# Patient Record
Sex: Male | Born: 1960
Health system: Southern US, Community
[De-identification: ages and names within clinical notes are randomized; demographics above are authoritative.]

## PROBLEM LIST (undated history)

## (undated) DIAGNOSIS — I2510 Atherosclerotic heart disease of native coronary artery without angina pectoris: Secondary | ICD-10-CM

## (undated) DIAGNOSIS — I5020 Unspecified systolic (congestive) heart failure: Secondary | ICD-10-CM

## (undated) DIAGNOSIS — M519 Unspecified thoracic, thoracolumbar and lumbosacral intervertebral disc disorder: Secondary | ICD-10-CM

## (undated) DIAGNOSIS — IMO0001 Reserved for inherently not codable concepts without codable children: Secondary | ICD-10-CM

## (undated) DIAGNOSIS — I219 Acute myocardial infarction, unspecified: Secondary | ICD-10-CM

## (undated) DIAGNOSIS — E785 Hyperlipidemia, unspecified: Secondary | ICD-10-CM

## (undated) DIAGNOSIS — I10 Essential (primary) hypertension: Secondary | ICD-10-CM

## (undated) DIAGNOSIS — F17200 Nicotine dependence, unspecified, uncomplicated: Secondary | ICD-10-CM

## (undated) HISTORY — PX: CARDIAC CATHETERIZATION: SHX172

## (undated) HISTORY — PX: LUMBAR LAMINECTOMY: SHX95

## (undated) HISTORY — PX: KNEE SURGERY: SHX244

---

## 1999-05-05 ENCOUNTER — Emergency Department (HOSPITAL_COMMUNITY): Admission: EM | Admit: 1999-05-05 | Discharge: 1999-05-05 | Payer: Self-pay | Admitting: Emergency Medicine

## 2001-09-30 ENCOUNTER — Emergency Department (HOSPITAL_COMMUNITY): Admission: EM | Admit: 2001-09-30 | Discharge: 2001-10-01 | Payer: Self-pay | Admitting: Emergency Medicine

## 2003-10-26 ENCOUNTER — Inpatient Hospital Stay (HOSPITAL_COMMUNITY): Admission: RE | Admit: 2003-10-26 | Discharge: 2003-10-28 | Payer: Self-pay | Admitting: Neurosurgery

## 2003-12-25 ENCOUNTER — Encounter: Admission: RE | Admit: 2003-12-25 | Discharge: 2003-12-25 | Payer: Self-pay | Admitting: Neurosurgery

## 2004-02-14 ENCOUNTER — Encounter: Admission: RE | Admit: 2004-02-14 | Discharge: 2004-02-14 | Payer: Self-pay | Admitting: Neurosurgery

## 2005-07-07 IMAGING — CR DG LUMBAR SPINE 2-3V
3 series · 3 of 3 positions shown · non-contrast
Comparison: none

CLINICAL DATA: Low back pain.  History of recent lumbar spine surgery 10/26/03. 
 THREE VIEW LUMBAR SPINE: 
 Comparison is to intraoperative and post operative films from 10/26/02.  There are posterior pedicle screws and rods fusing L4-5.  There is an interbody bone plug which appears partially receded into the L5 vertebral body which is a stable finding.  There is mild anterolisthesis of L4 compared to L3.  L4 and L5 are in good alignment.  There is disc space narrowing at L5-S1 and there are calcifications in the distal aorta noted.

[view not recorded (1 of 3)]
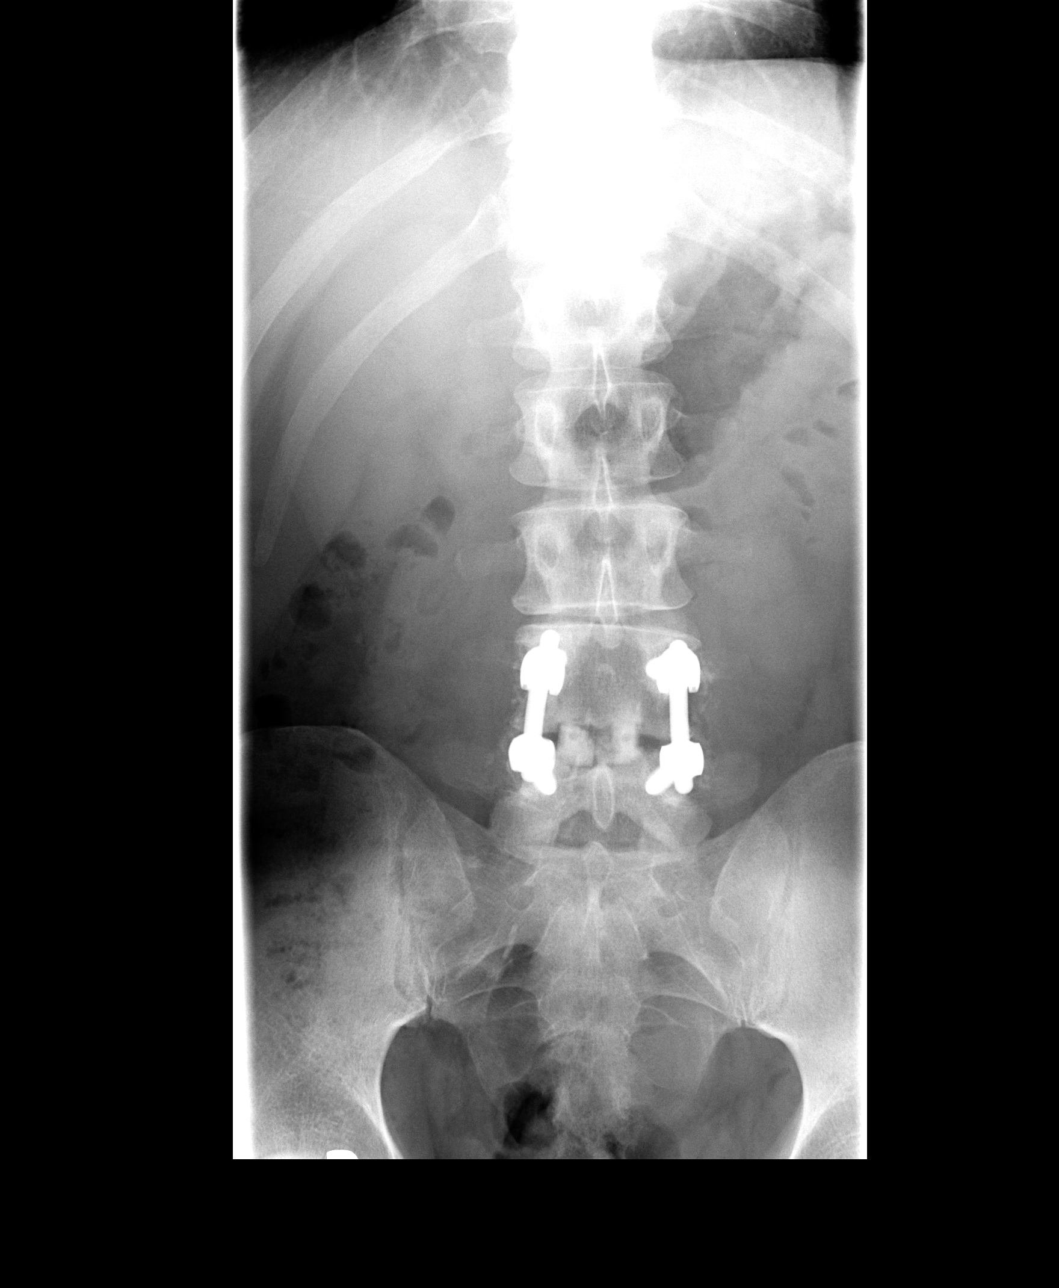

[view not recorded (2 of 3)]
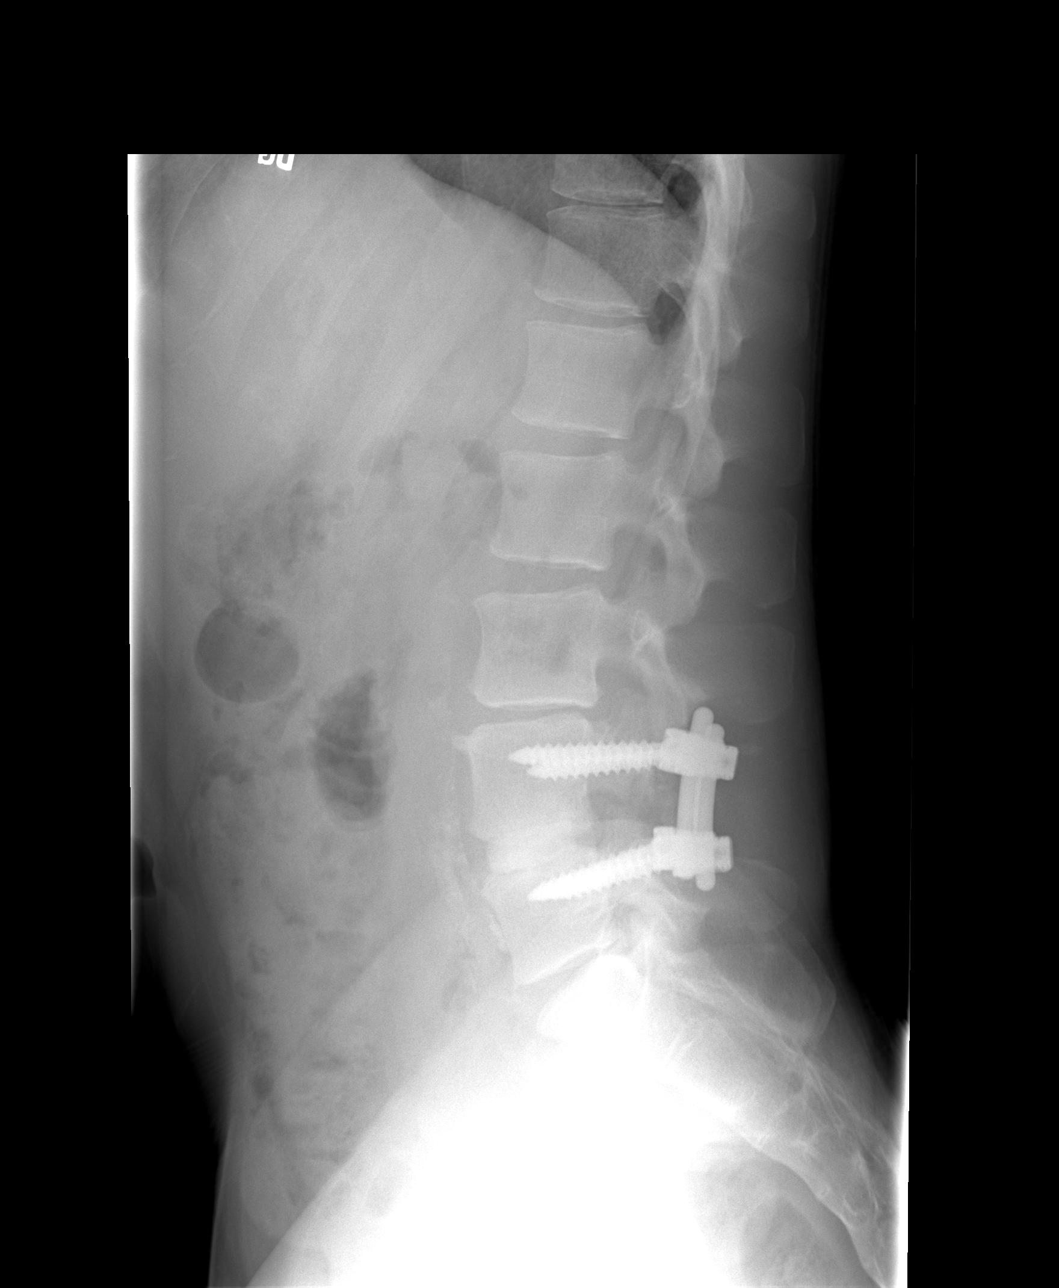

[view not recorded (3 of 3)]
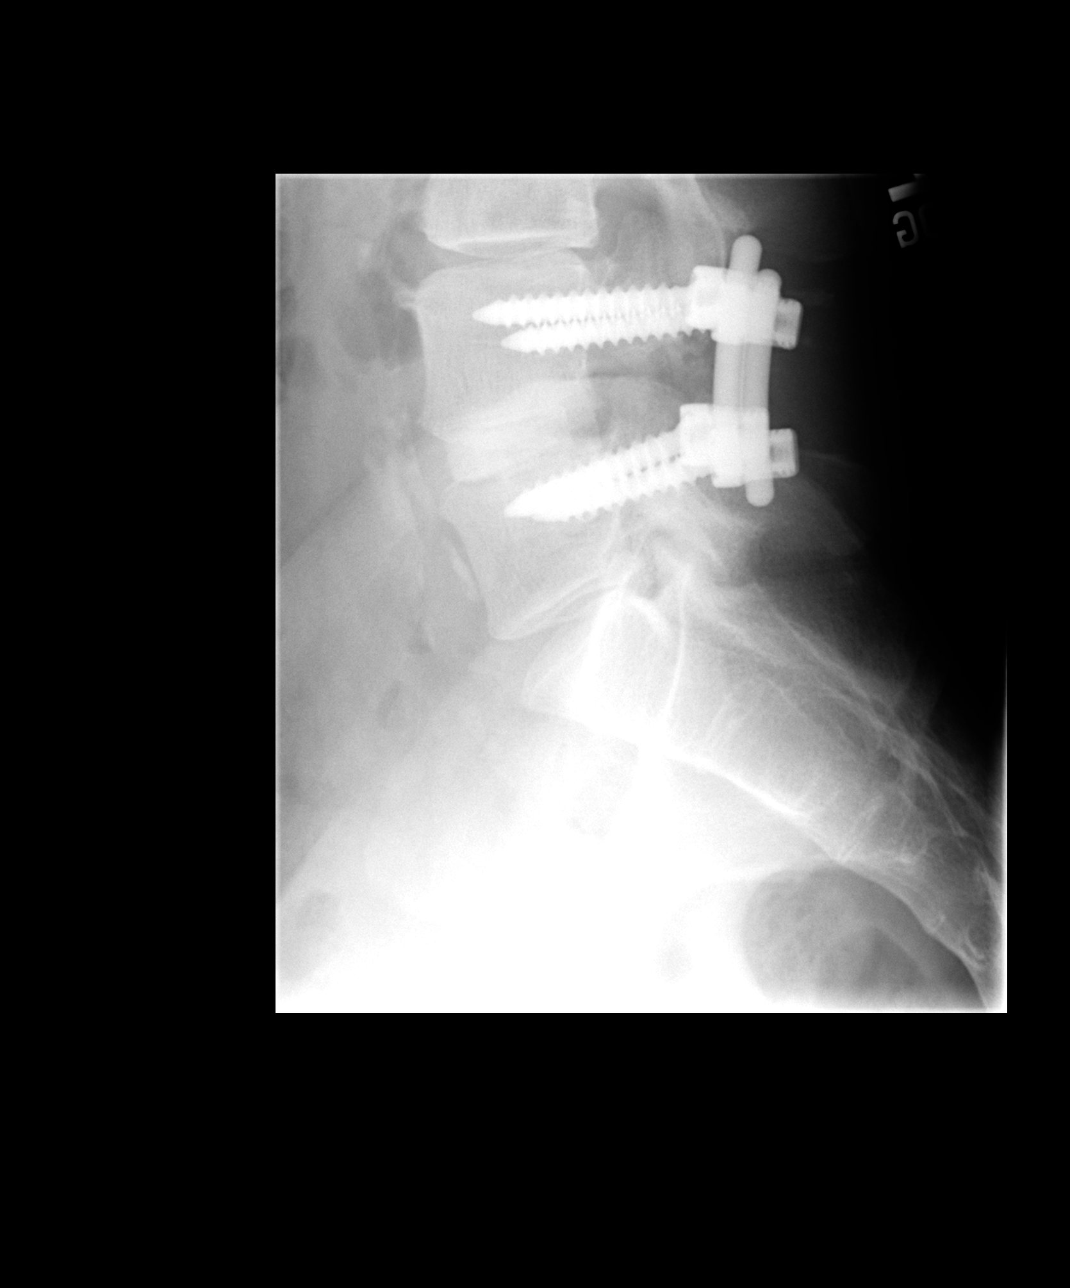

[3 of 3 positions shown; findings below may reference images not displayed]

IMPRESSION: 1.  L4-5 posterior fusion. 
 2.  Mild anterolisthesis of L4 compared to L3. 
 3.  Mild disc space narrowing at L3-4 and L5-S1.

## 2017-04-25 ENCOUNTER — Encounter (HOSPITAL_COMMUNITY): Payer: Self-pay | Admitting: Emergency Medicine

## 2017-04-25 ENCOUNTER — Encounter (HOSPITAL_COMMUNITY)
Admission: EM | Disposition: A | Discharge status: Home / Self Care | Payer: Self-pay | Source: Home / Self Care | Attending: Cardiovascular Disease

## 2017-04-25 ENCOUNTER — Emergency Department (HOSPITAL_COMMUNITY): Payer: Self-pay

## 2017-04-25 ENCOUNTER — Inpatient Hospital Stay (HOSPITAL_COMMUNITY)
Admission: EM | Admit: 2017-04-25 | Discharge: 2017-04-28 | DRG: 246 | Disposition: A | Discharge status: Home / Self Care | Payer: Self-pay | Attending: Cardiovascular Disease | Admitting: Cardiovascular Disease

## 2017-04-25 DIAGNOSIS — I10 Essential (primary) hypertension: Secondary | ICD-10-CM

## 2017-04-25 DIAGNOSIS — Z8249 Family history of ischemic heart disease and other diseases of the circulatory system: Secondary | ICD-10-CM

## 2017-04-25 DIAGNOSIS — I442 Atrioventricular block, complete: Secondary | ICD-10-CM | POA: Diagnosis present

## 2017-04-25 DIAGNOSIS — I2111 ST elevation (STEMI) myocardial infarction involving right coronary artery: Secondary | ICD-10-CM

## 2017-04-25 DIAGNOSIS — I110 Hypertensive heart disease with heart failure: Secondary | ICD-10-CM | POA: Diagnosis present

## 2017-04-25 DIAGNOSIS — E785 Hyperlipidemia, unspecified: Secondary | ICD-10-CM

## 2017-04-25 DIAGNOSIS — I2119 ST elevation (STEMI) myocardial infarction involving other coronary artery of inferior wall: Principal | ICD-10-CM | POA: Diagnosis present

## 2017-04-25 DIAGNOSIS — Z720 Tobacco use: Secondary | ICD-10-CM | POA: Diagnosis present

## 2017-04-25 DIAGNOSIS — Z880 Allergy status to penicillin: Secondary | ICD-10-CM

## 2017-04-25 DIAGNOSIS — R570 Cardiogenic shock: Secondary | ICD-10-CM

## 2017-04-25 DIAGNOSIS — I5021 Acute systolic (congestive) heart failure: Secondary | ICD-10-CM

## 2017-04-25 DIAGNOSIS — I219 Acute myocardial infarction, unspecified: Secondary | ICD-10-CM

## 2017-04-25 DIAGNOSIS — I2510 Atherosclerotic heart disease of native coronary artery without angina pectoris: Secondary | ICD-10-CM

## 2017-04-25 DIAGNOSIS — F17210 Nicotine dependence, cigarettes, uncomplicated: Secondary | ICD-10-CM | POA: Diagnosis present

## 2017-04-25 DIAGNOSIS — Z955 Presence of coronary angioplasty implant and graft: Secondary | ICD-10-CM

## 2017-04-25 HISTORY — PX: CORONARY/GRAFT ACUTE MI REVASCULARIZATION: CATH118305

## 2017-04-25 HISTORY — DX: Essential (primary) hypertension: I10

## 2017-04-25 HISTORY — DX: Acute myocardial infarction, unspecified: I21.9

## 2017-04-25 HISTORY — DX: Unspecified thoracic, thoracolumbar and lumbosacral intervertebral disc disorder: M51.9

## 2017-04-25 HISTORY — DX: Unspecified systolic (congestive) heart failure: I50.20

## 2017-04-25 HISTORY — DX: Reserved for inherently not codable concepts without codable children: IMO0001

## 2017-04-25 HISTORY — DX: Atherosclerotic heart disease of native coronary artery without angina pectoris: I25.10

## 2017-04-25 HISTORY — DX: Hyperlipidemia, unspecified: E78.5

## 2017-04-25 HISTORY — PX: LEFT HEART CATH AND CORONARY ANGIOGRAPHY: CATH118249

## 2017-04-25 HISTORY — DX: Nicotine dependence, unspecified, uncomplicated: F17.200

## 2017-04-25 LAB — I-STAT CHEM 8, ED
BUN: 21 mg/dL — AB (ref 6–20)
CHLORIDE: 104 mmol/L (ref 101–111)
CREATININE: 1.3 mg/dL — AB (ref 0.61–1.24)
Calcium, Ion: 1.06 mmol/L — ABNORMAL LOW (ref 1.15–1.40)
Glucose, Bld: 155 mg/dL — ABNORMAL HIGH (ref 65–99)
HEMATOCRIT: 52 % (ref 39.0–52.0)
Hemoglobin: 17.7 g/dL — ABNORMAL HIGH (ref 13.0–17.0)
POTASSIUM: 3.7 mmol/L (ref 3.5–5.1)
Sodium: 138 mmol/L (ref 135–145)
TCO2: 22 mmol/L (ref 22–32)

## 2017-04-25 LAB — POCT I-STAT TROPONIN I: Troponin i, poc: 0.01 ng/mL (ref 0.00–0.08)

## 2017-04-25 LAB — TROPONIN I: Troponin I: 20.8 ng/mL (ref <0.03)

## 2017-04-25 LAB — POCT ACTIVATED CLOTTING TIME: Activated Clotting Time: 681 seconds

## 2017-04-25 LAB — MRSA PCR SCREENING: MRSA BY PCR: NEGATIVE

## 2017-04-25 SURGERY — LEFT HEART CATH AND CORONARY ANGIOGRAPHY
Anesthesia: LOCAL

## 2017-04-25 MED ORDER — TICAGRELOR 90 MG PO TABS
ORAL_TABLET | ORAL | Status: AC
  Filled 2017-04-25: qty 2

## 2017-04-25 MED ORDER — SODIUM CHLORIDE 0.9 % IV SOLN
INTRAVENOUS | Status: AC | PRN
  Administered 2017-04-25: 1.75 mg/kg/h via INTRAVENOUS

## 2017-04-25 MED ORDER — BIVALIRUDIN TRIFLUOROACETATE 250 MG IV SOLR
INTRAVENOUS | Status: AC
  Filled 2017-04-25: qty 250

## 2017-04-25 MED ORDER — MORPHINE SULFATE (PF) 10 MG/ML IV SOLN: 2.0000 mg | INTRAVENOUS | Status: DC | PRN

## 2017-04-25 MED ORDER — HYDRALAZINE HCL 20 MG/ML IJ SOLN
5.0000 mg | INTRAMUSCULAR | Status: DC | PRN
  Administered 2017-04-25 (×2): 5 mg via INTRAVENOUS
  Filled 2017-04-25 (×2): qty 1

## 2017-04-25 MED ORDER — FENTANYL CITRATE (PF) 100 MCG/2ML IJ SOLN
INTRAMUSCULAR | Status: DC | PRN
  Administered 2017-04-25 (×2): 25 ug via INTRAVENOUS

## 2017-04-25 MED ORDER — SODIUM CHLORIDE 0.9 % IV SOLN
1.7500 mg/kg/h | INTRAVENOUS | Status: AC
  Administered 2017-04-25 (×2): 1.75 mg/kg/h via INTRAVENOUS
  Filled 2017-04-25: qty 250

## 2017-04-25 MED ORDER — IOPAMIDOL (ISOVUE-370) INJECTION 76%
INTRAVENOUS | Status: AC
  Filled 2017-04-25: qty 125

## 2017-04-25 MED ORDER — IOPAMIDOL (ISOVUE-370) INJECTION 76%
INTRAVENOUS | Status: DC | PRN
  Administered 2017-04-25: 90 mL via INTRA_ARTERIAL

## 2017-04-25 MED ORDER — LIDOCAINE HCL (PF) 1 % IJ SOLN
INTRAMUSCULAR | Status: AC
  Filled 2017-04-25: qty 30

## 2017-04-25 MED ORDER — TIROFIBAN HCL IN NACL 5-0.9 MG/100ML-% IV SOLN
INTRAVENOUS | Status: AC
  Filled 2017-04-25: qty 100

## 2017-04-25 MED ORDER — FENTANYL CITRATE (PF) 100 MCG/2ML IJ SOLN
INTRAMUSCULAR | Status: AC
  Filled 2017-04-25: qty 2

## 2017-04-25 MED ORDER — ATROPINE SULFATE 1 MG/ML IJ SOLN
1.0000 mg | Freq: Once | INTRAMUSCULAR | Status: AC
  Administered 2017-04-25: 1 mg via INTRAVENOUS

## 2017-04-25 MED ORDER — ACETAMINOPHEN 325 MG PO TABS: 650.0000 mg | ORAL_TABLET | ORAL | Status: DC | PRN

## 2017-04-25 MED ORDER — ONDANSETRON HCL 4 MG/2ML IJ SOLN: 4.0000 mg | Freq: Four times a day (QID) | INTRAMUSCULAR | Status: DC | PRN

## 2017-04-25 MED ORDER — SODIUM CHLORIDE 0.9 % IV SOLN
Freq: Once | INTRAVENOUS | Status: AC
  Administered 2017-04-25 (×2): via INTRAVENOUS

## 2017-04-25 MED ORDER — SODIUM CHLORIDE 0.9 % IV SOLN: Freq: Once | INTRAVENOUS | Status: DC

## 2017-04-25 MED ORDER — SODIUM CHLORIDE 0.9% FLUSH
3.0000 mL | Freq: Two times a day (BID) | INTRAVENOUS | Status: DC
  Administered 2017-04-25 – 2017-04-27 (×5): 3 mL via INTRAVENOUS

## 2017-04-25 MED ORDER — TICAGRELOR 90 MG PO TABS
90.0000 mg | ORAL_TABLET | Freq: Two times a day (BID) | ORAL | Status: DC
  Administered 2017-04-26: 90 mg via ORAL
  Filled 2017-04-25: qty 1

## 2017-04-25 MED ORDER — TIROFIBAN HCL IN NACL 5-0.9 MG/100ML-% IV SOLN
INTRAVENOUS | Status: DC | PRN
  Administered 2017-04-25: 0.075 ug/kg/min via INTRAVENOUS

## 2017-04-25 MED ORDER — HEPARIN (PORCINE) IN NACL 2-0.9 UNIT/ML-% IJ SOLN
INTRAMUSCULAR | Status: AC
  Filled 2017-04-25: qty 1000

## 2017-04-25 MED ORDER — NOREPINEPHRINE BITARTRATE 1 MG/ML IV SOLN
INTRAVENOUS | Status: AC
  Filled 2017-04-25: qty 4

## 2017-04-25 MED ORDER — HEPARIN SODIUM (PORCINE) 5000 UNIT/ML IJ SOLN
60.0000 [IU]/kg | Freq: Once | INTRAMUSCULAR | Status: AC
  Administered 2017-04-25: 4000 [IU] via INTRAVENOUS

## 2017-04-25 MED ORDER — HEPARIN (PORCINE) IN NACL 2-0.9 UNIT/ML-% IJ SOLN
INTRAMUSCULAR | Status: AC | PRN
  Administered 2017-04-25: 1000 mL

## 2017-04-25 MED ORDER — TIROFIBAN (AGGRASTAT) BOLUS VIA INFUSION
INTRAVENOUS | Status: DC | PRN
  Administered 2017-04-25: 1565 ug via INTRAVENOUS

## 2017-04-25 MED ORDER — LIDOCAINE HCL (PF) 1 % IJ SOLN
INTRAMUSCULAR | Status: DC | PRN
  Administered 2017-04-25: 15 mL

## 2017-04-25 MED ORDER — TICAGRELOR 90 MG PO TABS
ORAL_TABLET | ORAL | Status: DC | PRN
  Administered 2017-04-25: 180 mg via ORAL

## 2017-04-25 MED ORDER — NOREPINEPHRINE BITARTRATE 1 MG/ML IV SOLN
INTRAVENOUS | Status: DC | PRN
  Administered 2017-04-25: 5 ug/min via INTRAVENOUS

## 2017-04-25 MED ORDER — SODIUM CHLORIDE 0.9 % IV SOLN: INTRAVENOUS | Status: DC

## 2017-04-25 MED ORDER — SODIUM CHLORIDE 0.9% FLUSH
3.0000 mL | INTRAVENOUS | Status: DC | PRN
  Administered 2017-04-26: 3 mL via INTRAVENOUS
  Filled 2017-04-25: qty 3

## 2017-04-25 MED ORDER — BIVALIRUDIN BOLUS VIA INFUSION - CUPID
INTRAVENOUS | Status: DC | PRN
  Administered 2017-04-25: 46.95 mg via INTRAVENOUS

## 2017-04-25 MED ORDER — SODIUM CHLORIDE 0.9 % IV SOLN
Freq: Once | INTRAVENOUS | Status: AC
  Administered 2017-04-25: 18:00:00 via INTRAVENOUS

## 2017-04-25 MED ORDER — ASPIRIN 81 MG PO CHEW
81.0000 mg | CHEWABLE_TABLET | Freq: Every day | ORAL | Status: DC
  Administered 2017-04-26 – 2017-04-28 (×3): 81 mg via ORAL
  Filled 2017-04-25 (×3): qty 1

## 2017-04-25 MED ORDER — LABETALOL HCL 5 MG/ML IV SOLN
10.0000 mg | INTRAVENOUS | Status: DC | PRN
  Filled 2017-04-25: qty 4

## 2017-04-25 MED ORDER — SODIUM CHLORIDE 0.9 % IV SOLN: 250.0000 mL | INTRAVENOUS | Status: DC | PRN

## 2017-04-25 SURGICAL SUPPLY — 20 items
BALLN SAPPHIRE 2.0X12 (BALLOONS) ×2
BALLN SAPPHIRE ~~LOC~~ 3.25X15 (BALLOONS) ×2 IMPLANT
BALLOON SAPPHIRE 2.0X12 (BALLOONS) ×1 IMPLANT
CABLE ADAPT CONN TEMP 6FT (ADAPTER) ×2 IMPLANT
CATH EXTRAC PRONTO 5.5F 138CM (CATHETERS) ×2 IMPLANT
CATH INFINITI 5FR MULTPACK ANG (CATHETERS) ×2 IMPLANT
CATH S G BIP PACING (SET/KITS/TRAYS/PACK) ×2 IMPLANT
CATH VISTA GUIDE 6FR JR4 (CATHETERS) ×2 IMPLANT
KIT ENCORE 26 ADVANTAGE (KITS) ×2 IMPLANT
KIT HEART LEFT (KITS) ×2 IMPLANT
PACK CARDIAC CATHETERIZATION (CUSTOM PROCEDURE TRAY) ×2 IMPLANT
SHEATH PINNACLE 6F 10CM (SHEATH) ×4 IMPLANT
SLEEVE REPOSITIONING LENGTH 30 (MISCELLANEOUS) ×2 IMPLANT
STENT SYNERGY DES 3X20 (Permanent Stent) ×2 IMPLANT
SYR MEDRAD MARK V 150ML (SYRINGE) ×2 IMPLANT
TRANSDUCER W/STOPCOCK (MISCELLANEOUS) ×2 IMPLANT
TUBING CIL FLEX 10 FLL-RA (TUBING) ×2 IMPLANT
WIRE ASAHI PROWATER 180CM (WIRE) ×2 IMPLANT
WIRE EMERALD 3MM-J .035X150CM (WIRE) ×2 IMPLANT
WIRE INTUITION HYDRO ST. 180CM (WIRE) ×2 IMPLANT

## 2017-04-25 NOTE — ED Notes (Signed)
To CATH LAB now!!

## 2017-04-25 NOTE — ED Provider Notes (Addendum)
[facility]-EMERGENCY DEPT Provider Note   CSN: [id number] Arrival date & time: 04/25/17  1728     History   Chief Complaint No chief complaint on file.   HPI Lance Santiago is a 56 y.o. male. Chief complaint is chest pain, code STEMI.  HPI:  56 year old male. History of tobacco abuse with 40+ pack year smoking history, hypertension, and family history of heart disease with his father having an MI. He had onset of chest pain while at rest at 4:30 today while at home. He called 911. They arrived to find him pale. Living of dizziness. No syncope. EKG faxed to me. Shows obvious marked ST elevations inferiorly and her cervical changes laterally. Anterior changes consistent with posterior wall MI.   Prehospital medical direction: Per medics instructed to give fluid bolus and aspirin. Told to hold nitroglycerin due to the nature of this MI.  Past Medical History:  Diagnosis Date  . Hypertension   . Lumbar disc disease     There are no active problems to display for this patient.   Past Surgical History:  Procedure Laterality Date  . KNEE SURGERY    . LUMBAR LAMINECTOMY         Home Medications    Prior to Admission medications   Not on File    Family History Family History  Problem Relation Age of Onset  . Diabetes Mother   . Coronary artery disease Father     Social History Social History  Substance Use Topics  . Smoking status: Current Every Day Smoker    Packs/day: 1.00    Years: 40.00    Types: Cigarettes  . Smokeless tobacco: Never Used  . Alcohol use Not on file     Allergies   Penicillins   Review of Systems Review of Systems  Constitutional: Negative for appetite change, chills, diaphoresis, fatigue and fever.  HENT: Negative for mouth sores, sore throat and trouble swallowing.   Eyes: Negative for visual disturbance.  Respiratory: Negative for cough, chest tightness, shortness of breath and wheezing.   Cardiovascular: Positive for chest pain.  Negative for palpitations and leg swelling.  Gastrointestinal: Negative for abdominal distention, abdominal pain, diarrhea, nausea and vomiting.  Endocrine: Negative for polydipsia, polyphagia and polyuria.  Genitourinary: Negative for dysuria, frequency and hematuria.  Musculoskeletal: Negative for gait problem.  Skin: Negative for color change, pallor and rash.  Neurological: Positive for dizziness and light-headedness. Negative for syncope and headaches.  Hematological: Does not bruise/bleed easily.  Psychiatric/Behavioral: Negative for behavioral problems and confusion.     Physical Exam Updated Vital Signs BP (!) 84/50   Pulse (!) 44   Temp (!) 95.7 F (35.4 C) (Temporal)   Resp 15   Ht 5\' 7"  (1.702 m)   Wt 62.6 kg (138 lb)   SpO2 91%   BMI 21.61 kg/m   Physical Exam  Constitutional: He is oriented to person, place, and time. No distress.  Thin, pale, diaphoretic.  HENT:  Head: Normocephalic.  Eyes: Pupils are equal, round, and reactive to light. Conjunctivae are normal. No scleral icterus.  Neck: Normal range of motion. Neck supple. No thyromegaly present.  Cardiovascular: Normal rate and regular rhythm.  Exam reveals no gallop and no friction rub.   No murmur heard. No murmur  Pulmonary/Chest: Effort normal and breath sounds normal. No respiratory distress. He has no wheezes. He has no rales.  Clear lungs.  Abdominal: Soft. Bowel sounds are normal. He exhibits no distension. There is no tenderness. There  is no rebound.  Musculoskeletal: Normal range of motion.  Neurological: He is alert and oriented to person, place, and time.  Skin: Skin is warm and dry. No rash noted.  Psychiatric: He has a normal mood and affect. His behavior is normal.     ED Treatments / Results  Labs (all labs ordered are listed, but only abnormal results are displayed) Labs Reviewed  I-STAT CHEM 8, ED - Abnormal; Notable for the following:       Result Value   BUN 21 (*)     Creatinine, Ser 1.30 (*)    Glucose, Bld 155 (*)    Calcium, Ion 1.06 (*)    Hemoglobin 17.7 (*)    All other components within normal limits  CBC WITH DIFFERENTIAL/PLATELET  PROTIME-INR  APTT  COMPREHENSIVE METABOLIC PANEL  TROPONIN I  LIPID PANEL  POCT I-STAT TROPONIN I  I-STAT TROPONIN, ED    EKG  EKG Interpretation None       Radiology No results found.  Procedures Procedures (including critical care time)  Medications Ordered in ED Medications  0.9 %  sodium chloride infusion ( Intravenous Automatically Held 04/25/17 1800)  norepinephrine (LEVOPHED) 4 mg in dextrose 5 % 250 mL (0.016 mg/mL) infusion (5 mcg/min Intravenous New Bag/Given 04/25/17 1801)  lidocaine (PF) (XYLOCAINE) 1 % injection (15 mLs  Given 04/25/17 1804)  heparin injection 3,750 Units (4,000 Units Intravenous Given 04/25/17 1746)  0.9 %  sodium chloride infusion ( Intravenous Stopped 04/25/17 1748)  atropine injection 1 mg (1 mg Intravenous Given 04/25/17 1746)  0.9 %  sodium chloride infusion ( Intravenous New Bag/Given 04/25/17 1748)     Initial Impression / Assessment and Plan / ED Course  I have reviewed the triage vital signs and the nursing notes.  Pertinent labs & imaging results that were available during my care of the patient were reviewed by me and considered in my medical decision making (see chart for details).   EKG shows marked STE in inferior leads, Also STD in Ant and Lateral lead. Third-degree AV block. He may be upon arrival is 6. Patient's at 500 fluid. Given pressure bags an additional 3 L fluid in the ER.  Patient was given aspirin 324 mg prehospital. He had emesis. This was repeated and he has not had additional emesis after Zofran. Given 4 Zofran and morphine 4 mg prior to arrival.  Was given 4000 units IV push heparin here. Given 1 mg atropine IV at the request of Dr. Gery Pray cardiology. No change in heart rate.  Has palpable femoral pulse upon arrival. Maintains consciousness  here is able to communicate. Taken to the Cath Lab by nursing, Dr. Gery Pray, and Dr. Donnie Aho.  CRITICAL CARE Performed by: Rolland Porter JOSEPH   Total critical care time: 20 minutes  Critical care time was exclusive of separately billable procedures and treating other patients.  Critical care was necessary to treat or prevent imminent or life-threatening deterioration.  Critical care was time spent personally by me on the following activities: development of treatment plan with patient and/or surrogate as well as nursing, discussions with consultants, evaluation of patient's response to treatment, examination of patient, obtaining history from patient or surrogate, ordering and performing treatments and interventions, ordering and review of laboratory studies, ordering and review of radiographic studies, pulse oximetry and re-evaluation of patient's condition.   Final Clinical Impressions(s) / ED Diagnoses   Final diagnoses:  STEMI involving right coronary artery (HCC)  Third degree AV block (HCC)  New Prescriptions There are no discharge medications for this patient.    Rolland Porter, MD 04/25/17 Julio Sicks    Rolland Porter, MD 04/25/17 [phone]

## 2017-04-25 NOTE — H&P (Addendum)
History and Physical   Admit date: 04/25/2017 Name:  Lance Santiago Medical record number: [id number] DOB/Age:  1961/04/04  56 y.o. male  Referring Physician:   [name]   Primary Cardiologist:  New to [facility]  Primary Physician:  None currently   Chief complaint/reason for admission: Chest pain   HPI:  This 56 year old male is new to the system and began having substernal chest discomfort approximately an hour ago and was transported to [facility].  An EKG in route shows an acute inferolateral myocardial infarction with ST depression in the high lateral and V2 leads.  He is having ongoing chest pain at the time of admission.  He did not have pain previous to this.  His history is remarkable for family history hypertension as well as smoking.  He was given atropine in the emergency room without effect on his heart rate.   Past Medical History:  Diagnosis Date  . Hypertension   . Lumbar disc disease      Past Surgical History:  Procedure Laterality Date  . KNEE SURGERY    . LUMBAR LAMINECTOMY     Allergies: is allergic to penicillins.   Medications: Prior to Admission medications   He says he is on lisinopril dose unknown  Family History:  family history includes Coronary artery disease in his father; Diabetes in his mother.  Social History:   reports that he has been smoking Cigarettes.  He has a 40.00 pack-year smoking history. He has never used smokeless tobacco.   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  He has adult children and is currently living with a girlfriend.    Review of Systems: Only cursory review of systems able to be done due to the acute nature of the problem.  No history of GI bleeding. Other than as noted above, the remainder of the review of systems is normal  Physical Exam: BP (!) 84/50   Pulse (!) 44   Temp (!) 95.7 F (35.4 C) (Temporal)   Resp 15   Ht 5\' 7"  (1.702 m)   Wt 62.6 kg  (138 lb)   SpO2 91%   BMI 21.61 kg/m    General appearance: He is a dark complected male mildly diaphoretic complaining of chest pain appears ill Head: Normocephalic, without obvious abnormality, atraumatic Eyes: negative  Mouth: Teeth in poor repair Neck: no adenopathy, no carotid bruit, supple, symmetrical, trachea midline and JVD difficult to assess in the supine position Lungs: clear to auscultation bilaterally Heart: Slow regular rhythm, normal S1-S2 no S3 no murmur Abdomen: soft, non-tender; bowel sounds normal; no masses,  no organomegaly Rectal: deferred Extremities: Extremities somewhat cool, no edema, full range of motion Pulses: 1+ femoral and peripheral pulses were palpable Skin: [name] Neurologic: Somewhat dark complexion around the head and neck area  Labs: Pending at the time of admission  EKG: Acute inferior and lateral infarction with significant sinus bradycardia and reciprocal ST depression in the lateral leads Independently reviewed by me  Radiology: Not obtained on admission   IMPRESSIONS: 1.  Acute inferior infarction with hypotension and bradycardia with cardiogenic shock 2.  Hypertension 3.  Tobacco abuse  PLAN: To the cath lab for acute intervention in the setting of an acute infarction.  Further plans by Dr. Allyson Sabal.  Signed: Darden Palmer MD [facility] Cardiology  04/25/2017, 5:58 PM

## 2017-04-25 NOTE — ED Triage Notes (Signed)
Patient arrived by [facility] activated as Code Stemi. Reports chest pain and shortness of breath x 1 hour prior to calling EMS. Patient received 500NS bolus, aspirin 324mg , morphine 4mg  and zofran 4mg  pta. Pain 9/10 on arrival, pale. Alert and oriented. No cardiac hx

## 2017-04-25 NOTE — ED Notes (Signed)
Patient received total of 3500 of NS in ED

## 2017-04-25 NOTE — Progress Notes (Addendum)
CRITICAL VALUE ALERT  Critical Value:  Troponin 20.80  Date & Time Notied:  04/25/17 @ 2059  Provider Notified: Cardiology Donnie Aho) @ 2115  Orders Received/Actions taken: Pt has undergone cath, with current Angiomax gtt.  Continue with current plan of care.Marland KitchenMarland Kitchen

## 2017-04-25 NOTE — Progress Notes (Addendum)
Upon arrival arrival to unit at 1930, pt found to have level "2" hematoma and continuous bleeding at R femoral site.  Manual pressure held for 40 minutes.  Hematoma decreased, but oozing at site continues.  Pt A&Ox4 and  VS stable.  Per MD, continue Anngiomax gtt and pressure dressing applied.  Will continue to monitor pt closely and apply additional pressure as needed.  Frutoso Chase, RN 04/25/17

## 2017-04-26 ENCOUNTER — Encounter (HOSPITAL_COMMUNITY): Payer: Self-pay | Admitting: *Deleted

## 2017-04-26 ENCOUNTER — Inpatient Hospital Stay (HOSPITAL_COMMUNITY): Payer: Self-pay

## 2017-04-26 DIAGNOSIS — R570 Cardiogenic shock: Secondary | ICD-10-CM | POA: Diagnosis present

## 2017-04-26 DIAGNOSIS — Z720 Tobacco use: Secondary | ICD-10-CM

## 2017-04-26 DIAGNOSIS — I219 Acute myocardial infarction, unspecified: Secondary | ICD-10-CM

## 2017-04-26 DIAGNOSIS — I442 Atrioventricular block, complete: Secondary | ICD-10-CM

## 2017-04-26 HISTORY — DX: Tobacco use: Z72.0

## 2017-04-26 LAB — TROPONIN I: Troponin I: >65 ng/mL (ref <0.03)

## 2017-04-26 LAB — LIPID PANEL
Cholesterol: 199 mg/dL (ref 0–200)
HDL: 29 mg/dL — AB (ref >40)
LDL Cholesterol: 120 mg/dL — ABNORMAL HIGH (ref 0–99)
TRIGLYCERIDES: 250 mg/dL — AB (ref <150)
Total CHOL/HDL Ratio: 6.9 RATIO
VLDL: 50 mg/dL — ABNORMAL HIGH (ref 0–40)

## 2017-04-26 LAB — CBC
HEMATOCRIT: 40.7 % (ref 39.0–52.0)
HEMOGLOBIN: 13.8 g/dL (ref 13.0–17.0)
MCH: 31.2 pg (ref 26.0–34.0)
MCHC: 33.9 g/dL (ref 30.0–36.0)
MCV: 92.1 fL (ref 78.0–100.0)
Platelets: 169 10*3/uL (ref 150–400)
RBC: 4.42 MIL/uL (ref 4.22–5.81)
RDW: 14.1 % (ref 11.5–15.5)
WBC: 14.7 10*3/uL — AB (ref 4.0–10.5)

## 2017-04-26 LAB — BASIC METABOLIC PANEL
ANION GAP: 5 (ref 5–15)
BUN: 13 mg/dL (ref 6–20)
CHLORIDE: 108 mmol/L (ref 101–111)
CO2: 23 mmol/L (ref 22–32)
Calcium: 8.4 mg/dL — ABNORMAL LOW (ref 8.9–10.3)
Creatinine, Ser: 1.11 mg/dL (ref 0.61–1.24)
GFR calc Af Amer: >60 mL/min (ref >60)
GLUCOSE: 93 mg/dL (ref 65–99)
POTASSIUM: 3.9 mmol/L (ref 3.5–5.1)
Sodium: 136 mmol/L (ref 135–145)

## 2017-04-26 LAB — ECHOCARDIOGRAM COMPLETE
HEIGHTINCHES: 67 in
Weight: 2156.8 oz

## 2017-04-26 LAB — HEMOGLOBIN A1C
Hgb A1c MFr Bld: 5.7 % — ABNORMAL HIGH (ref 4.8–5.6)
Mean Plasma Glucose: 116.89 mg/dL

## 2017-04-26 MED ORDER — ATROPINE SULFATE 1 MG/10ML IJ SOSY
PREFILLED_SYRINGE | INTRAMUSCULAR | Status: AC
  Filled 2017-04-26: qty 10

## 2017-04-26 MED ORDER — CLOPIDOGREL BISULFATE 75 MG PO TABS
75.0000 mg | ORAL_TABLET | Freq: Every day | ORAL | Status: DC
  Administered 2017-04-27 – 2017-04-28 (×2): 75 mg via ORAL
  Filled 2017-04-26 (×2): qty 1

## 2017-04-26 MED ORDER — ATORVASTATIN CALCIUM 80 MG PO TABS
80.0000 mg | ORAL_TABLET | Freq: Every day | ORAL | Status: DC
  Administered 2017-04-26 – 2017-04-27 (×2): 80 mg via ORAL
  Filled 2017-04-26 (×2): qty 1

## 2017-04-26 MED ORDER — CLOPIDOGREL BISULFATE 300 MG PO TABS
300.0000 mg | ORAL_TABLET | Freq: Once | ORAL | Status: AC
Start: 2017-04-26 — End: 2017-04-26
  Administered 2017-04-26: 300 mg via ORAL
  Filled 2017-04-26: qty 1

## 2017-04-26 MED ORDER — HYDRALAZINE HCL 20 MG/ML IJ SOLN
10.0000 mg | Freq: Once | INTRAMUSCULAR | Status: AC
  Administered 2017-04-26: 10 mg via INTRAVENOUS

## 2017-04-26 MED ORDER — NITROGLYCERIN IN D5W 200-5 MCG/ML-% IV SOLN: 0.0000 ug/min | INTRAVENOUS | Status: DC

## 2017-04-26 NOTE — Progress Notes (Signed)
CARDIAC REHAB PHASE I   PRE:  Rate/Rhythm: 91 SR  BP:  Sitting: 125/80        SaO2: 96 RA  MODE:  Ambulation: 370 ft   POST:  Rate/Rhythm: 95 SR  BP:  Sitting: 133/91         SaO2: 97 RA  Pt ambulated 370 ft on RA, handheld assist, steady gait, tolerated well with no complaints. Began MI/stent education.  Reviewed risk factors, tobacco cessation (declined fake cigarette), MI book, anti-platelet therapy, stent card, activity restrictions, ntg, and phase 2 cardiac rehab. Left heart healthy diet handout for to review. Pt verbalized understanding. Pt agrees to phase 2 cardiac rehab referral, will send to [place] per pt request. Pt states he does not have insurance, states if his medication is not free he will not be able to afford it. Pt also does not have PCP. Pt to see case manager prior to discharge. Pt to recliner after walk, call bell within reach. Will follow.   [phone] Joylene Grapes, RN, BSN 04/26/2017 3:24 PM

## 2017-04-26 NOTE — Progress Notes (Signed)
Progress Note  Patient Name: Lance Santiago Date of Encounter: 04/26/2017  Primary Cardiologist: Quay Burow MD  Subjective   Feels great- denies any chest pain or SOB.   Inpatient Medications    Scheduled Meds: . aspirin  81 mg Oral Daily  . atorvastatin  80 mg Oral q1800  . clopidogrel  300 mg Oral Once  . [START ON 04/27/2017] clopidogrel  75 mg Oral Daily  . sodium chloride flush  3 mL Intravenous Q12H   Continuous Infusions: . sodium chloride     PRN Meds: sodium chloride, acetaminophen, morphine injection, ondansetron (ZOFRAN) IV, sodium chloride flush   Vital Signs    Vitals:   04/26/17 0744 04/26/17 0800 04/26/17 0830 04/26/17 0900  BP:  119/73 115/79 116/82  Pulse:  78 69 77  Resp:  _0 Temp: 98.7 F (37.1 C)     TempSrc: Oral     SpO2:  96% 96% 97%  Weight:      Height:        Intake/Output Summary (Last 24 hours) at 04/26/17 0947 Last data filed at 04/26/17 0730  Gross per 24 hour  Intake          1072.11 ml  Output              850 ml  Net           222.11 ml   Filed Weights   04/25/17 1739 04/26/17 0500  Weight: 138 lb (62.6 kg) 134 lb 12.8 oz (61.1 kg)    Telemetry    NSR. No pacing. No further heart block - Personally Reviewed  ECG    NSR with inferior infarct. ST elevation resolving. - Personally Reviewed  Physical Exam   GEN: No acute distress.   Neck: No JVD or bruits Cardiac: RRR, no murmurs, rubs, or gallops.  Respiratory: Clear to auscultation bilaterally. GI: Soft, nontender, non-distended  MS: No edema; No deformity. Sheath with temp pacer in right groin. No hematoma. Neuro:  Nonfocal  Psych: Normal affect   Labs    Chemistry Recent Labs Lab 04/25/17 1752 04/26/17 0656  NA 138 136  K 3.7 3.9  CL 104 108  CO2  --  23  GLUCOSE 155* 93  BUN 21* 13  CREATININE 1.30* 1.11  CALCIUM  --  8.4*  GFRNONAA  --  >60  GFRAA  --  >60  ANIONGAP  --  5     Hematology Recent Labs Lab 04/25/17 1752  04/26/17 0656  WBC  --  14.7*  RBC  --  4.42  HGB 17.7* 13.8  HCT 52.0 40.7  MCV  --  92.1  MCH  --  31.2  MCHC  --  33.9  RDW  --  14.1  PLT  --  169    Cardiac Enzymes Recent Labs Lab 04/25/17 1950 04/26/17 0050 04/26/17 0656  TROPONINI 20.80* >65.00* >65.00*    Recent Labs Lab 04/25/17 1750  TROPIPOC 0.01     BNPNo results for input(s): BNP, PROBNP in the last 168 hours.   DDimer No results for input(s): DDIMER in the last 168 hours.   Radiology    No results found.  Cardiac Studies   Conclusion     Prox RCA to Mid RCA lesion, 100 %stenosed.  There is mild left ventricular systolic dysfunction.  The left ventricular ejection fraction is 45-50% by visual estimate.  LV end diastolic pressure is moderately elevated.   Lance Santiago  is a 56 y.o. male    [id number] LOCATION:  FACILITY: [facility]  PHYSICIAN: Quay Burow, M.D. 27-Nov-1960   DATE OF PROCEDURE:  04/25/2017  DATE OF DISCHARGE:     CARDIAC CATHETERIZATION / RCA-DES/ TTVPM     History obtained from chart review. Lance Santiago is a 56 year old thin appearing single Caucasian male without prior cardiac history. He does have a history of hypertension and tobacco abuse. He developed sudden onset of chest pain approximately one hour prior to presentation. EMS was called. His EKG showed inferior ST segment elevation with extension to lateral leads and severe bradycardia/heart block. Upon presentation his heart rate was in the mid 30s and his blood pressure was 70. He was brought emergently to the Cath Lab for stabilization, angiography and intervention.   PROCEDURE DESCRIPTION:   The patient was brought to the second floor [place] [facility]iac cath lab in the postabsorptive state. He was not premedicated . His right groin was prepped and shaved in usual sterile fashion. Xylocaine 1% was used for local anesthesia. A 6 French sheath was inserted into the right common femoral  arteryAnd a 6 French sheath was inserted into the right common femoral vein using standard Seldinger technique. A balloon tipped temporary transverse pacemaker was placed in the RV apex with thresholds measured down to less than 5 mA. This was placed at 60 bpm. He was placed on Levothroid for hypotension. Once his heart rate improved to 60's blood pressure did as well. 5 French left Judkins diagnostic catheters along with a 5 French pigtail catheter were used for selective coronary angiography and left ventriculography respectively. Isovue dye was used for the entirety of the case. Retrograde aortic, left ventricular and pullback pressures were recorded.  The patient received Angiomax bolus followed by infusion with a therapeutic ACT demonstrated. He received Proventil 180 mg by mouth load. Isovue was used for the entirety of the intervention. Retrograde pressure was monitored in the case. I fairly easily crossed the lesion with an 014 guidewire and predilated with a 2 mm x 12 mm balloon restoring antegrade flow with a door to balloon time of 20 minutes. There was a large thrombus burden and therefore used a Pronto aspiration thrombectomy catheter removed a copious amount of white thrombus. This markedly improved antegrade flow. In addition, I gave a bolus of Aggrastat. I then stented the lesion with a 3 mm x 20 mm long synergy drug-eluting stent delivered at 18 atm and postdilated with 3.5 x 15 mm long noncompliant balloon at 18 atm (3.4 mm) resulting in reduction of the total occlusion to 0% residual with TIMI-3 flow. There was no visible thrombus. The patient tolerated the procedure well. He was somewhat tachycardic during this time and hypertensive and therefore his Levophed was discontinued. The guidewire and catheter were removed. The sheaths including the temporary transvenous pacemaker was secured in place.   IMPRESSION: Successful RCA PCI and drug-eluting stenting in the setting of a large inferior  wall myocardial infarction. By complete heart block, bradycardia and cardiogenic shock. He was treated with aspiration thrombectomy followed by drug eluding stenting using a synergy millimeter by 20 mm long drug-eluting stent postdilated to 3.4 mm. The door to  to balloon time was 20 minutes. Once antegrade flow was restored blood pressure improved as did his heart rate. Aggrastat was discontinued. Angiomax will continue for 4 hours at full dose. The femoral arterial sheath will be removed. I will maintain the femoral venous sheath and pacemaker until the morning in case  he becomes bradycardic overnight. His EF was in the 45-50% range. He can be fast tracked the most likely discharged on Tuesday or Wednesday. He will be treated with appropriate/usual post-MI pharmacology including low-dose beta blocker, antiplatelet therapy which he will continue for a minimum of 12 months and high-dose statin therapy. He left the lab in stable condition.  Quay Burow. MD, [facility] 04/25/2017 6:53 PM      Indications   Acute ST elevation myocardial infarction (STEMI) involving other coronary artery of inferior wall (HCC) [I21.19 (ICD-10-CM)]  Third degree heart block ([place]) [I44.2 (ICD-10-CM)]  Cardiogenic shock ([place]) [R57.0 (ICD-10-CM)]  Procedural Details/Technique   Technical Details Estimated blood loss <50 mL.  During this procedure no sedation was administered.    Coronary Findings   Dominance: Right  Right Coronary Artery  Prox RCA to Mid RCA lesion, 100% stenosed.  Right Posterior Descending Artery  RPDA filled by collaterals from Mid LAD.  Wall Motion              Left Heart   Left Ventricle The left ventricular size is normal. There is mild left ventricular systolic dysfunction. LV end diastolic pressure is moderately elevated. The left ventricular ejection fraction is 45-50% by visual estimate. There are LV function abnormalities. Mild inferior hypokinesia    Coronary Diagrams    Diagnostic Diagram       Implan    Patient Profile     56 y.o. male with history of tobacco abuse and family history of CAD presents with acute inferior STEMI complicated by cardiogenic shock and CHB.  Assessment & Plan    1. Acute inferior STEMI complicated by cardiogenic shock and CHB. S/p emergent stenting of the RCA with good result. No significant disease in LCA. Troponin peak >65. Ecg improved. Asymptomatic. CHB resolved. BP normal. Will check EF by Echo. Remove temp pacer today. Ambulate. Will switch Brilinta to Plavix since patient has no insurance and is unemployed. On ASA and statin. Hold beta blocker due to CHB. Consider ACEi depending on results of Echo. Case management to assist with medications.  2. Tobacco abuse - patient plans to quit.  3. Family history of CAD 4. Lipid status unknown. Check lipid panel and A1c.  For questions or updates, please contact [facility] [place] Please consult [email or web address] for contact info under Cardiology/STEMI.      Signed, Glennis Montenegro [place], MD  04/26/2017, 9:47 AM

## 2017-04-26 NOTE — Progress Notes (Signed)
  Echocardiogram 2D Echocardiogram has been performed.  Celene Skeen 04/26/2017, 11:42 AM

## 2017-04-26 NOTE — Progress Notes (Signed)
Venous sheath removed.  Manual pressure held for 15 minutes.  Site unremarkable, level 0.  Tegaderm dressing applied to site.  Pt educated on restrictions and bedrest.  Will continue to monitor pt closely.

## 2017-04-26 NOTE — Progress Notes (Signed)
Right femoral sheath pulled at 0205. Manual pressure applied for 25 minutes. Two RNs and atropine at bedside. VS stable throughout. Site level "1." ?   Pt educated on mobility restrictions and potential complications, including when to contact RN.   Pt tolerated procedure well. Will continue to monitor pt and site closely. ?  ----   Site area: Right Femoral Artery   Site Prior to Removal: Level 2 - oozing at insertion site   Pressure Applied For: 25 Mins   Manual: Yes  Patient Status During Pull: Stable   Post Pull Site: Level 1 - mnimal bruising    Post Pull Instructions Given: Yes   Post Pull Pulses Present: DP +1   Dressing Applied: Gauze and pressure tape  Bedrest begins @ 0230

## 2017-04-26 NOTE — Progress Notes (Signed)
CRITICAL VALUE ALERT  Critical Value: Troponin I >65  Date & Time Notied: 04/26/2017  1229  Provider Notified: not notified, expected value  Orders Received/Actions taken: no orders received, expected value post STEMI

## 2017-04-27 DIAGNOSIS — R570 Cardiogenic shock: Secondary | ICD-10-CM

## 2017-04-27 DIAGNOSIS — I2119 ST elevation (STEMI) myocardial infarction involving other coronary artery of inferior wall: Principal | ICD-10-CM

## 2017-04-27 LAB — BASIC METABOLIC PANEL
Anion gap: 5 (ref 5–15)
BUN: 15 mg/dL (ref 6–20)
CHLORIDE: 108 mmol/L (ref 101–111)
CO2: 23 mmol/L (ref 22–32)
Calcium: 8.8 mg/dL — ABNORMAL LOW (ref 8.9–10.3)
Creatinine, Ser: 1.02 mg/dL (ref 0.61–1.24)
GFR calc non Af Amer: >60 mL/min (ref >60)
Glucose, Bld: 92 mg/dL (ref 65–99)
POTASSIUM: 4 mmol/L (ref 3.5–5.1)
SODIUM: 136 mmol/L (ref 135–145)

## 2017-04-27 MED ORDER — LISINOPRIL 10 MG PO TABS
10.0000 mg | ORAL_TABLET | Freq: Every day | ORAL | Status: DC
  Administered 2017-04-27 – 2017-04-28 (×2): 10 mg via ORAL
  Filled 2017-04-27 (×2): qty 1

## 2017-04-27 MED ORDER — NICOTINE 21 MG/24HR TD PT24
21.0000 mg | MEDICATED_PATCH | Freq: Every day | TRANSDERMAL | Status: DC
  Administered 2017-04-27 – 2017-04-28 (×2): 21 mg via TRANSDERMAL
  Filled 2017-04-27 (×2): qty 1

## 2017-04-27 MED FILL — Medication: Qty: 1 | Status: AC

## 2017-04-27 NOTE — Progress Notes (Signed)
Progress Note  Patient Name: Lance Santiago Date of Encounter: 04/27/2017  Primary Cardiologist: Quay Burow MD  Subjective   Feels great- denies any chest pain or SOB. Ambulating in halls.  Inpatient Medications    Scheduled Meds: . aspirin  81 mg Oral Daily  . atorvastatin  80 mg Oral q1800  . clopidogrel  75 mg Oral Daily  . nicotine  21 mg Transdermal Daily  . sodium chloride flush  3 mL Intravenous Q12H   Continuous Infusions: . sodium chloride     PRN Meds: sodium chloride, acetaminophen, morphine injection, ondansetron (ZOFRAN) IV, sodium chloride flush   Vital Signs    Vitals:   04/27/17 0700 04/27/17 0910 04/27/17 1100 04/27/17 1152  BP:  (!) 133/92 (!) 128/92   Pulse: 98 98 92   Resp: 19 (!) 25 (!) 21   Temp:  98.1 F (36.7 C)  97.9 F (36.6 C)  TempSrc:  Oral  Oral  SpO2: 96% 91% 97%   Weight:      Height:        Intake/Output Summary (Last 24 hours) at 04/27/17 1351 Last data filed at 04/27/17 1100  Gross per 24 hour  Intake              240 ml  Output             1525 ml  Net            -1285 ml   Filed Weights   04/25/17 1739 04/26/17 0500  Weight: 138 lb (62.6 kg) 134 lb 12.8 oz (61.1 kg)    Telemetry    NSR. No pacing. No further heart block - Personally Reviewed  ECG    None today. - Personally Reviewed  Physical Exam   GEN: No acute distress.   Neck: No JVD or bruits Cardiac: RRR, no murmurs, rubs, or gallops.  Respiratory: Clear to auscultation bilaterally. GI: Soft, nontender, non-distended  MS: No edema; No deformity. No groin hematoma. Neuro:  Nonfocal  Psych: Normal affect   Labs    Chemistry  Recent Labs Lab 04/25/17 1752 04/26/17 0656 04/27/17 0243  NA 138 136 136  K 3.7 3.9 4.0  CL 104 108 108  CO2  --  23 23  GLUCOSE 155* 93 92  BUN 21* 13 15  CREATININE 1.30* 1.11 1.02  CALCIUM  --  8.4* 8.8*  GFRNONAA  --  >60 >60  GFRAA  --  >60 >60  ANIONGAP  --  5 5     Hematology  Recent  Labs Lab 04/25/17 1752 04/26/17 0656  WBC  --  14.7*  RBC  --  4.42  HGB 17.7* 13.8  HCT 52.0 40.7  MCV  --  92.1  MCH  --  31.2  MCHC  --  33.9  RDW  --  14.1  PLT  --  169    Cardiac Enzymes  Recent Labs Lab 04/25/17 1950 04/26/17 0050 04/26/17 0656  TROPONINI 20.80* >65.00* >65.00*     Recent Labs Lab 04/25/17 1750  TROPIPOC 0.01     BNPNo results for input(s): BNP, PROBNP in the last 168 hours.   DDimer No results for input(s): DDIMER in the last 168 hours.   Radiology    No results found.  Cardiac Studies   Conclusion     Prox RCA to Mid RCA lesion, 100 %stenosed.  There is mild left ventricular systolic dysfunction.  The left ventricular ejection fraction is 45-50%  by visual estimate.  LV end diastolic pressure is moderately elevated.   Lance Santiago is a 56 y.o. male    [id number] LOCATION:  FACILITY: [facility]  PHYSICIAN: Quay Burow, M.D. [birth year]   DATE OF PROCEDURE:  04/25/2017  DATE OF DISCHARGE:     CARDIAC CATHETERIZATION / RCA-DES/ TTVPM     History obtained from chart review. Lance Santiago is a 56 year old thin appearing single Caucasian male without prior cardiac history. He does have a history of hypertension and tobacco abuse. He developed sudden onset of chest pain approximately one hour prior to presentation. EMS was called. His EKG showed inferior ST segment elevation with extension to lateral leads and severe bradycardia/heart block. Upon presentation his heart rate was in the mid 30s and his blood pressure was 70. He was brought emergently to the Cath Lab for stabilization, angiography and intervention.   PROCEDURE DESCRIPTION:   The patient was brought to the second floor [facility] cath lab in the postabsorptive state. He was not premedicated . His right groin was prepped and shaved in usual sterile fashion. Xylocaine 1% was used for local anesthesia. A 6 French sheath was inserted into  the right common femoral arteryAnd a 6 French sheath was inserted into the right common femoral vein using standard Seldinger technique. A balloon tipped temporary transverse pacemaker was placed in the RV apex with thresholds measured down to less than 5 mA. This was placed at 60 bpm. He was placed on Levothroid for hypotension. Once his heart rate improved to 60's blood pressure did as well. 5 French left Judkins diagnostic catheters along with a 5 French pigtail catheter were used for selective coronary angiography and left ventriculography respectively. Isovue dye was used for the entirety of the case. Retrograde aortic, left ventricular and pullback pressures were recorded.  The patient received Angiomax bolus followed by infusion with a therapeutic ACT demonstrated. He received Proventil 180 mg by mouth load. Isovue was used for the entirety of the intervention. Retrograde pressure was monitored in the case. I fairly easily crossed the lesion with an 014 guidewire and predilated with a 2 mm x 12 mm balloon restoring antegrade flow with a door to balloon time of 20 minutes. There was a large thrombus burden and therefore used a Pronto aspiration thrombectomy catheter removed a copious amount of white thrombus. This markedly improved antegrade flow. In addition, I gave a bolus of Aggrastat. I then stented the lesion with a 3 mm x 20 mm long synergy drug-eluting stent delivered at 18 atm and postdilated with 3.5 x 15 mm long noncompliant balloon at 18 atm (3.4 mm) resulting in reduction of the total occlusion to 0% residual with TIMI-3 flow. There was no visible thrombus. The patient tolerated the procedure well. He was somewhat tachycardic during this time and hypertensive and therefore his Levophed was discontinued. The guidewire and catheter were removed. The sheaths including the temporary transvenous pacemaker was secured in place.   IMPRESSION: Successful RCA PCI and drug-eluting stenting in the  setting of a large inferior wall myocardial infarction. By complete heart block, bradycardia and cardiogenic shock. He was treated with aspiration thrombectomy followed by drug eluding stenting using a synergy millimeter by 20 mm long drug-eluting stent postdilated to 3.4 mm. The door to  to balloon time was 20 minutes. Once antegrade flow was restored blood pressure improved as did his heart rate. Aggrastat was discontinued. Angiomax will continue for 4 hours at full dose. The femoral arterial sheath will  be removed. I will maintain the femoral venous sheath and pacemaker until the morning in case he becomes bradycardic overnight. His EF was in the 45-50% range. He can be fast tracked the most likely discharged on Tuesday or Wednesday. He will be treated with appropriate/usual post-MI pharmacology including low-dose beta blocker, antiplatelet therapy which he will continue for a minimum of 12 months and high-dose statin therapy. He left the lab in stable condition.  Quay Burow. MD, [facility] 04/25/2017 6:53 PM      Indications   Acute ST elevation myocardial infarction (STEMI) involving other coronary artery of inferior wall (HCC) [I21.19 (ICD-10-CM)]  Third degree heart block ([place]) [I44.2 (ICD-10-CM)]  Cardiogenic shock ([place]) [R57.0 (ICD-10-CM)]  Procedural Details/Technique   Technical Details Estimated blood loss <50 mL.  During this procedure no sedation was administered.    Coronary Findings   Dominance: Right  Right Coronary Artery  Prox RCA to Mid RCA lesion, 100% stenosed.  Right Posterior Descending Artery  RPDA filled by collaterals from Mid LAD.  Wall Motion              Left Heart   Left Ventricle The left ventricular size is normal. There is mild left ventricular systolic dysfunction. LV end diastolic pressure is moderately elevated. The left ventricular ejection fraction is 45-50% by visual estimate. There are LV function abnormalities. Mild inferior hypokinesia     Coronary Diagrams   Diagnostic Diagram         Echo: Study Conclusions  - Left ventricle: The cavity size was normal. Systolic function was   mildly reduced. The estimated ejection fraction was in the range   of 45% to 50%. Mild hypokinesis of the inferior and inferoseptal   myocardium. Doppler parameters are consistent with abnormal left   ventricular relaxation (grade 1 diastolic dysfunction). - Aortic valve: Transvalvular velocity was within the normal range.   There was no stenosis. There was no regurgitation. - Mitral valve: Transvalvular velocity was within the normal range.   There was no evidence for stenosis. There was trivial   regurgitation. - Right ventricle: The cavity size was normal. Wall thickness was   normal. - Atrial septum: No defect or patent foramen ovale was identified. - Tricuspid valve: There was trivial regurgitation.  Patient Profile     56 y.o. male with history of tobacco abuse and family history of CAD presents with acute inferior STEMI complicated by cardiogenic shock and CHB.  Assessment & Plan    1. Acute inferior STEMI complicated by cardiogenic shock and CHB. S/p emergent stenting of the RCA with good result. No significant disease in LCA. Troponin peak >65. Ecg improved. Asymptomatic. CHB resolved. BP now elevated.  EF 45-50%.  On ASA, plavix, and statin. Hold beta blocker due to CHB. Will add  ACEi for BP and LV dysfunction. Case management to assist with medications.  2. Tobacco abuse - patient plans to quit.  3. Family history of CAD 4. Hyperlipidemia. On statin  Will transfer to telemetry today. Plan DC in am.  For questions or updates, please contact [place] Please consult [email or web address] for contact info under Cardiology/STEMI.      Signed, Viva Gallaher Martin[place], MD  04/27/2017, 1:51 PM

## 2017-04-27 NOTE — Progress Notes (Signed)
CARDIAC REHAB PHASE I   PRE:  Rate/Rhythm: 106 ST  BP:  Sitting: 142/95        SaO2: 98 RA  MODE:  Ambulation: 740 ft   POST:  Rate/Rhythm: 108 ST  BP:  Sitting: 146/111, 137/97 recheck         SaO2: 98 RA  Pt ambulated 740 ft on RA, independent, steady gait, tolerated well with no complaints. Completed MI/stent education. Reviewed yesterday's education, reviewed exercise guidelines, heart healthy diet. Pt verbalized understanding. Phase 2 referral sent to [facility]. Case manager following regarding medication needs. Pt to recliner after walk, call bell within reach.  [phone] Joylene Grapes, RN, BSN 04/27/2017 2:28 PM

## 2017-04-27 NOTE — Care Management Note (Signed)
Case Management Note Donn Pierini RN, BSN Unit 4E-Case Manager-- 2H coverage [phone]  Patient Details  Name: Lance Santiago MRN: [id number] Date of Birth: 1961-04-26  Subjective/Objective:    Pt admitted with STEMI s/p DES- started on Plavix/asa                Action/Plan: PTA pt lived at home with girlfriend- independent- anticipate return home- referral for medication needs- spoke with pt at bedside- per pt he is unemployed and in process of applying for disability- does not have any income- states he can not afford his medications- does not have PCP- has tried to go to [facility] in past but they wanted $80 to be seen and he could not pay this- agreeable to f/u with [facility] and to apply for their sliding scale program vs [facility] HD. Will provide pt with info on each. Call made to [facility] clinic- spoke with Judeth Cornfield- who informed this CM that pt would first need to come in and apply for their Star program before they could make a f/u appointment - (pt was in their system from 3 yrs ago)- application for star program provided to pt. - also discussed pt's medications- looks like he will have 3-4 scripts- ($20-25 which pt reports he does not have money for) is eligible for MATCH- which would be $3 copay per script $12 total for 4- pt states he believes he can borrow that for d/c meds- understands it is a one time use for discharge. MATCH letter provided to pt along with list of pharmacies to use.   Expected Discharge Date:      04/28/17            Expected Discharge Plan:  Home/Self Care  In-House Referral:     Discharge planning Services  CM Consult, Medication Assistance, MATCH Program, Indigent Health Clinic  Post Acute Care Choice:  NA Choice offered to:  NA  DME Arranged:    DME Agency:     HH Arranged:    HH Agency:     Status of Service:  Completed, signed off  If discussed at [employer] of Stay Meetings, dates discussed:    Discharge Disposition: home/self  care   Additional Comments:  Darrold Span, RN 04/27/2017, 4:10 PM

## 2017-04-28 ENCOUNTER — Encounter (HOSPITAL_COMMUNITY): Payer: Self-pay | Admitting: Cardiology

## 2017-04-28 DIAGNOSIS — E7800 Pure hypercholesterolemia, unspecified: Secondary | ICD-10-CM

## 2017-04-28 DIAGNOSIS — E785 Hyperlipidemia, unspecified: Secondary | ICD-10-CM

## 2017-04-28 DIAGNOSIS — I10 Essential (primary) hypertension: Secondary | ICD-10-CM

## 2017-04-28 DIAGNOSIS — I5021 Acute systolic (congestive) heart failure: Secondary | ICD-10-CM

## 2017-04-28 HISTORY — DX: Hyperlipidemia, unspecified: E78.5

## 2017-04-28 MED ORDER — LISINOPRIL 10 MG PO TABS: 10.0000 mg | ORAL_TABLET | Freq: Every day | ORAL | 0 refills | Status: DC

## 2017-04-28 MED ORDER — CLOPIDOGREL BISULFATE 75 MG PO TABS: 75.0000 mg | ORAL_TABLET | Freq: Every day | ORAL | 0 refills | Status: DC

## 2017-04-28 MED ORDER — ATORVASTATIN CALCIUM 80 MG PO TABS: 80.0000 mg | ORAL_TABLET | Freq: Every day | ORAL | 0 refills | Status: DC

## 2017-04-28 MED ORDER — ASPIRIN 81 MG PO CHEW: 81.0000 mg | CHEWABLE_TABLET | Freq: Every day | ORAL | Status: DC

## 2017-04-28 NOTE — Progress Notes (Signed)
Pt discharging to home with "girlfriend" discharge instructions given and verbalizes understanding of information from CM regarding obtaining medications

## 2017-04-28 NOTE — Discharge Summary (Signed)
Discharge Summary    Patient ID: TRAIVON MORRICAL,  MRN: [id number], DOB/AGE: 12-Oct-1960 56 y.o.  Admit date: 04/25/2017 Discharge date: 04/28/2017  Primary Care Provider: No primary care provider on file. Primary Cardiologist: Allyson Sabal   Discharge Diagnoses    Principal Problem:   Acute ST elevation myocardial infarction (STEMI) of inferior wall (HCC) Active Problems:   Acute ST elevation myocardial infarction (STEMI) involving right coronary artery (HCC)   Third degree AV block (HCC)   Tobacco abuse   Cardiogenic shock (HCC)   Hypertension   Hyperlipidemia   Acute systolic heart failure (HCC)   Allergies Allergies  Allergen Reactions  . Penicillins Other (See Comments)    Causes sore throat Has patient had a PCN reaction causing immediate rash, facial/tongue/throat swelling, SOB or lightheadedness with hypotension: No Has patient had a PCN reaction causing severe rash involving mucus membranes or skin necrosis: No Has patient had a PCN reaction that required hospitalization: No Has patient had a PCN reaction occurring within the last 10 years: No If all of the above answers are "NO", then may proceed with Cephalosporin use.    Diagnostic Studies/Procedures    Cath: 04/25/17  Conclusion     Prox RCA to Mid RCA lesion, 100 %stenosed.  There is mild left ventricular systolic dysfunction.  The left ventricular ejection fraction is 45-50% by visual estimate.  LV end diastolic pressure is moderately elevated.   TTE: 04/26/17  Study Conclusions  - Left ventricle: The cavity size was normal. Systolic function was   mildly reduced. The estimated ejection fraction was in the range   of 45% to 50%. Mild hypokinesis of the inferior and inferoseptal   myocardium. Doppler parameters are consistent with abnormal left   ventricular relaxation (grade 1 diastolic dysfunction). - Aortic valve: Transvalvular velocity was within the normal range.   There was no stenosis.  There was no regurgitation. - Mitral valve: Transvalvular velocity was within the normal range.   There was no evidence for stenosis. There was trivial   regurgitation. - Right ventricle: The cavity size was normal. Wall thickness was   normal. - Atrial septum: No defect or patent foramen ovale was identified. - Tricuspid valve: There was trivial regurgitation. _____________   History of Present Illness     56 year old male with hx of HTN who presented to the ED with substernal chest discomfort approximately an hour prior to admission and was transported to [facility].  An EKG in route showed an acute inferolateral myocardial infarction with ST depression in the high lateral and V2 leads.  He was having ongoing chest pain at the time of admission.  He did not have pain previous to this. His history was remarkable for family history hypertension as well as smoking. Noted to have a low HR in the ED and was given atropine in the emergency room without effect on his heart rate. Code STEMI was activated and he was transported to the cath lab for emergent cardiac cath.   Hospital Course     Underwent cardiac cath with Dr. Allyson Sabal noted above with successful PCI/DES to RCA with temp wire placed. Troponin peaked at >65. CHB resolved post cath, and temp wire was removed. He was placed on DAPT with ASA/Brilinta post cath but expressed concern regarding cost and was switched to plavix. Unable to add BB this admission given concerns with CHB. Restarted on home dose of lisinopril at  daily with blood pressures tolerating. Follow up echo showed  EF of 45-50% with mild hypokinesis of the inferior and inferoseptal myocardium. He worked well with cardiac rehab without any further chest pain. Smoking cessation advised this admission, and he plans to quit. LDL 120, started on high dose statin, Hgb A1c 5.7. Case management assisted with medications, plan to use MATCH program. Consider adding BB at follow up  appt.   General: Well developed, well nourished, male appearing in no acute distress. Head: Normocephalic, atraumatic.  Neck: Supple without bruits, JVD. Lungs:  Resp regular and unlabored, CTA. Heart: RRR, S1, S2, no S3, S4, or murmur; no rub. Abdomen: Soft, non-tender, non-distended with normoactive bowel sounds. No hepatomegaly. No rebound/guarding. No obvious abdominal masses. Extremities: No clubbing, cyanosis, edema. Distal pedal pulses are 2+ bilaterally.  Neuro: Alert and oriented X 3. Moves all extremities spontaneously. Psych: Normal affect.  [name] was seen by Dr. [place] and determined stable for discharge home. Follow up in the office has been arranged. Medications are listed below.   _____________  Discharge Vitals Blood pressure 121/80, pulse 83, temperature 97.9 F (36.6 C), temperature source Oral, resp. rate 16, height  (1.702 m), weight 138 lb 3.2 oz (62.7 kg), SpO2 99 %.  Filed Weights   04/25/17 1739 04/26/17 0500 04/27/17 1551  Weight: 138 lb (62.6 kg) 134 lb 12.8 oz (61.1 kg) 138 lb 3.2 oz (62.7 kg)    Labs & Radiologic Studies    CBC  Recent Labs  04/25/17 1752 04/26/17 0656  WBC  --  14.7*  HGB 17.7* 13.8  HCT 52.0 40.7  MCV  --  92.1  PLT  --  169   Basic Metabolic Panel  Recent Labs  04/26/17 0656 04/27/17 0243  NA 136 136  K 3.9 4.0  CL 108 108  CO2 23 23  GLUCOSE 93 92  BUN 13 15  CREATININE 1.11 1.02  CALCIUM 8.4* 8.8*   Liver Function Tests No results for input(s): AST, ALT, ALKPHOS, BILITOT, PROT, ALBUMIN in the last 72 hours. No results for input(s): LIPASE, AMYLASE in the last 72 hours. Cardiac Enzymes  Recent Labs  04/25/17 1950 04/26/17 0050 04/26/17 0656  TROPONINI 20.80* >65.00* >65.00*   BNP Invalid input(s): POCBNP D-Dimer No results for input(s): DDIMER in the last 72 hours. Hemoglobin A1C  Recent Labs  04/26/17 1049  HGBA1C 5.7*   Fasting Lipid Panel  Recent Labs  04/26/17 1019    CHOL 199  HDL 29*  LDLCALC 120*  TRIG 250*  CHOLHDL 6.9   Thyroid Function Tests No results for input(s): TSH, T4TOTAL, T3FREE, THYROIDAB in the last 72 hours.  Invalid input(s): FREET3 _____________  No results found. Disposition   Pt is being discharged home today in good condition.  Follow-up Plans & Appointments    Follow-up Information    [facility]. Go to.   Why:  will need to go to clinic to apply for Star sliding fee discount program before they will make apointment- can walk in m-thur anytime before 4:00 and on Fridays before 1100- have provided application for program-  Contact information: [address] [place], [place] phone: [phone]       [facility] Department Follow up.   Why:  if unable to get appointment with [facility]- call here for appointment and to see about medication assistance programs- (MAP) Contact information: [address], [place] [address]  Phone: [phone]       Abelino Derrick, PA-C Follow up on 05/10/2017.  Specialties:  Cardiology, Radiology Why:  at 8:30am for your follow up appt.  Contact information: [address] [place] [address] [phone]          Discharge Instructions    AMB Referral to Cardiac Rehabilitation - Phase II    Complete by:  As directed    Diagnosis:  STEMI   Amb Referral to Cardiac Rehabilitation    Complete by:  As directed    Diagnosis:   Coronary Stents Comment - to [place] STEMI     Call MD for:  redness, tenderness, or signs of infection (pain, swelling, redness, odor or green/yellow discharge around incision site)    Complete by:  As directed    Diet - low sodium heart healthy    Complete by:  As directed    Discharge instructions    Complete by:  As directed    Groin Site Care Refer to this sheet in the next few weeks. These instructions provide you with information on caring for yourself after your procedure. Your caregiver may  also give you more specific instructions. Your treatment has been planned according to current medical practices, but problems sometimes occur. Call your caregiver if you have any problems or questions after your procedure. HOME CARE INSTRUCTIONS You may shower 24 hours after the procedure. Remove the bandage (dressing) and gently wash the site with plain soap and water. Gently pat the site dry.  Do not apply powder or lotion to the site.  Do not sit in a bathtub, swimming pool, or whirlpool for 5 to 7 days.  No bending, squatting, or lifting anything over 10 pounds (4.5 kg) as directed by your caregiver.  Inspect the site at least twice daily.  Do not drive home if you are discharged the same day of the procedure. Have someone else drive you.  You may drive 24 hours after the procedure unless otherwise instructed by your caregiver.  What to expect: Any bruising will usually fade within 1 to 2 weeks.  Blood that collects in the tissue (hematoma) may be painful to the touch. It should usually decrease in size and tenderness within 1 to 2 weeks.  SEEK IMMEDIATE MEDICAL CARE IF: You have unusual pain at the groin site or down the affected leg.  You have redness, warmth, swelling, or pain at the groin site.  You have drainage (other than a small amount of blood on the dressing).  You have chills.  You have a fever or persistent symptoms for more than 72 hours.  You have a fever and your symptoms suddenly get worse.  Your leg becomes pale, cool, tingly, or numb.  You have heavy bleeding from the site. Hold pressure on the site. Marland Kitchen  PLEASE DO NOT MISS ANY DOSES OF YOUR PLAVIX!!!!! Also keep a log of you blood pressures and bring back to your follow up appt. Please call the office with any questions.   Patients taking blood thinners should generally stay away from medicines like ibuprofen, Advil, Motrin, naproxen, and Aleve due to risk of stomach bleeding. You may take Tylenol as directed or talk to  your primary doctor about alternatives.   Increase activity slowly    Complete by:  As directed       Discharge Medications     Medication List    TAKE these medications   aspirin 81 MG chewable tablet Chew 1 tablet (81 mg total) by mouth daily.   atorvastatin 80 MG tablet Commonly known as:  LIPITOR Take 1 tablet (80 mg total) by mouth daily at 6 PM.   clopidogrel 75 MG tablet Commonly known as:  PLAVIX Take 1 tablet (75 mg total) by mouth daily.   lisinopril 10 MG tablet Commonly known as:  PRINIVIL,ZESTRIL Take 1 tablet (10 mg total) by mouth daily.        Aspirin prescribed at discharge?  Yes High Intensity Statin Prescribed? (Lipitor 40-80mg  or Crestor 20-40mg ): Yes Beta Blocker Prescribed? No: Soft BP, and episode of CHB during admission (consider adding at follow up appt)  For EF <40%, was ACEI/ARB Prescribed? Yes ADP Receptor Inhibitor Prescribed? (i.e. Plavix etc.-Includes Medically Managed Patients): Yes For EF <40%, Aldosterone Inhibitor Prescribed? No: EF ok Was EF assessed during THIS hospitalization? Yes Was Cardiac Rehab II ordered? (Included Medically managed Patients): Yes   Outstanding Labs/Studies   FLP/LFTs in 6 weeks.   Duration of Discharge Encounter   Greater than 30 minutes including physician time.  Signed, Laverda Page NP-C 04/28/2017, 11:57 AM\

## 2017-05-10 ENCOUNTER — Encounter: Payer: Self-pay | Admitting: Cardiology

## 2017-05-10 ENCOUNTER — Ambulatory Visit (INDEPENDENT_AMBULATORY_CARE_PROVIDER_SITE_OTHER): Payer: Self-pay | Admitting: Cardiology

## 2017-05-10 VITALS — BP 128/78 | HR 79 | Ht 67.0 in | Wt 134.8 lb

## 2017-05-10 DIAGNOSIS — I442 Atrioventricular block, complete: Secondary | ICD-10-CM

## 2017-05-10 DIAGNOSIS — I739 Peripheral vascular disease, unspecified: Secondary | ICD-10-CM

## 2017-05-10 DIAGNOSIS — I2510 Atherosclerotic heart disease of native coronary artery without angina pectoris: Secondary | ICD-10-CM

## 2017-05-10 DIAGNOSIS — I10 Essential (primary) hypertension: Secondary | ICD-10-CM

## 2017-05-10 DIAGNOSIS — J449 Chronic obstructive pulmonary disease, unspecified: Secondary | ICD-10-CM

## 2017-05-10 DIAGNOSIS — I2111 ST elevation (STEMI) myocardial infarction involving right coronary artery: Secondary | ICD-10-CM

## 2017-05-10 DIAGNOSIS — Z9861 Coronary angioplasty status: Secondary | ICD-10-CM

## 2017-05-10 DIAGNOSIS — Z720 Tobacco use: Secondary | ICD-10-CM

## 2017-05-10 HISTORY — DX: Atherosclerotic heart disease of native coronary artery without angina pectoris: I25.10

## 2017-05-10 HISTORY — DX: Peripheral vascular disease, unspecified: I73.9

## 2017-05-10 MED ORDER — ATORVASTATIN CALCIUM 80 MG PO TABS: 80.0000 mg | ORAL_TABLET | Freq: Every day | ORAL | 3 refills | Status: DC

## 2017-05-10 MED ORDER — LISINOPRIL 10 MG PO TABS: 10.0000 mg | ORAL_TABLET | Freq: Every day | ORAL | 3 refills | Status: DC

## 2017-05-10 MED ORDER — CLOPIDOGREL BISULFATE 75 MG PO TABS: 75.0000 mg | ORAL_TABLET | Freq: Every day | ORAL | 3 refills | Status: DC

## 2017-05-10 NOTE — Assessment & Plan Note (Signed)
S/P inferior MI -RCA PCI with DES 04/25/17

## 2017-05-10 NOTE — Assessment & Plan Note (Signed)
On high dose statin Rx 

## 2017-05-10 NOTE — Assessment & Plan Note (Signed)
Controlled.  

## 2017-05-10 NOTE — Patient Instructions (Signed)
Medication Instructions: Corine Shelter, PA-C, recommends that you continue on your current medications as directed. Please refer to the Current Medication list given to you today.  Labwork: NONE ORDERED  Testing/Procedures: 1. Chest X-Ray - A chest x-ray takes a picture of the organs and structures inside the chest, including the heart, lungs, and blood vessels. This test can show several things, including, whether the heart is enlarges; whether fluid is building up in the lungs; and whether pacemaker / defibrillator leads are still in place.  Follow-up: Franky Macho recommends that you schedule a follow-up appointment in 3 months with Dr Allyson Sabal.  If you need a refill on your cardiac medications before your next appointment, please call your pharmacy.

## 2017-05-10 NOTE — Assessment & Plan Note (Signed)
Pt had CHB and shock with his MI. He gives a history of previous beta blocker intolerance when hospitalized in [place] some years ago. He is currently in NSR, not on beta blocker

## 2017-05-10 NOTE — Assessment & Plan Note (Addendum)
Pt has LLE PVD and claudication. Previous eval in [place] 3 years ago with doppler.  Currently his symptoms are not lifestyle limiting
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
## 2017-05-10 NOTE — Assessment & Plan Note (Signed)
Long time smoker, > 1ppd. Still smoking. COPD on exam

## 2017-05-10 NOTE — Assessment & Plan Note (Signed)
Total pRCA with L-R collaterals, treated with PCI and DES, no other significant CAD

## 2017-08-13 ENCOUNTER — Encounter: Payer: Self-pay | Admitting: Cardiovascular Disease

## 2017-08-13 ENCOUNTER — Ambulatory Visit (INDEPENDENT_AMBULATORY_CARE_PROVIDER_SITE_OTHER): Payer: Self-pay | Admitting: Cardiovascular Disease

## 2017-08-13 DIAGNOSIS — E785 Hyperlipidemia, unspecified: Secondary | ICD-10-CM

## 2017-08-13 DIAGNOSIS — I739 Peripheral vascular disease, unspecified: Secondary | ICD-10-CM

## 2017-08-13 DIAGNOSIS — I2119 ST elevation (STEMI) myocardial infarction involving other coronary artery of inferior wall: Secondary | ICD-10-CM

## 2017-08-13 DIAGNOSIS — Z720 Tobacco use: Secondary | ICD-10-CM

## 2017-08-13 DIAGNOSIS — I10 Essential (primary) hypertension: Secondary | ICD-10-CM

## 2017-08-13 MED ORDER — LISINOPRIL 10 MG PO TABS: 10.0000 mg | ORAL_TABLET | Freq: Every day | ORAL | 3 refills | Status: DC

## 2017-08-13 MED ORDER — CLOPIDOGREL BISULFATE 75 MG PO TABS: 75.0000 mg | ORAL_TABLET | Freq: Every day | ORAL | 3 refills | Status: DC

## 2017-08-13 MED ORDER — ATORVASTATIN CALCIUM 80 MG PO TABS: 80.0000 mg | ORAL_TABLET | Freq: Every day | ORAL | 3 refills | Status: DC

## 2017-08-13 NOTE — Assessment & Plan Note (Signed)
History of dyslipidemia originally placed on statin therapy with cholesterol drawn at the time of his MI 04/26/17 revealed total cholesterol 199, LDL 120 and HDL of 29. I reinforced the importance of medication compliance and dietary modification.

## 2017-08-13 NOTE — Progress Notes (Signed)
08/13/2017 [name]   May 28, 1961  [id number]  Primary Physician Patient, No Pcp Per Primary Cardiologist: Runell Gess MD Milagros Loll, [place], [place]  HPI:  Lance Santiago is a 57 y.o.  thin appearing single Caucasian male father of 2, grandfather and 2 grandchildren this accompanied by his girlfriend today. He had no prior cardiac history. He does have a history of hypertension, hyperlipidemia and continued tobacco abuse recalcitrant risk factor modification.Marland Kitchen He developed sudden onset of chest pain approximately one hour prior to presentation. EMS was called. His EKG showed inferior ST segment elevation with extension to lateral leads and severe bradycardia/heart block. Upon presentation his heart rate was in the mid 30s and his blood pressure was 70. He was brought emergently to the Cath Lab for stabilization, angiography and intervention. At the time of heart cath 04/25/17 he was in cardiogenic shock with complete heart block. I catheterized him generally. I placed a temporary transvenous pacemaker and performed aspiration thrombectomy, PCI and drug-eluting stenting using a 3 mm x 20 mm long synergy drug-eluting stent. His EF was 45-50% with inferior hypokinesia. He has had no subsequent chest pain. He did say that he stopped taking all his medications a month after discharge because of financial constraints although he does continue to smoke.   Current Meds  Medication Sig  . aspirin 81 MG chewable tablet Chew 1 tablet (81 mg total) by mouth daily.  Marland Kitchen atorvastatin (LIPITOR) 80 MG tablet Take 1 tablet (80 mg total) by mouth daily at 6 PM.  . clopidogrel (PLAVIX) 75 MG tablet Take 1 tablet (75 mg total) by mouth daily.  Marland Kitchen lisinopril (PRINIVIL,ZESTRIL) 10 MG tablet Take 1 tablet (10 mg total) by mouth daily.     Allergies  Allergen Reactions  . Penicillins Other (See Comments)    Causes sore throat Has patient had a PCN reaction causing immediate rash, facial/tongue/throat  swelling, SOB or lightheadedness with hypotension: No Has patient had a PCN reaction causing severe rash involving mucus membranes or skin necrosis: No Has patient had a PCN reaction that required hospitalization: No Has patient had a PCN reaction occurring within the last 10 years: No If all of the above answers are "NO", then may proceed with Cephalosporin use.    Social History   Socioeconomic History  . Marital status: Divorced    Spouse name: Not on file  . Number of children: Not on file  . Years of education: Not on file  . Highest education level: Not on file  Social Needs  . Financial resource strain: Not on file  . Food insecurity - worry: Not on file  . Food insecurity - inability: Not on file  . Transportation needs - medical: Not on file  . Transportation needs - non-medical: Not on file  Occupational History  . Not on file  Tobacco Use  . Smoking status: Current Every Day Smoker    Packs/day: 1.00    Years: 40.00    Pack years: 40.00    Types: Cigarettes  . Smokeless tobacco: Never Used  Substance and Sexual Activity  . Alcohol use: Not on file  . Drug use: Not on file  . Sexual activity: Not on file  Other Topics Concern  . Not on file  Social History Narrative  . Not on file     Review of Systems: General: negative for chills, fever, night sweats or weight changes.  Cardiovascular: negative for chest pain, dyspnea on exertion, edema, orthopnea,  palpitations, paroxysmal nocturnal dyspnea or shortness of breath Dermatological: negative for rash Respiratory: negative for cough or wheezing Urologic: negative for hematuria Abdominal: negative for nausea, vomiting, diarrhea, bright red blood per rectum, melena, or hematemesis Neurologic: negative for visual changes, syncope, or dizziness All other systems reviewed and are otherwise negative except as noted above.    Blood pressure 140/87, pulse 80, height 5\' 7"  (1.702 m), weight 134 lb (60.8 kg), SpO2 94  %.  General appearance: alert and no distress Neck: no adenopathy, no carotid bruit, no JVD, supple, symmetrical, trachea midline and thyroid not enlarged, symmetric, no tenderness/mass/nodules Lungs: clear to auscultation bilaterally Heart: regular rate and rhythm, S1, S2 normal, no murmur, click, rub or gallop Extremities: extremities normal, atraumatic, no cyanosis or edema Pulses: 2+ and symmetric Skin: Skin color, texture, turgor normal. No rashes or lesions Neurologic: Alert and oriented X 3, normal strength and tone. Normal symmetric reflexes. Normal coordination and gait  EKG not performed today  ASSESSMENT AND PLAN:   Acute ST elevation myocardial infarction (STEMI) of inferior wall (HCC) History of CAD status post large inferior STEMI 04/25/17 with aspiration thrombectomy, PCI and drug-eluting stenting of a dominant RCA. He also had complete heart block. He was discharged home on aspirin, clopidogrel, statin therapy and beta blocker. He took his meds for possibly one month and has stopped all his medications because of financial constraints. He does continue to smoke one pack per day. He denies chest pain or shortness of breath.  Tobacco abuse Continued tobacco abuse recalcitrant risk factor modification (one pack per day).  Hypertension History of essential hypertension blood pressure measured 140/87. He is currently on no medications although he was prescribed lisinopril  Dyslipidemia History of dyslipidemia originally placed on statin therapy with cholesterol drawn at the time of his MI 04/26/17 revealed total cholesterol 199, LDL 120 and HDL of 29. I reinforced the importance of medication compliance and dietary modification.  PVD (peripheral vascular disease) with claudication (HCC) History of peripheral arterial disease evaluated at [facility] 3 years ago. He does complain of lifestyle limiting claudication at this time      Runell Gess MD  [facility], [facility] 08/13/2017 8:42 AM

## 2017-08-13 NOTE — Assessment & Plan Note (Signed)
History of essential hypertension blood pressure measured 140/87. He is currently on no medications although he was prescribed lisinopril

## 2017-08-13 NOTE — Assessment & Plan Note (Signed)
History of CAD status post large inferior STEMI 04/25/17 with aspiration thrombectomy, PCI and drug-eluting stenting of a dominant RCA. He also had complete heart block. He was discharged home on aspirin, clopidogrel, statin therapy and beta blocker. He took his meds for possibly one month and has stopped all his medications because of financial constraints. He does continue to smoke one pack per day. He denies chest pain or shortness of breath.

## 2017-08-13 NOTE — Assessment & Plan Note (Signed)
Continued tobacco abuse recalcitrant risk factor modification (one pack per day).

## 2017-08-13 NOTE — Assessment & Plan Note (Signed)
History of peripheral arterial disease evaluated at [facility] 3 years ago. He does complain of lifestyle limiting claudication at this time

## 2017-08-13 NOTE — Patient Instructions (Addendum)
Medication Instructions: Your physician recommends that you continue on your current medications as directed. Please refer to the Current Medication list given to you today.  Labwork: Your physician recommends that you return for a FASTING lipid profile and hepatic function panel in 2 months if taking Atorvastatin.   Follow-Up: Your physician wants you to follow-up in: 1 year with Dr. Allyson Sabal. You will receive a reminder letter in the mail two months in advance. If you don't receive a letter, please call our office to schedule the follow-up appointment.  If you need a refill on your cardiac medications before your next appointment, please call your pharmacy.

## 2017-08-16 MED FILL — LISINOPRIL 10 MG TABS: 10 | 90 days supply | Qty: 90 | Fill #0

## 2017-08-16 MED FILL — CLOPIDOGREL 75 MG TABLET: 75 | 90 days supply | Qty: 90 | Fill #0

## 2017-08-16 MED FILL — ATORVASTATIN 80 MG TABLET: 80 | 90 days supply | Qty: 90 | Fill #0

## 2018-04-03 ENCOUNTER — Emergency Department (HOSPITAL_COMMUNITY)
Admission: EM | Admit: 2018-04-03 | Discharge: 2018-04-03 | Disposition: A | Discharge status: Home / Self Care | Payer: Self-pay | Attending: Emergency Medicine | Admitting: Emergency Medicine

## 2018-04-03 ENCOUNTER — Ambulatory Visit (HOSPITAL_COMMUNITY): Admission: RE | Admit: 2018-04-03 | Payer: Self-pay | Source: Ambulatory Visit

## 2018-04-03 ENCOUNTER — Other Ambulatory Visit: Payer: Self-pay

## 2018-04-03 DIAGNOSIS — I5020 Unspecified systolic (congestive) heart failure: Secondary | ICD-10-CM | POA: Insufficient documentation

## 2018-04-03 DIAGNOSIS — F17210 Nicotine dependence, cigarettes, uncomplicated: Secondary | ICD-10-CM | POA: Insufficient documentation

## 2018-04-03 DIAGNOSIS — I110 Hypertensive heart disease with heart failure: Secondary | ICD-10-CM | POA: Insufficient documentation

## 2018-04-03 DIAGNOSIS — M79604 Pain in right leg: Secondary | ICD-10-CM

## 2018-04-03 DIAGNOSIS — I252 Old myocardial infarction: Secondary | ICD-10-CM | POA: Insufficient documentation

## 2018-04-03 DIAGNOSIS — I2510 Atherosclerotic heart disease of native coronary artery without angina pectoris: Secondary | ICD-10-CM | POA: Insufficient documentation

## 2018-04-03 DIAGNOSIS — M79651 Pain in right thigh: Secondary | ICD-10-CM | POA: Insufficient documentation

## 2018-04-03 LAB — I-STAT CHEM 8, ED
BUN: 23 mg/dL — AB (ref 6–20)
CALCIUM ION: 1.22 mmol/L (ref 1.15–1.40)
CHLORIDE: 104 mmol/L (ref 98–111)
Creatinine, Ser: 0.9 mg/dL (ref 0.61–1.24)
GLUCOSE: 104 mg/dL — AB (ref 70–99)
HCT: 47 % (ref 39.0–52.0)
Hemoglobin: 16 g/dL (ref 13.0–17.0)
Potassium: 4.5 mmol/L (ref 3.5–5.1)
SODIUM: 136 mmol/L (ref 135–145)
TCO2: 25 mmol/L (ref 22–32)

## 2018-04-03 MED ORDER — CEPHALEXIN 500 MG PO CAPS: 500.0000 mg | ORAL_CAPSULE | Freq: Four times a day (QID) | ORAL | 0 refills | Status: DC

## 2018-04-03 MED ORDER — SULFAMETHOXAZOLE-TRIMETHOPRIM 800-160 MG PO TABS: 1.0000 | ORAL_TABLET | Freq: Two times a day (BID) | ORAL | 0 refills | Status: AC

## 2018-04-03 NOTE — ED Triage Notes (Signed)
Patient c/o right thigh pain, states "I think I have a blockage, I have had this before". Patient is suppose to be taking Plavix but has been out for over a month.

## 2018-04-03 NOTE — ED Provider Notes (Signed)
[facility] EMERGENCY DEPARTMENT Provider Note   CSN: [id number] Arrival date & time: 04/03/18  0127     History   Chief Complaint Chief Complaint  Patient presents with  . Leg Pain    HPI Lance Santiago is a 57 y.o. male.  HPI 57 year old male with history of CAD, hypertension and hyperlipidemia comes in with chief complaint of leg pain. Patient reports that he started noticing some pain 2 days ago in his right lower extremity.  Yesterday in the evening he started noticing some redness which has since expanded.  Patient denies any associated nausea, vomiting, fevers, chills.  He also denies any new numbness, tingling or weakness.  Patient has history of CAD and smokes 1 pack a day. He denies any history of DVT.  Past Medical History:  Diagnosis Date  . Coronary artery disease   . Hyperlipidemia   . Hypertension   . Lumbar disc disease   . Myocardial infarction (HCC)    9/18 PCI/DESx1 to RCA, EF 45%  . Smoking   . Systolic heart failure [facility])     Patient Active Problem List   Diagnosis Date Noted  . CAD S/P percutaneous coronary angioplasty 05/10/2017  . PVD (peripheral vascular disease) with claudication (HCC) 05/10/2017  . Hypertension 04/28/2017  . Dyslipidemia 04/28/2017  . Acute systolic heart failure (HCC) 04/28/2017  . Tobacco abuse 04/26/2017  . Cardiogenic shock (HCC) 04/26/2017  . Acute ST elevation myocardial infarction (STEMI) of inferior wall (HCC) 04/25/2017  . Acute ST elevation myocardial infarction (STEMI) involving right coronary artery (HCC) 04/25/2017  . Complete heart block [facility])     Past Surgical History:  Procedure Laterality Date  . CARDIAC CATHETERIZATION    . CORONARY/GRAFT ACUTE MI REVASCULARIZATION N/A 04/25/2017   Procedure: Coronary/Graft Acute MI Revascularization;  Surgeon: Runell Gess, MD;  Location: [facility] INVASIVE CV LAB;  Service: Cardiovascular;  Laterality: N/A;  . KNEE SURGERY    . LEFT HEART CATH  AND CORONARY ANGIOGRAPHY N/A 04/25/2017   Procedure: LEFT HEART CATH AND CORONARY ANGIOGRAPHY;  Surgeon: Runell Gess, MD;  Location: [facility] INVASIVE CV LAB;  Service: Cardiovascular;  Laterality: N/A;  . LUMBAR LAMINECTOMY          Home Medications    Prior to Admission medications   Medication Sig Start Date End Date Taking? Authorizing Provider  aspirin 81 MG chewable tablet Chew 1 tablet (81 mg total) by mouth daily. Patient not taking: Reported on 04/03/2018 04/29/17   Laverda Page B, NP  atorvastatin (LIPITOR) 80 MG tablet Take 1 tablet (80 mg total) by mouth daily at 6 PM. Patient not taking: Reported on 04/03/2018 08/13/17   Runell Gess, MD  cephALEXin (KEFLEX) 500 MG capsule Take 1 capsule (500 mg total) by mouth 4 (four) times daily. 04/03/18   Derwood Kaplan, MD  clopidogrel (PLAVIX) 75 MG tablet Take 1 tablet (75 mg total) by mouth daily. Patient not taking: Reported on 04/03/2018 08/13/17   Runell Gess, MD  lisinopril (PRINIVIL,ZESTRIL) 10 MG tablet Take 1 tablet (10 mg total) by mouth daily. Patient not taking: Reported on 04/03/2018 08/13/17   Runell Gess, MD  sulfamethoxazole-trimethoprim (BACTRIM DS,SEPTRA DS) 800-160 MG tablet Take 1 tablet by mouth 2 (two) times daily for 7 days. 04/03/18 04/10/18  Derwood Kaplan, MD    Family History Family History  Problem Relation Age of Onset  . Diabetes Mother   . Coronary artery disease Father     Social History  Social History   Tobacco Use  . Smoking status: Current Every Day Smoker    Packs/day: 1.00    Years: 40.00    Pack years: 40.00    Types: Cigarettes  . Smokeless tobacco: Never Used  Substance Use Topics  . Alcohol use: Not on file  . Drug use: Not on file     Allergies   Penicillins   Review of Systems Review of Systems  Constitutional: Positive for activity change. Negative for fever.  Respiratory: Negative for chest tightness and shortness of breath.   Cardiovascular: Negative for  chest pain.  Gastrointestinal: Negative for nausea and vomiting.  Skin: Positive for rash.  Allergic/Immunologic: Negative for immunocompromised state.  Hematological: Does not bruise/bleed easily.     Physical Exam Updated Vital Signs BP (!) 157/117   Pulse 90   Temp 98.7 F (37.1 C) (Oral)   Resp 18   Ht 5\' 7"  (1.702 m)   Wt 59 kg   SpO2 98%   BMI 20.36 kg/m   Physical Exam  Constitutional: He is oriented to person, place, and time. He appears well-developed.  HENT:  Head: Atraumatic.  Neck: Neck supple.  Cardiovascular: Normal rate.  Pulmonary/Chest: Effort normal.  Neurological: He is alert and oriented to person, place, and time.  Skin: Skin is warm. Rash noted.  Patient has erythematous lesion in his right thigh. There is no fluctuance or crepitus.   Nursing note and vitals reviewed.    ED Treatments / Results  Labs (all labs ordered are listed, but only abnormal results are displayed) Labs Reviewed  I-STAT CHEM 8, ED - Abnormal; Notable for the following components:      Result Value   BUN 23 (*)    Glucose, Bld 104 (*)    All other components within normal limits    EKG None  Radiology No results found.  Procedures Procedures (including critical care time)  Medications Ordered in ED Medications - No data to display   Initial Impression / Assessment and Plan / ED Course  I have reviewed the triage vital signs and the nursing notes.  Pertinent labs & imaging results that were available during my care of the patient were reviewed by me and considered in my medical decision making (see chart for details).     57 year old male comes in with chief complaint of right leg pain. Patient is noted to have erythema over his right thigh medially, which is stretching from the knee going proximally towards the inguinal canal.  Differential diagnosis includes thrombophlebitis, DVT, cellulitis, lymphangitis. Patient has 1+ distal pulse in the popliteal  region and dorsalis pedis, therefore we do not think this is arterial insufficiency at this time.  However, given patient's extensive smoking history if he continues to get worse that should be considered in the differential as well.  Plan is to order ultrasound DVT that can be done this morning. Patient was started taking antibiotics if the ultrasound DVT is negative. He will return to the ER if his symptoms are getting worse despite the antibiotics, or if the DVT studies positive for acute DVT.  Patient understands the plan is comfortable with it.  Final Clinical Impressions(s) / ED Diagnoses   Final diagnoses:  Right leg pain    ED Discharge Orders         Ordered    LE VENOUS     04/03/18 0507    sulfamethoxazole-trimethoprim (BACTRIM DS,SEPTRA DS) 800-160 MG tablet  2 times daily  04/03/18 0509    cephALEXin (KEFLEX) 500 MG capsule  4 times daily     04/03/18 0509           Derwood Kaplan, MD 04/03/18 [phone]

## 2018-04-03 NOTE — Discharge Instructions (Addendum)
Take the antibiotics only if you the ultrasound does not show any blood clots. As discussed, call the number listed on your discharge paperwork to schedule an appointment for your ultrasound to rule out a blood clot in the leg.  Return to the ER if you start having worsening in the swelling, redness or pain in your leg.

## 2018-04-03 NOTE — ED Notes (Signed)
Right upper leg noted to be red and swollen.  Thrill felt on palpation.

## 2018-04-04 ENCOUNTER — Ambulatory Visit (HOSPITAL_COMMUNITY)
Admission: RE | Admit: 2018-04-04 | Discharge: 2018-04-04 | Disposition: A | Discharge status: Home / Self Care | Payer: Self-pay | Source: Ambulatory Visit | Attending: Emergency Medicine | Admitting: Emergency Medicine

## 2018-04-04 DIAGNOSIS — R609 Edema, unspecified: Secondary | ICD-10-CM

## 2018-04-04 DIAGNOSIS — R600 Localized edema: Secondary | ICD-10-CM | POA: Insufficient documentation

## 2018-04-04 NOTE — Progress Notes (Signed)
*  Preliminary Results* Right lower extremity venous duplex completed. Right lower extremity is negative for deep vein thrombosis. There is no evidence of right Baker's cyst.  Incidental finding: there are multiple heterogenous areas in bilateral groins, suggestive of prominent inguinal lymph nodes.  04/04/2018 9:02 AM  Gertie Fey, MHA, RVT, RDCS, RDMS

## 2018-04-19 DIAGNOSIS — B49 Unspecified mycosis: Secondary | ICD-10-CM

## 2018-04-19 HISTORY — DX: Unspecified mycosis: B49

## 2018-05-02 DIAGNOSIS — Z5971 Insufficient health insurance coverage: Secondary | ICD-10-CM

## 2018-05-02 HISTORY — DX: Insufficient health insurance coverage: Z59.71

## 2018-11-28 ENCOUNTER — Telehealth: Payer: Self-pay | Admitting: *Deleted

## 2018-12-02 NOTE — Telephone Encounter (Signed)
Left message for patient to cann and schedule virtual/telephone yearly follow up with Dr. Allyson Sabal

## 2018-12-05 NOTE — Telephone Encounter (Signed)
Left message for patient to call and schedule 12 month follow up visit (telehealth) with Dr. Allyson Sabal

## 2019-01-25 ENCOUNTER — Ambulatory Visit: Payer: Self-pay | Admitting: Cardiovascular Disease

## 2020-04-23 ENCOUNTER — Emergency Department (HOSPITAL_BASED_OUTPATIENT_CLINIC_OR_DEPARTMENT_OTHER)
Admission: EM | Admit: 2020-04-23 | Discharge: 2020-04-23 | Disposition: A | Discharge status: Home / Self Care | Payer: Self-pay | Source: Home / Self Care

## 2020-04-23 ENCOUNTER — Other Ambulatory Visit: Payer: Self-pay

## 2020-04-23 ENCOUNTER — Emergency Department (HOSPITAL_COMMUNITY)
Admission: EM | Admit: 2020-04-23 | Discharge: 2020-04-23 | Disposition: A | Discharge status: Home / Self Care | Payer: Self-pay | Attending: Emergency Medicine | Admitting: Emergency Medicine

## 2020-04-23 ENCOUNTER — Encounter (HOSPITAL_COMMUNITY): Payer: Self-pay | Admitting: Emergency Medicine

## 2020-04-23 DIAGNOSIS — M79661 Pain in right lower leg: Secondary | ICD-10-CM | POA: Insufficient documentation

## 2020-04-23 DIAGNOSIS — M79609 Pain in unspecified limb: Secondary | ICD-10-CM

## 2020-04-23 DIAGNOSIS — Z5321 Procedure and treatment not carried out due to patient leaving prior to being seen by health care provider: Secondary | ICD-10-CM | POA: Insufficient documentation

## 2020-04-23 NOTE — Progress Notes (Signed)
Right lower extremity venous duplex has been completed. Preliminary results can be found in CV Proc through chart review.  Results were given to Dr. Judd Lien.  04/23/20 6:45 PM Olen Cordial RVT

## 2020-04-23 NOTE — ED Triage Notes (Signed)
Pt. Stated, I have something going on with my rt. Leg , at the upper calf and up in my thigh. Its either a blood clot or cellulitis, and its not red so I don't think its cellulitis Unable to visualize in lobby.

## 2023-04-25 DIAGNOSIS — Z8249 Family history of ischemic heart disease and other diseases of the circulatory system: Secondary | ICD-10-CM

## 2023-04-25 DIAGNOSIS — G8191 Hemiplegia, unspecified affecting right dominant side: Secondary | ICD-10-CM | POA: Diagnosis present

## 2023-04-25 DIAGNOSIS — I745 Embolism and thrombosis of iliac artery: Secondary | ICD-10-CM | POA: Diagnosis present

## 2023-04-25 DIAGNOSIS — I2510 Atherosclerotic heart disease of native coronary artery without angina pectoris: Secondary | ICD-10-CM | POA: Diagnosis present

## 2023-04-25 DIAGNOSIS — E781 Pure hyperglyceridemia: Secondary | ICD-10-CM | POA: Diagnosis present

## 2023-04-25 DIAGNOSIS — F1210 Cannabis abuse, uncomplicated: Secondary | ICD-10-CM | POA: Diagnosis present

## 2023-04-25 DIAGNOSIS — Z79899 Other long term (current) drug therapy: Secondary | ICD-10-CM

## 2023-04-25 DIAGNOSIS — G8929 Other chronic pain: Secondary | ICD-10-CM | POA: Diagnosis present

## 2023-04-25 DIAGNOSIS — K760 Fatty (change of) liver, not elsewhere classified: Secondary | ICD-10-CM | POA: Diagnosis present

## 2023-04-25 DIAGNOSIS — I7409 Other arterial embolism and thrombosis of abdominal aorta: Secondary | ICD-10-CM | POA: Diagnosis present

## 2023-04-25 DIAGNOSIS — I63522 Cerebral infarction due to unspecified occlusion or stenosis of left anterior cerebral artery: Principal | ICD-10-CM | POA: Diagnosis present

## 2023-04-25 DIAGNOSIS — E875 Hyperkalemia: Secondary | ICD-10-CM | POA: Diagnosis not present

## 2023-04-25 DIAGNOSIS — I161 Hypertensive emergency: Secondary | ICD-10-CM | POA: Diagnosis present

## 2023-04-25 DIAGNOSIS — M4804 Spinal stenosis, thoracic region: Secondary | ICD-10-CM | POA: Diagnosis present

## 2023-04-25 DIAGNOSIS — Z880 Allergy status to penicillin: Secondary | ICD-10-CM

## 2023-04-25 DIAGNOSIS — I252 Old myocardial infarction: Secondary | ICD-10-CM

## 2023-04-25 DIAGNOSIS — I6523 Occlusion and stenosis of bilateral carotid arteries: Secondary | ICD-10-CM | POA: Diagnosis present

## 2023-04-25 DIAGNOSIS — I724 Aneurysm of artery of lower extremity: Secondary | ICD-10-CM | POA: Diagnosis present

## 2023-04-25 DIAGNOSIS — F17210 Nicotine dependence, cigarettes, uncomplicated: Secondary | ICD-10-CM | POA: Diagnosis present

## 2023-04-25 DIAGNOSIS — I110 Hypertensive heart disease with heart failure: Secondary | ICD-10-CM | POA: Diagnosis present

## 2023-04-25 DIAGNOSIS — I5022 Chronic systolic (congestive) heart failure: Secondary | ICD-10-CM | POA: Diagnosis present

## 2023-04-25 DIAGNOSIS — Z91148 Patient's other noncompliance with medication regimen for other reason: Secondary | ICD-10-CM

## 2023-04-25 DIAGNOSIS — Z833 Family history of diabetes mellitus: Secondary | ICD-10-CM

## 2023-04-25 DIAGNOSIS — Z955 Presence of coronary angioplasty implant and graft: Secondary | ICD-10-CM

## 2023-04-25 DIAGNOSIS — R29706 NIHSS score 6: Secondary | ICD-10-CM | POA: Diagnosis present

## 2023-04-26 ENCOUNTER — Emergency Department (HOSPITAL_COMMUNITY): Payer: Medicaid Other

## 2023-04-26 ENCOUNTER — Inpatient Hospital Stay (HOSPITAL_COMMUNITY): Payer: Medicaid Other

## 2023-04-26 ENCOUNTER — Other Ambulatory Visit: Payer: Self-pay

## 2023-04-26 ENCOUNTER — Inpatient Hospital Stay (HOSPITAL_COMMUNITY)
Admission: EM | Admit: 2023-04-26 | Discharge: 2023-04-30 | DRG: 062 | Disposition: A | Discharge status: Rehabilitation Facility | Payer: Medicaid Other | Attending: Neurology | Admitting: Neurology

## 2023-04-26 DIAGNOSIS — I7409 Other arterial embolism and thrombosis of abdominal aorta: Secondary | ICD-10-CM | POA: Diagnosis present

## 2023-04-26 DIAGNOSIS — I739 Peripheral vascular disease, unspecified: Secondary | ICD-10-CM | POA: Diagnosis present

## 2023-04-26 DIAGNOSIS — I639 Cerebral infarction, unspecified: Secondary | ICD-10-CM | POA: Diagnosis not present

## 2023-04-26 DIAGNOSIS — G8191 Hemiplegia, unspecified affecting right dominant side: Secondary | ICD-10-CM | POA: Diagnosis present

## 2023-04-26 DIAGNOSIS — R29706 NIHSS score 6: Secondary | ICD-10-CM | POA: Diagnosis present

## 2023-04-26 DIAGNOSIS — I10 Essential (primary) hypertension: Secondary | ICD-10-CM

## 2023-04-26 DIAGNOSIS — I6523 Occlusion and stenosis of bilateral carotid arteries: Secondary | ICD-10-CM | POA: Diagnosis present

## 2023-04-26 DIAGNOSIS — F17200 Nicotine dependence, unspecified, uncomplicated: Secondary | ICD-10-CM | POA: Diagnosis not present

## 2023-04-26 DIAGNOSIS — R7989 Other specified abnormal findings of blood chemistry: Secondary | ICD-10-CM | POA: Diagnosis not present

## 2023-04-26 DIAGNOSIS — K760 Fatty (change of) liver, not elsewhere classified: Secondary | ICD-10-CM | POA: Diagnosis present

## 2023-04-26 DIAGNOSIS — I63522 Cerebral infarction due to unspecified occlusion or stenosis of left anterior cerebral artery: Secondary | ICD-10-CM | POA: Diagnosis present

## 2023-04-26 DIAGNOSIS — Z79899 Other long term (current) drug therapy: Secondary | ICD-10-CM | POA: Diagnosis not present

## 2023-04-26 DIAGNOSIS — I5022 Chronic systolic (congestive) heart failure: Secondary | ICD-10-CM | POA: Diagnosis present

## 2023-04-26 DIAGNOSIS — Z91148 Patient's other noncompliance with medication regimen for other reason: Secondary | ICD-10-CM | POA: Diagnosis not present

## 2023-04-26 DIAGNOSIS — F1210 Cannabis abuse, uncomplicated: Secondary | ICD-10-CM | POA: Diagnosis present

## 2023-04-26 DIAGNOSIS — E875 Hyperkalemia: Secondary | ICD-10-CM | POA: Diagnosis not present

## 2023-04-26 DIAGNOSIS — I252 Old myocardial infarction: Secondary | ICD-10-CM | POA: Diagnosis not present

## 2023-04-26 DIAGNOSIS — I6389 Other cerebral infarction: Secondary | ICD-10-CM

## 2023-04-26 DIAGNOSIS — F17210 Nicotine dependence, cigarettes, uncomplicated: Secondary | ICD-10-CM | POA: Diagnosis present

## 2023-04-26 DIAGNOSIS — E781 Pure hyperglyceridemia: Secondary | ICD-10-CM | POA: Diagnosis present

## 2023-04-26 DIAGNOSIS — I745 Embolism and thrombosis of iliac artery: Secondary | ICD-10-CM | POA: Diagnosis present

## 2023-04-26 DIAGNOSIS — Z955 Presence of coronary angioplasty implant and graft: Secondary | ICD-10-CM | POA: Diagnosis not present

## 2023-04-26 DIAGNOSIS — M4804 Spinal stenosis, thoracic region: Secondary | ICD-10-CM | POA: Diagnosis present

## 2023-04-26 DIAGNOSIS — Z833 Family history of diabetes mellitus: Secondary | ICD-10-CM | POA: Diagnosis not present

## 2023-04-26 DIAGNOSIS — E785 Hyperlipidemia, unspecified: Secondary | ICD-10-CM | POA: Diagnosis not present

## 2023-04-26 DIAGNOSIS — I25810 Atherosclerosis of coronary artery bypass graft(s) without angina pectoris: Secondary | ICD-10-CM | POA: Diagnosis not present

## 2023-04-26 DIAGNOSIS — Z8249 Family history of ischemic heart disease and other diseases of the circulatory system: Secondary | ICD-10-CM | POA: Diagnosis not present

## 2023-04-26 DIAGNOSIS — I110 Hypertensive heart disease with heart failure: Secondary | ICD-10-CM | POA: Diagnosis present

## 2023-04-26 DIAGNOSIS — I69351 Hemiplegia and hemiparesis following cerebral infarction affecting right dominant side: Secondary | ICD-10-CM | POA: Diagnosis not present

## 2023-04-26 DIAGNOSIS — I724 Aneurysm of artery of lower extremity: Secondary | ICD-10-CM | POA: Diagnosis present

## 2023-04-26 DIAGNOSIS — I161 Hypertensive emergency: Secondary | ICD-10-CM | POA: Diagnosis present

## 2023-04-26 DIAGNOSIS — G8929 Other chronic pain: Secondary | ICD-10-CM | POA: Diagnosis present

## 2023-04-26 DIAGNOSIS — I2510 Atherosclerotic heart disease of native coronary artery without angina pectoris: Secondary | ICD-10-CM | POA: Diagnosis present

## 2023-04-26 HISTORY — DX: Cerebral infarction due to unspecified occlusion or stenosis of left anterior cerebral artery: I63.522

## 2023-04-26 LAB — COMPREHENSIVE METABOLIC PANEL
ALT: 20 U/L (ref 0–44)
AST: 29 U/L (ref 15–41)
Albumin: 3.2 g/dL — ABNORMAL LOW (ref 3.5–5.0)
Alkaline Phosphatase: 59 U/L (ref 38–126)
Anion gap: 9 (ref 5–15)
BUN: 26 mg/dL — ABNORMAL HIGH (ref 6–20)
CO2: 23 mmol/L (ref 22–32)
Calcium: 8 mg/dL — ABNORMAL LOW (ref 8.9–10.3)
Chloride: 106 mmol/L (ref 98–111)
Creatinine, Ser: 1.01 mg/dL (ref 0.61–1.24)
GFR, Estimated: >60 mL/min (ref >60)
Glucose, Bld: 107 mg/dL — ABNORMAL HIGH (ref 70–99)
Potassium: 3.8 mmol/L (ref 3.5–5.1)
Sodium: 138 mmol/L (ref 135–145)
Total Bilirubin: 0.5 mg/dL (ref 0.3–1.2)
Total Protein: 5.8 g/dL — ABNORMAL LOW (ref 6.5–8.1)

## 2023-04-26 LAB — PROTIME-INR
INR: 1 (ref 0.8–1.2)
Prothrombin Time: 13.4 seconds (ref 11.4–15.2)

## 2023-04-26 LAB — RAPID URINE DRUG SCREEN, HOSP PERFORMED
Amphetamines: NOT DETECTED
Barbiturates: NOT DETECTED
Benzodiazepines: NOT DETECTED
Cocaine: NOT DETECTED
Opiates: NOT DETECTED
Tetrahydrocannabinol: POSITIVE — AB

## 2023-04-26 LAB — DIFFERENTIAL
Abs Immature Granulocytes: 0.03 10*3/uL (ref 0.00–0.07)
Basophils Absolute: 0.1 10*3/uL (ref 0.0–0.1)
Basophils Relative: 1 %
Eosinophils Absolute: 0.5 10*3/uL (ref 0.0–0.5)
Eosinophils Relative: 6 %
Immature Granulocytes: 0 %
Lymphocytes Relative: 33 %
Lymphs Abs: 2.7 10*3/uL (ref 0.7–4.0)
Monocytes Absolute: 0.9 10*3/uL (ref 0.1–1.0)
Monocytes Relative: 11 %
Neutro Abs: 3.9 10*3/uL (ref 1.7–7.7)
Neutrophils Relative %: 49 %

## 2023-04-26 LAB — ECHOCARDIOGRAM COMPLETE
Area-P 1/2: 4.17 cm2
Height: 67 in
MV M vel: 1.31 m/s
MV Peak grad: 6.9 mmHg
S' Lateral: 3.1 cm
Weight: 2458.57 [oz_av]

## 2023-04-26 LAB — URINALYSIS, ROUTINE W REFLEX MICROSCOPIC
Bilirubin Urine: NEGATIVE
Glucose, UA: NEGATIVE mg/dL
Hgb urine dipstick: NEGATIVE
Ketones, ur: NEGATIVE mg/dL
Leukocytes,Ua: NEGATIVE
Nitrite: NEGATIVE
Protein, ur: NEGATIVE mg/dL
Specific Gravity, Urine: <1.005 — ABNORMAL LOW (ref 1.005–1.030)
pH: 6 (ref 5.0–8.0)

## 2023-04-26 LAB — GLUCOSE, CAPILLARY
Glucose-Capillary: 112 mg/dL — ABNORMAL HIGH (ref 70–99)
Glucose-Capillary: 112 mg/dL — ABNORMAL HIGH (ref 70–99)
Glucose-Capillary: 113 mg/dL — ABNORMAL HIGH (ref 70–99)
Glucose-Capillary: 114 mg/dL — ABNORMAL HIGH (ref 70–99)
Glucose-Capillary: 117 mg/dL — ABNORMAL HIGH (ref 70–99)
Glucose-Capillary: 119 mg/dL — ABNORMAL HIGH (ref 70–99)

## 2023-04-26 LAB — BASIC METABOLIC PANEL
Anion gap: 10 (ref 5–15)
BUN: 17 mg/dL (ref 6–20)
CO2: 22 mmol/L (ref 22–32)
Calcium: 9 mg/dL (ref 8.9–10.3)
Chloride: 102 mmol/L (ref 98–111)
Creatinine, Ser: 1 mg/dL (ref 0.61–1.24)
GFR, Estimated: >60 mL/min (ref >60)
Glucose, Bld: 105 mg/dL — ABNORMAL HIGH (ref 70–99)
Potassium: 4.1 mmol/L (ref 3.5–5.1)
Sodium: 134 mmol/L — ABNORMAL LOW (ref 135–145)

## 2023-04-26 LAB — LDL CHOLESTEROL, DIRECT: Direct LDL: 166 mg/dL — ABNORMAL HIGH (ref 0–99)

## 2023-04-26 LAB — CBC
HCT: 46.4 % (ref 39.0–52.0)
Hemoglobin: 15 g/dL (ref 13.0–17.0)
MCH: 30.6 pg (ref 26.0–34.0)
MCHC: 32.3 g/dL (ref 30.0–36.0)
MCV: 94.7 fL (ref 80.0–100.0)
Platelets: 186 10*3/uL (ref 150–400)
RBC: 4.9 MIL/uL (ref 4.22–5.81)
RDW: 14.7 % (ref 11.5–15.5)
WBC: 8.1 10*3/uL (ref 4.0–10.5)
nRBC: 0 % (ref 0.0–0.2)

## 2023-04-26 LAB — LIPID PANEL
Cholesterol: 261 mg/dL — ABNORMAL HIGH (ref 0–200)
HDL: 34 mg/dL — ABNORMAL LOW (ref >40)
LDL Cholesterol: UNDETERMINED mg/dL (ref 0–99)
Total CHOL/HDL Ratio: 7.7 ratio
Triglycerides: 573 mg/dL — ABNORMAL HIGH (ref <150)
VLDL: UNDETERMINED mg/dL (ref 0–40)

## 2023-04-26 LAB — MRSA NEXT GEN BY PCR, NASAL: MRSA by PCR Next Gen: NOT DETECTED

## 2023-04-26 LAB — ETHANOL: Alcohol, Ethyl (B): <10 mg/dL (ref <10)

## 2023-04-26 LAB — HIV ANTIBODY (ROUTINE TESTING W REFLEX): HIV Screen 4th Generation wRfx: NONREACTIVE

## 2023-04-26 LAB — POCT I-STAT, CHEM 8
BUN: 36 mg/dL — ABNORMAL HIGH (ref 6–20)
Calcium, Ion: 1.04 mmol/L — ABNORMAL LOW (ref 1.15–1.40)
Chloride: 108 mmol/L (ref 98–111)
Creatinine, Ser: 1.2 mg/dL (ref 0.61–1.24)
Glucose, Bld: 116 mg/dL — ABNORMAL HIGH (ref 70–99)
HCT: 50 % (ref 39.0–52.0)
Hemoglobin: 17 g/dL (ref 13.0–17.0)
Potassium: 5.4 mmol/L — ABNORMAL HIGH (ref 3.5–5.1)
Sodium: 137 mmol/L (ref 135–145)
TCO2: 25 mmol/L (ref 22–32)

## 2023-04-26 LAB — APTT: aPTT: 31 s (ref 24–36)

## 2023-04-26 LAB — TRIGLYCERIDES: Triglycerides: 281 mg/dL — ABNORMAL HIGH (ref <150)

## 2023-04-26 MED ORDER — CLEVIDIPINE BUTYRATE 0.5 MG/ML IV EMUL
0.0000 mg/h | INTRAVENOUS | Status: DC
  Administered 2023-04-26: 12 mg/h via INTRAVENOUS
  Administered 2023-04-26: 2 mg/h via INTRAVENOUS
  Administered 2023-04-26: 11 mg/h via INTRAVENOUS
  Filled 2023-04-26 (×2): qty 100

## 2023-04-26 MED ORDER — STROKE: EARLY STAGES OF RECOVERY BOOK
Freq: Once | Status: AC
  Filled 2023-04-26: qty 1

## 2023-04-26 MED ORDER — LABETALOL HCL 5 MG/ML IV SOLN
20.0000 mg | Freq: Once | INTRAVENOUS | Status: AC
  Administered 2023-04-26: 20 mg via INTRAVENOUS

## 2023-04-26 MED ORDER — SODIUM CHLORIDE 0.9 % IV SOLN: INTRAVENOUS | Status: DC

## 2023-04-26 MED ORDER — ROSUVASTATIN CALCIUM 20 MG PO TABS
40.0000 mg | ORAL_TABLET | Freq: Every day | ORAL | Status: DC
  Administered 2023-04-26 – 2023-04-30 (×5): 40 mg via ORAL
  Filled 2023-04-26 (×5): qty 2

## 2023-04-26 MED ORDER — PANTOPRAZOLE SODIUM 40 MG PO TBEC
40.0000 mg | DELAYED_RELEASE_TABLET | Freq: Every day | ORAL | Status: DC
  Administered 2023-04-26 – 2023-04-29 (×4): 40 mg via ORAL
  Filled 2023-04-26 (×4): qty 1

## 2023-04-26 MED ORDER — TENECTEPLASE FOR STROKE: 0.2500 mg/kg | PACK | Freq: Once | INTRAVENOUS | Status: DC

## 2023-04-26 MED ORDER — CHLORHEXIDINE GLUCONATE CLOTH 2 % EX PADS
6.0000 | MEDICATED_PAD | Freq: Every day | CUTANEOUS | Status: DC
  Administered 2023-04-26 – 2023-04-28 (×4): 6 via TOPICAL

## 2023-04-26 MED ORDER — TRAMADOL HCL 50 MG PO TABS
50.0000 mg | ORAL_TABLET | Freq: Four times a day (QID) | ORAL | Status: DC | PRN
  Administered 2023-04-26 – 2023-04-30 (×6): 50 mg via ORAL
  Filled 2023-04-26 (×6): qty 1

## 2023-04-26 MED ORDER — LIDOCAINE 5 % EX PTCH
1.0000 | MEDICATED_PATCH | Freq: Every day | CUTANEOUS | Status: DC
  Administered 2023-04-26 – 2023-04-30 (×5): 1 via TRANSDERMAL
  Filled 2023-04-26 (×5): qty 1

## 2023-04-26 MED ORDER — IOHEXOL 350 MG/ML SOLN
100.0000 mL | Freq: Once | INTRAVENOUS | Status: AC | PRN
  Administered 2023-04-26: 100 mL via INTRAVENOUS

## 2023-04-26 MED ORDER — NICARDIPINE HCL IN NACL 20-0.86 MG/200ML-% IV SOLN
3.0000 mg/h | INTRAVENOUS | Status: DC
  Administered 2023-04-26: 7.5 mg/h via INTRAVENOUS
  Administered 2023-04-26 (×2): 5 mg/h via INTRAVENOUS
  Administered 2023-04-26: 7.5 mg/h via INTRAVENOUS
  Administered 2023-04-26: 5 mg/h via INTRAVENOUS
  Administered 2023-04-27 (×2): 7.5 mg/h via INTRAVENOUS
  Administered 2023-04-27: 10 mg/h via INTRAVENOUS
  Administered 2023-04-27 (×2): 7.5 mg/h via INTRAVENOUS
  Filled 2023-04-26 (×16): qty 200

## 2023-04-26 MED ORDER — TENECTEPLASE FOR STROKE
0.2500 mg/kg | PACK | Freq: Once | INTRAVENOUS | Status: AC
  Administered 2023-04-26: 18 mg via INTRAVENOUS
  Filled 2023-04-26: qty 10

## 2023-04-26 MED ORDER — ORAL CARE MOUTH RINSE: 15.0000 mL | OROMUCOSAL | Status: DC | PRN

## 2023-04-26 MED ORDER — NICOTINE 21 MG/24HR TD PT24
21.0000 mg | MEDICATED_PATCH | Freq: Every day | TRANSDERMAL | Status: DC | PRN
  Administered 2023-04-26 – 2023-04-29 (×4): 21 mg via TRANSDERMAL
  Filled 2023-04-26 (×4): qty 1

## 2023-04-26 MED ORDER — ACETAMINOPHEN 325 MG PO TABS
650.0000 mg | ORAL_TABLET | ORAL | Status: DC | PRN
  Administered 2023-04-26 – 2023-04-27 (×4): 650 mg via ORAL
  Filled 2023-04-26 (×5): qty 2

## 2023-04-26 MED ORDER — PANTOPRAZOLE SODIUM 40 MG IV SOLR
40.0000 mg | Freq: Every day | INTRAVENOUS | Status: DC
  Administered 2023-04-26: 40 mg via INTRAVENOUS
  Filled 2023-04-26: qty 10

## 2023-04-26 MED ORDER — ACETAMINOPHEN 160 MG/5ML PO SOLN: 650.0000 mg | ORAL | Status: DC | PRN

## 2023-04-26 MED ORDER — IOHEXOL 350 MG/ML SOLN
65.0000 mL | Freq: Once | INTRAVENOUS | Status: AC | PRN
  Administered 2023-04-26: 65 mL via INTRAVENOUS

## 2023-04-26 MED ORDER — ACETAMINOPHEN 650 MG RE SUPP: 650.0000 mg | RECTAL | Status: DC | PRN

## 2023-04-26 MED ORDER — SENNOSIDES-DOCUSATE SODIUM 8.6-50 MG PO TABS: 1.0000 | ORAL_TABLET | Freq: Every evening | ORAL | Status: DC | PRN

## 2023-04-26 NOTE — Progress Notes (Signed)
PHARMACIST CODE STROKE RESPONSE  Notified to mix TNK at 0042 by Dr. Derry Lory TNK preparation completed at 0045  TNK dose = 18 mg IV over 5 seconds  Issues/delays encountered (if applicable):  Hypertensive requiring labetalol and clevidipine  Dr. Derry Lory requested radiology opinion on CT to r/o dissection  Christoper Fabian, PharmD, BCPS Please see amion for complete clinical pharmacist phone list 04/26/23 2:09 AM

## 2023-04-26 NOTE — Progress Notes (Signed)
VASCULAR LAB    Carotid duplex has been performed.  See CV proc for preliminary results.   Abdoul Encinas, RVT 04/26/2023, 4:38 PM

## 2023-04-26 NOTE — ED Notes (Signed)
PT right leg and foot noted to be without pulse from the knee down.

## 2023-04-26 NOTE — Progress Notes (Signed)
Pt gluose on arrival was 112mg /dL

## 2023-04-26 NOTE — Progress Notes (Signed)
PT Cancellation Note  Patient Details Name: BOYDE HOVATER MRN: [id number] DOB: [birth year]   Cancelled Treatment:    Reason Eval/Treat Not Completed: Active bedrest order (s/p TNK)   Leeanna Slaby B Brentley Horrell 04/26/2023, 8:02 AM Merryl Hacker, PT Acute Rehabilitation Services Office: [phone]

## 2023-04-26 NOTE — Progress Notes (Signed)
Patient arrived with the following belongings shoes, clothes, change, eye glasses, and cell phone.

## 2023-04-26 NOTE — ED Provider Notes (Signed)
[place] EMERGENCY DEPARTMENT AT [facility] Provider Note  CSN: [id number] Arrival date & time: 04/25/23 2357  Chief Complaint(s) Code Stroke Pt was sitting in the drive through when he went to put his right foot on the gas pedal and was unable to life his leg.Pt has full sensory but no motor in the right leg from the hip down. Pt last know normal was at 2315. Pt has not saw any medical provider in more than 5 years. Pt does have a stent in his heart but is not on any medications. Pt has history of HTN BP 218/160, 80, 99.   HPI Lance Santiago is a 62 y.o. male with a past medical history listed below who presents to the emergency department as a code stroke for right leg weakness.  Last known normal 2315.  No associated headache, visual changes, other weaknesses or loss of sensation. No chest pain.  The history is provided by the patient and the EMS personnel.    Past Medical History Past Medical History:  Diagnosis Date   Coronary artery disease    Hyperlipidemia    Hypertension    Lumbar disc disease    Myocardial infarction (HCC)    9/18 PCI/DESx1 to RCA, EF 45%   Smoking    Systolic heart failure [facility])    Patient Active Problem List   Diagnosis Date Noted   Arterial ischemic stroke, ACA (anterior cerebral artery), left, acute (HCC) 04/26/2023   CAD S/P percutaneous coronary angioplasty 05/10/2017   PVD (peripheral vascular disease) with claudication (HCC) 05/10/2017   Hypertension 04/28/2017   Dyslipidemia 04/28/2017   Acute systolic heart failure (HCC) 04/28/2017   Tobacco abuse 04/26/2017   Cardiogenic shock (HCC) 04/26/2017   Acute ST elevation myocardial infarction (STEMI) of inferior wall (HCC) 04/25/2017   Acute ST elevation myocardial infarction (STEMI) involving right coronary artery (HCC) 04/25/2017   Complete heart block (HCC)    Home Medication(s) Prior to Admission medications   Medication Sig Start Date End Date Taking? Authorizing Provider   esomeprazole (NEXIUM) 10 MG packet Take 10 mg by mouth daily before breakfast.   Yes [provider]  naproxen sodium (ALEVE) 220 MG tablet Take 220 mg by mouth in the morning and at bedtime.   Yes [provider]                                                                                                                                    Allergies Penicillins  Review of Systems Review of Systems As noted in HPI  Physical Exam Vital Signs  I have reviewed the triage vital signs BP (!) 151/93   Pulse 93   Resp (!) 21   Ht 5\' 7"  (1.702 m)   Wt 70 kg   SpO2 92%   BMI 24.17 kg/m   Physical Exam Vitals reviewed.  Constitutional:      General: He is  not in acute distress.    Appearance: He is well-developed. He is not diaphoretic.  HENT:     Head: Normocephalic and atraumatic.     Nose: Nose normal.  Eyes:     General: No scleral icterus.       Right eye: No discharge.        Left eye: No discharge.     Conjunctiva/sclera: Conjunctivae normal.     Pupils: Pupils are equal, round, and reactive to light.  Cardiovascular:     Rate and Rhythm: Normal rate and regular rhythm.     Pulses:          Femoral pulses are 1+ on the right side.      Dorsalis pedis pulses are 0 on the right side and 1+ on the left side.       Posterior tibial pulses are 0 on the right side and 1+ on the left side.     Heart sounds: No murmur heard.    No friction rub. No gallop.  Pulmonary:     Effort: Pulmonary effort is normal. No respiratory distress.     Breath sounds: Normal breath sounds. No stridor. No rales.  Abdominal:     General: There is no distension.     Palpations: Abdomen is soft.     Tenderness: There is no abdominal tenderness.  Musculoskeletal:        General: No tenderness.     Cervical back: Normal range of motion and neck supple.     Right lower leg: Swelling present. No tenderness.     Right foot: Decreased capillary refill. Abnormal pulse.     Left  foot: Normal capillary refill.  Skin:    General: Skin is warm and dry.     Findings: No erythema or rash.  Neurological:     Mental Status: He is alert and oriented to person, place, and time.     Comments: RLE weakness. Intact sensation. Detailed neuro exam by neurology     ED Results and Treatments Labs (all labs ordered are listed, but only abnormal results are displayed) Labs Reviewed  COMPREHENSIVE METABOLIC PANEL - Abnormal; Notable for the following components:      Result Value   Glucose, Bld 107 (*)    BUN 26 (*)    Calcium 8.0 (*)    Total Protein 5.8 (*)    Albumin 3.2 (*)    All other components within normal limits  RAPID URINE DRUG SCREEN, HOSP PERFORMED - Abnormal; Notable for the following components:   Tetrahydrocannabinol POSITIVE (*)    All other components within normal limits  URINALYSIS, ROUTINE W REFLEX MICROSCOPIC - Abnormal; Notable for the following components:   Specific Gravity, Urine <1.005 (*)    All other components within normal limits  POCT I-STAT, CHEM 8 - Abnormal; Notable for the following components:   Potassium 5.4 (*)    BUN 36 (*)    Glucose, Bld 116 (*)    Calcium, Ion 1.04 (*)    All other components within normal limits  ETHANOL  PROTIME-INR  APTT  CBC  DIFFERENTIAL  HIV ANTIBODY (ROUTINE TESTING W REFLEX)  LIPID PANEL  HEMOGLOBIN A1C  I-STAT CHEM 8, ED  EKG  EKG Interpretation Date/Time: 04/26/2023  01:58:37    Ventricular Rate: 98   PR Interval: 160   QRS Duration: 91   QT Interval:354    QTC Calculation:452   R Axis: 77     Text Interpretation: sinus rhythm. Borderline inferolateral T wave abnormalities.         Radiology MR BRAIN WO CONTRAST  Result Date: 04/26/2023 CLINICAL DATA:  Right lower extremity weakness EXAM: MRI HEAD WITHOUT CONTRAST TECHNIQUE: Multiplanar, multiecho pulse  sequences of the brain and surrounding structures were obtained without intravenous contrast. COMPARISON:  None Available. FINDINGS: Tailored MRI protocol was utilized, consisting of axial and coronal diffusion-weighted imaging and sagittal T1-weighted imaging. There is abnormal diffusion restriction within the left anterior cerebral artery territory. No diffusion restriction associated with the old infarcts of the left parietal lobe, right temporal lobe and right cerebellum. IMPRESSION: Acute left anterior cerebral artery territory infarct. Electronically Signed   By: Deatra Robinson M.D.   On: 04/26/2023 01:05   CT ANGIO AO+BIFEM W & OR WO CONTRAST  Result Date: 04/26/2023 CLINICAL DATA:  Right leg claudication or ischemia EXAM: CT ANGIOGRAPHY OF ABDOMINAL AORTA WITH ILIOFEMORAL RUNOFF TECHNIQUE: Multidetector CT imaging of the abdomen, pelvis and lower extremities was performed using the standard protocol during bolus administration of intravenous contrast. Multiplanar CT image reconstructions and MIPs were obtained to evaluate the vascular anatomy. RADIATION DOSE REDUCTION: This exam was performed according to the departmental dose-optimization program which includes automated exposure control, adjustment of the mA and/or kV according to patient size and/or use of iterative reconstruction technique. CONTRAST:  OMNIPAQUE IOHEXOL 350 MG/ML SOLN COMPARISON:  Report from CT abdomen and pelvis 02/29/2014 FINDINGS: VASCULAR Aorta: Advanced predominantly noncalcified irregular atherosclerotic plaque throughout the abdominal aorta causing moderate narrowing near the aortic bifurcation. Celiac: Patent without aneurysm or dissection. SMA: Patent without aneurysm or dissection. Renals: Pain without aneurysm or dissection. IMA: Occluded at the origin with distal reconstitution. RIGHT Lower Extremity Inflow: Severe narrowing of the right common iliac artery just distal to the origin. Atherosclerotic plaque causes  additional areas of mild narrowing. No aneurysm or dissection. 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 Severe narrowing of the proximal superficial femoral and proximal profunda femoral arteries mild narrowing of the popliteal artery. Just distal to the common femoral artery bifurcation. Runoff: Patent three vessel runoff to the ankle. LEFT Lower Extremity Inflow: Occlusion of the left common iliac artery at the origin. Reconstitution of the internal and external iliac arteries. Outflow: Atherosclerotic plaque causes mild narrowing of the common and superficial femoral arteries as well as the popliteal artery. Patent profunda femoral artery. Runoff: Patent three vessel runoff to the ankle. Veins: No obvious venous abnormality within the limitations of this arterial phase study. Review of the MIP images confirms the above findings. NON-VASCULAR Lower chest: No acute abnormality. Hepatobiliary: Hepatic steatosis. Unremarkable gallbladder and biliary tree. Pancreas: Unremarkable. Spleen: Unremarkable. Adrenals/Urinary Tract: Normal adrenal glands. No urinary calculi or hydronephrosis. Unremarkable bladder. Stomach/Bowel: Bowel wall thickening and trace stranding about the sigmoid colon. Extensive sigmoid diverticula. No abscess or perforation. Normal caliber large and small bowel. Stomach is within normal limits. Lymphatic: No lymphadenopathy. Reproductive: Unremarkable. Other: No free intraperitoneal air. Musculoskeletal: Posterior fusion L4-L5.  No acute fracture. IMPRESSION: 1. 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 2. Severe narrowing of the right common iliac and superficial femoral arteries. 3. Patent three vessel runoff to the ankle  bilaterally. 4. 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 5. Extensive irregular predominantly noncalcified plaque throughout the infrarenal abdominal  aorta causing up to moderate narrowing at the aortic bifurcation. 6. Acute uncomplicated sigmoid diverticulitis. Consider follow-up colonoscopy following resolution of patient's acute symptoms to exclude underlying mass. 7. Hepatic steatosis. Aortic Atherosclerosis (ICD10-I70.0). These results were called by telephone at the time of interpretation on 04/26/2023 at 12:48 am to provider [facility] , who verbally acknowledged these results. Electronically Signed   By: Minerva Fester M.D.   On: 04/26/2023 00:54   CT ANGIO HEAD NECK W WO CM (CODE STROKE)  Result Date: 04/26/2023 CLINICAL DATA:  Right leg paresis EXAM: CT ANGIOGRAPHY HEAD AND NECK WITH AND WITHOUT CONTRAST TECHNIQUE: Multidetector CT imaging of the head and neck was performed using the standard protocol during bolus administration of intravenous contrast. Multiplanar CT image reconstructions and MIPs were obtained to evaluate the vascular anatomy. Carotid stenosis measurements (when applicable) are obtained utilizing NASCET criteria, using the distal internal carotid diameter as the denominator. RADIATION DOSE REDUCTION: This exam was performed according to the departmental dose-optimization program which includes automated exposure control, adjustment of the mA and/or kV according to patient size and/or use of iterative reconstruction technique. CONTRAST:  65mL OMNIPAQUE IOHEXOL 350 MG/ML SOLN COMPARISON:  None Available. FINDINGS: CTA NECK FINDINGS Aortic arch: Normal 3 vessel branching pattern. Minimal atherosclerotic calcification. Right carotid system: Mild plaque at the origin of the common carotid artery. Mixed density atherosclerosis at the carotid bifurcation that extends into the proximal internal carotid artery causes approximately 70% stenosis. The distal ICA is widely patent. Left carotid system: Mixed density atherosclerosis at the carotid bifurcation extending into the proximal ICA causes approximately 50% stenosis. The distal  ICA is normal. Vertebral arteries: Codominant. Mild calcification of the right vertebral artery origin without stenosis. Left vertebral artery origin is normal. The V1-V3 segments are normal. Skeleton: Negative Other neck: Negative. 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 There is bilateral atherosclerotic calcification without flow limiting stenosis. --Anterior cerebral arteries (ACA): Normal. --Middle cerebral arteries (MCA): Normal. Posterior circulation: --Vertebral arteries: Calcification of the proximal right V4 segment approximately 50% narrowing. --Inferior cerebellar arteries: Normal. --Basilar artery: Normal. --Superior cerebellar arteries: Normal. --Posterior cerebral arteries: Severe stenosis of the distal right P3 segment. There is also a severe stenosis of the left P1 segment. The left PCA is partially supplied by the posterior communicating artery. Venous sinuses: As permitted by contrast timing, patent. Anatomic variants: None Review of the MIP images confirms the above findings IMPRESSION: 1. No emergent large vessel occlusion. 2. Severe stenosis of the distal right P3 segment and the left P1 segment. 3. 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 4. Bilateral carotid bifurcation atherosclerosis with approximately 70% stenosis of the proximal right internal carotid artery and 50% stenosis of the proximal left internal carotid artery. Aortic atherosclerosis (ICD10-I70.0). Electronically Signed   By: Deatra Robinson M.D.   On: 04/26/2023 00:29   CT HEAD CODE STROKE WO CONTRAST  Result Date: 04/26/2023 CLINICAL DATA:  Code stroke.  Right leg paralysis EXAM: CT HEAD WITHOUT CONTRAST TECHNIQUE: Contiguous axial images were obtained  from the base of the skull through the vertex without intravenous contrast. RADIATION DOSE REDUCTION: This exam was performed according  to the departmental dose-optimization program which includes automated exposure control, adjustment of the mA and/or kV according to patient size and/or use of iterative reconstruction technique. COMPARISON:  None Available. FINDINGS: Brain: There is a focal area of hypoattenuation at the left frontoparietal junction, affecting the precentral gyrus. There is no associated mass effect on the left lateral ventricle. There are old infarcts in the inferior right temporal lobe and right cerebellum. There is no mass, hemorrhage or extra-axial collection. Vascular: There is atherosclerotic calcification of both internal carotid arteries at the skull base. Skull: Normal. Negative for fracture or focal lesion. Sinuses/Orbits: No acute finding. Other: None. ASPECTS [facility] Stroke Program Early CT Score) - Ganglionic level infarction (caudate, lentiform nuclei, internal capsule, insula, M1-M3 cortex): 7 - Supraganglionic infarction (M4-M6 cortex): 3 Total score (0-10 with 10 being normal): 10 IMPRESSION: 1. Focal area of hypoattenuation at the left frontoparietal junction, affecting the precentral gyrus, but without mass effect and most consistent with chronic infarct. 2. Old infarcts of the right cerebellum and inferior right temporal lobe. 3. ASPECTS is 10. These results were called by telephone at the time of interpretation on 04/26/2023 at 12:17 am to provider [facility] , who verbally acknowledged these results. Electronically Signed   By: Deatra Robinson M.D.   On: 04/26/2023 00:18    Medications Ordered in ED Medications  clevidipine (CLEVIPREX) infusion 0.5 mg/mL (12 mg/hr Intravenous Infusion Verify 04/26/23 0220)   stroke: early stages of recovery book (has no administration in time range)  0.9 %  sodium chloride infusion (has no administration in time range)   acetaminophen (TYLENOL) tablet 650 mg (has no administration in time range)    Or  acetaminophen (TYLENOL) 160 MG/5ML solution 650 mg (has no administration in time range)    Or  acetaminophen (TYLENOL) suppository 650 mg (has no administration in time range)  senna-docusate (Senokot-S) tablet 1 tablet (has no administration in time range)  pantoprazole (PROTONIX) injection 40 mg (has no administration in time range)  iohexol (OMNIPAQUE) 350 MG/ML injection 65 mL (65 mLs Intravenous Contrast Given 04/26/23 0014)  iohexol (OMNIPAQUE) 350 MG/ML injection 100 mL (100 mLs Intravenous Contrast Given 04/26/23 0034)  tenecteplase (TNKASE) injection for Stroke 18 mg (18 mg Intravenous Given 04/26/23 0108)  labetalol (NORMODYNE) injection 20 mg (20 mg Intravenous Given 04/26/23 0044)   Procedures .Critical Care  Performed by: Nira Conn, MD Authorized by: Nira Conn, MD   Critical care provider statement:    Critical care time (minutes):  30   Critical care was necessary to treat or prevent imminent or life-threatening deterioration of the following conditions:  CNS failure or compromise   Critical care was time spent personally by me on the following activities:  Development of treatment plan with patient or surrogate, discussions with consultants, evaluation of patient's response to treatment, examination of patient, ordering and review of laboratory studies, ordering and review of radiographic studies, ordering and performing treatments and interventions, pulse oximetry, re-evaluation of patient's condition and review of old charts   Care discussed with: admitting provider     (including critical care time) Medical Decision Making / ED Course   Medical Decision Making Amount and/or Complexity of Data Reviewed Labs: ordered. Decision-making details documented in ED Course. Radiology: ordered and independent interpretation performed. Decision-making details documented in  ED Course. ECG/medicine tests: ordered and independent interpretation performed. Decision-making details documented in ED Course.  Risk Drug therapy requiring intensive monitoring for toxicity. Decision regarding hospitalization.    Code  stroke with right lower extremity weakness Also suspicious for acute vascular occlusion of the right lower extremity Taken immediately to CT scan  Imaging was supportive of a left ACA stroke matching his right lower extremity weakness.  CTA lower extremity runoff notable for several areas of peripheral artery disease including occlusion of the left common iliac that reconstitutes.  Severe narrowing of the right common iliac as well as a thrombosed right common femoral artery.  There is patent three-vessel runoff distally bilaterally.  Patient has extensive aortic disease.  CBC without leukocytosis or anemia CMP without significant electrolyte derangements or renal insufficiency  Started on Cleviprex and given TNK for acute stroke.  Patient will be admitted to the ICU for close monitoring  Vascular surgery consulted for vascular disease.  No acute intervention needed at this time.  Recommended patient follow-up in clinic.     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  Despite best efforts to proofread,  errors can occur which can change the documentation meaning.    Nira Conn, MD 04/26/23 [phone]

## 2023-04-26 NOTE — Consult Note (Addendum)
Hospital Consult    Reason for Consult:  carotid artery stenosis, PAD Requesting Physician:  [facility] MRN #:  [id number]  History of Present Illness: This is a 62 y.o. male with past medical history significant for CAD with history of coronary stenting, hyperlipidemia, hypertension, tobacco abuse, and systolic heart failure.  He is being seen in consultation for carotid artery stenosis.  Workup included MRI brain which demonstrated acute left ACA territory infarct.  He is unable to lift his right leg off the bed.  He also has right shoulder and upper arm weakness.  CTA neck shows 50% stenosis of the proximal left ICA and 70% stenosis of the proximal right ICA.  He does not take aspirin or Plavix or statin.  He is an everyday smoker.  He was also found to have absent pulses on the left lower extremity.  CTA in the emergency department demonstrated left iliac artery occlusion.  He reports calf claudication after walking 2 blocks.  He denies rest pain or tissue loss.  He denies any right lower extremity symptoms.  Past Medical History:  Diagnosis Date   Coronary artery disease    Hyperlipidemia    Hypertension    Lumbar disc disease    Myocardial infarction (HCC)    9/18 PCI/DESx1 to RCA, EF 45%   Smoking    Systolic heart failure [facility])     Past Surgical History:  Procedure Laterality Date   CARDIAC CATHETERIZATION     CORONARY/GRAFT ACUTE MI REVASCULARIZATION N/A 04/25/2017   Procedure: Coronary/Graft Acute MI Revascularization;  Surgeon: Runell Gess, MD;  Location: [facility] INVASIVE CV LAB;  Service: Cardiovascular;  Laterality: N/A;   KNEE SURGERY     LEFT HEART CATH AND CORONARY ANGIOGRAPHY N/A 04/25/2017   Procedure: LEFT HEART CATH AND CORONARY ANGIOGRAPHY;  Surgeon: Runell Gess, MD;  Location: [facility] INVASIVE CV LAB;  Service: Cardiovascular;  Laterality: N/A;   LUMBAR LAMINECTOMY      Allergies  Allergen Reactions   Penicillins Other (See Comments)    Causes sore throat Has  patient had a PCN reaction causing immediate rash, facial/tongue/throat swelling, SOB or lightheadedness with hypotension: No Has patient had a PCN reaction causing severe rash involving mucus membranes or skin necrosis: No Has patient had a PCN reaction that required hospitalization: No Has patient had a PCN reaction occurring within the last 10 years: No If all of the above answers are "NO", then may proceed with Cephalosporin use.    Prior to Admission medications   Medication Sig Start Date End Date Taking? Authorizing Provider  esomeprazole (NEXIUM) 10 MG packet Take 10 mg by mouth daily before breakfast.   Yes [provider]  naproxen sodium (ALEVE) 220 MG tablet Take 220 mg by mouth in the morning and at bedtime.   Yes [provider]    Social History   Socioeconomic History   Marital status: Divorced    Spouse name: Not on file   Number of children: Not on file   Years of education: Not on file   Highest education level: Not on file  Occupational History   Not on file  Tobacco Use   Smoking status: Every Day    Current packs/day: 1.00    Average packs/day: 1 pack/day for 40.0 years (40.0 ttl pk-yrs)    Types: Cigarettes   Smokeless tobacco: Never  Substance and Sexual Activity   Alcohol use: Not on file   Drug use: Not on file   Sexual activity: Not  on file  Other Topics Concern   Not on file  Social History Narrative   Not on file   Social Determinants of Health   Financial Resource Strain: Not on file  Food Insecurity: Not on file  Transportation Needs: Not on file  Physical Activity: Not on file  Stress: Not on file  Social Connections: Not on file  Intimate Partner Violence: Not on file     Family History  Problem Relation Age of Onset   Diabetes Mother    Coronary artery disease Father     ROS: Otherwise negative unless mentioned in HPI  Physical Examination  Vitals:   04/26/23 1105 04/26/23 1110  BP:    Pulse: 100 100   Resp: (!) 22 19  Temp:    SpO2: 93% 92%   Body mass index is 24.07 kg/m.  General:  WDWN in NAD Gait: Not observed HENT: WNL, normocephalic Pulmonary: normal non-labored breathing, without Rales, rhonchi,  wheezing Cardiac: regular Abdomen:  soft, NT/ND, no masses Skin: without rashes Vascular Exam/Pulses: Palpable right PT pulse; absent left pedal pulses however foot is warm to touch with motor and sensation intact Extremities: without ischemic changes, without Gangrene , without cellulitis; without open wounds;  Musculoskeletal: no muscle wasting or atrophy  Neurologic: A&O X 3; right upper arm weakness; grip strength symmetrical; unable to lift right leg off the bed Psychiatric:  The pt has Normal affect. Lymph:  Unremarkable  CBC    Component Value Date/Time   WBC 8.1 04/25/2023 2358   RBC 4.90 04/25/2023 2358   HGB 17.0 04/26/2023 0003   HCT 50.0 04/26/2023 0003   PLT 186 04/25/2023 2358   MCV 94.7 04/25/2023 2358   MCH 30.6 04/25/2023 2358   MCHC 32.3 04/25/2023 2358   RDW 14.7 04/25/2023 2358   LYMPHSABS 2.7 04/25/2023 2358   MONOABS 0.9 04/25/2023 2358   EOSABS 0.5 04/25/2023 2358   BASOSABS 0.1 04/25/2023 2358    BMET    Component Value Date/Time   NA 137 04/26/2023 0003   K 5.4 (H) 04/26/2023 0003   CL 108 04/26/2023 0003   CO2 23 04/25/2023 2358   GLUCOSE 116 (H) 04/26/2023 0003   BUN 36 (H) 04/26/2023 0003   CREATININE 1.20 04/26/2023 0003   CALCIUM 8.0 (L) 04/25/2023 2358   GFRNONAA >60 04/25/2023 2358   GFRAA >60 04/27/2017 0243    COAGS: Lab Results  Component Value Date   INR 1.0 04/25/2023     Non-Invasive Vascular Imaging:   MR brain demonstrates left ACA territory infarct  CTA neck with 70% stenosis of the right ICA and 50% stenosis of the left ICA  CTA aorta with runoff: Left common iliac artery occlusion; severe narrowing of the right common iliac and SFA    ASSESSMENT/PLAN: This is a 62 y.o. male with left ACA territory  infarct; carotid artery stenosis; PAD  Mr. Lance Santiago is a 62 year old male who came in the emergency department with right arm and leg weakness.  Workup included MRI brain which was positive for left ACA territory infarct.  CTA neck demonstrates 50% stenosis of the left ICA.  He also has 70% stenosis of the right ICA.  We will check a carotid duplex to evaluate velocities of the left ICA stenosis to see if he would be a candidate for revascularization.  We also discussed tobacco cessation.  He will also likely need to be on aspirin and statin.  He will also potentially need to be on Plavix.  On exam he was also noted to have absent pulses in the left lower extremity in the emergency department.  CTA aorta demonstrates an occluded left common iliac artery.  Despite aortoiliac occlusive disease on the right side as well as infrainguinal occlusive disease he does have a palpable right PT pulse.  Despite occluded left common iliac artery he only claudicate after walking 2 blocks.  He is without rest pain or tissue loss.  PAD can be worked up further as an outpatient.  On-call vascular surgeon Dr. Randie Heinz will evaluate the patient later today and provide further treatment plans.   Emilie Rutter PA-C [facility] [phone]  I have independently interviewed and examined patient and agree with PA assessment and plan above.  I have discussed with Dr. Roda Shutters with neurology and left ICA lesion really not terribly stenotic actually appears less so than the right although he does have an intracranial lesion on the left which may have also led to his symptoms.  Will evaluate with duplex and continue medical therapy.  Lafern Brinkley C. Randie Heinz, MD [facility] [place] Office: [phone] Pager: [phone]

## 2023-04-26 NOTE — Code Documentation (Addendum)
Responded to Code Stroke called at 2336 for R leg paralysis, [id number]. Pt arrived at 2357, CBG-116, NIH-4, CT head negative for acute changes, CTA-no LVO. Pt transported to MRI at 0032. MRI-Acute L ACA territory infarct.  Decision made to give TNK at 0042 however BP was not in parameters. 20mg  labetalol given at 0044. At 0045, pt's NIH increased from 4>7 d/t new onset R arm weakness. Pt transported back to ED room where Cleviprex gtt started.. TNK given at 0108 once BP within parameters. Plan: VS/NIH q59m x 2 hours, q61min x 6 hours, then q1h x16 hours, SBP<180/105, and ICU admission.

## 2023-04-26 NOTE — Progress Notes (Addendum)
STROKE TEAM PROGRESS NOTE   BRIEF HPI Mr. Lance Santiago is a 62 y.o. male with history of CAD, HTN, HLD, tobacco use disorder (47 pack-years) who presents with acute RLE weakness. Pulses absent, cold to touch. CT Stroke showed multiple chronic infarcts. Non-compliant with home meds. CT stroke: no LVO. CTA femoral showed significant occlusion of bilateral femoral arteries L>R. MRI showed L ACA infarct. TNK given @0045  9/16.    SIGNIFICANT HOSPITAL EVENTS 9/16: Admitted to Hospital, significant bilateral femoral artery occlusion, MRI showed left ACA infarct, TNK given 12:45 AM  INTERIM HISTORY/SUBJECTIVE  On interview, patient is sitting up in bed, attentive.  He says that he takes no medications at home.  Has previously quit tobacco cold [place], without nicotine replacement therapy 2 years ago.  Feels confident that he can do it again.  Notes slipped disks in his back, would like to sit up on the edge of his bed.  Will give tramadol/lidocaine to ease his pain until better sores can be rescinded.  No further concerns.  OBJECTIVE  CBC    Component Value Date/Time   WBC 8.1 04/25/2023 2358   RBC 4.90 04/25/2023 2358   HGB 17.0 04/26/2023 0003   HCT 50.0 04/26/2023 0003   PLT 186 04/25/2023 2358   MCV 94.7 04/25/2023 2358   MCH 30.6 04/25/2023 2358   MCHC 32.3 04/25/2023 2358   RDW 14.7 04/25/2023 2358   LYMPHSABS 2.7 04/25/2023 2358   MONOABS 0.9 04/25/2023 2358   EOSABS 0.5 04/25/2023 2358   BASOSABS 0.1 04/25/2023 2358    BMET    Component Value Date/Time   NA 137 04/26/2023 0003   K 5.4 (H) 04/26/2023 0003   CL 108 04/26/2023 0003   CO2 23 04/25/2023 2358   GLUCOSE 116 (H) 04/26/2023 0003   BUN 36 (H) 04/26/2023 0003   CREATININE 1.20 04/26/2023 0003   CALCIUM 8.0 (L) 04/25/2023 2358   GFRNONAA >60 04/25/2023 2358    IMAGING past 24 hours CT HEAD WO CONTRAST ( )  Result Date: 04/26/2023 CLINICAL DATA:  Right arm paralysis after T NK. EXAM: CT HEAD WITHOUT  CONTRAST TECHNIQUE: Contiguous axial images were obtained from the base of the skull through the vertex without intravenous contrast. RADIATION DOSE REDUCTION: This exam was performed according to the departmental dose-optimization program which includes automated exposure control, adjustment of the mA and/or kV according to patient size and/or use of iterative reconstruction technique. COMPARISON:  None Available. FINDINGS: Brain: No intracranial hemorrhage. Otherwise unchanged appearance of the brain. Vascular: There is contrast enhancement of the dural venous sinuses secondary to residual intravenous contrast agent from earlier CTA. Skull: Normal Sinuses/Orbits: Clear sinuses and normal orbits Other: None IMPRESSION: 1. No intracranial hemorrhage. Electronically Signed   By: Deatra Robinson M.D.   On: 04/26/2023 02:42   MR BRAIN WO CONTRAST  Result Date: 04/26/2023 CLINICAL DATA:  Right lower extremity weakness EXAM: MRI HEAD WITHOUT CONTRAST TECHNIQUE: Multiplanar, multiecho pulse sequences of the brain and surrounding structures were obtained without intravenous contrast. COMPARISON:  None Available. FINDINGS: Tailored MRI protocol was utilized, consisting of axial and coronal diffusion-weighted imaging and sagittal T1-weighted imaging. There is abnormal diffusion restriction within the left anterior cerebral artery territory. No diffusion restriction associated with the old infarcts of the left parietal lobe, right temporal lobe and right cerebellum. IMPRESSION: Acute left anterior cerebral artery territory infarct. Electronically Signed   By: Deatra Robinson M.D.   On: 04/26/2023 01:05   CT ANGIO AO+BIFEM W & OR  WO CONTRAST  Result Date: 04/26/2023 CLINICAL DATA:  Right leg claudication or ischemia EXAM: CT ANGIOGRAPHY OF ABDOMINAL AORTA WITH ILIOFEMORAL RUNOFF TECHNIQUE: Multidetector CT imaging of the abdomen, pelvis and lower extremities was performed using the standard protocol during bolus  administration of intravenous contrast. Multiplanar CT image reconstructions and MIPs were obtained to evaluate the vascular anatomy. RADIATION DOSE REDUCTION: This exam was performed according to the departmental dose-optimization program which includes automated exposure control, adjustment of the mA and/or kV according to patient size and/or use of iterative reconstruction technique. CONTRAST:  OMNIPAQUE IOHEXOL 350 MG/ML SOLN COMPARISON:  Report from CT abdomen and pelvis 02/29/2014 FINDINGS: VASCULAR Aorta: Advanced predominantly noncalcified irregular atherosclerotic plaque throughout the abdominal aorta causing moderate narrowing near the aortic bifurcation. Celiac: Patent without aneurysm or dissection. SMA: Patent without aneurysm or dissection. Renals: Pain without aneurysm or dissection. IMA: Occluded at the origin with distal reconstitution. RIGHT Lower Extremity Inflow: Severe narrowing of the right common iliac artery just distal to the origin. Atherosclerotic plaque causes additional areas of mild narrowing. No aneurysm or dissection. 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 Severe narrowing of the proximal superficial femoral and proximal profunda femoral arteries mild narrowing of the popliteal artery. Just distal to the common femoral artery bifurcation. Runoff: Patent three vessel runoff to the ankle. LEFT Lower Extremity Inflow: Occlusion of the left common iliac artery at the origin. Reconstitution of the internal and external iliac arteries. Outflow: Atherosclerotic plaque causes mild narrowing of the common and superficial femoral arteries as well as the popliteal artery. Patent profunda femoral artery. Runoff: Patent three vessel runoff to the ankle. Veins: No obvious venous abnormality within the limitations of this arterial phase study. Review of the MIP images confirms the above findings. NON-VASCULAR Lower chest: No acute abnormality.  Hepatobiliary: Hepatic steatosis. Unremarkable gallbladder and biliary tree. Pancreas: Unremarkable. Spleen: Unremarkable. Adrenals/Urinary Tract: Normal adrenal glands. No urinary calculi or hydronephrosis. Unremarkable bladder. Stomach/Bowel: Bowel wall thickening and trace stranding about the sigmoid colon. Extensive sigmoid diverticula. No abscess or perforation. Normal caliber large and small bowel. Stomach is within normal limits. Lymphatic: No lymphadenopathy. Reproductive: Unremarkable. Other: No free intraperitoneal air. Musculoskeletal: Posterior fusion L4-L5.  No acute fracture. IMPRESSION: 1. 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 2. Severe narrowing of the right common iliac and superficial femoral arteries. 3. Patent three vessel runoff to the ankle bilaterally. 4. 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 5. Extensive irregular predominantly noncalcified plaque throughout the infrarenal abdominal aorta causing up to moderate narrowing at the aortic bifurcation. 6. Acute uncomplicated sigmoid diverticulitis. Consider follow-up colonoscopy following resolution of patient's acute symptoms to exclude underlying mass. 7. Hepatic steatosis. Aortic Atherosclerosis (ICD10-I70.0). These results were called by telephone at the time of interpretation on 04/26/2023 at 12:48 am to provider [facility] , who verbally acknowledged these results. Electronically Signed   By: Minerva Fester M.D.   On: 04/26/2023 00:54   CT ANGIO HEAD NECK W WO CM (CODE STROKE)  Result Date: 04/26/2023 CLINICAL DATA:  Right leg paresis EXAM: CT ANGIOGRAPHY HEAD AND NECK WITH AND WITHOUT CONTRAST TECHNIQUE: Multidetector CT imaging of the head and neck was performed using the standard protocol during bolus administration of intravenous contrast. Multiplanar CT image reconstructions and MIPs were obtained to evaluate the vascular  anatomy. Carotid stenosis measurements (when applicable) are obtained utilizing NASCET criteria, using the distal internal carotid diameter as  the denominator. RADIATION DOSE REDUCTION: This exam was performed according to the departmental dose-optimization program which includes automated exposure control, adjustment of the mA and/or kV according to patient size and/or use of iterative reconstruction technique. CONTRAST:  65mL OMNIPAQUE IOHEXOL 350 MG/ML SOLN COMPARISON:  None Available. FINDINGS: CTA NECK FINDINGS Aortic arch: Normal 3 vessel branching pattern. Minimal atherosclerotic calcification. Right carotid system: Mild plaque at the origin of the common carotid artery. Mixed density atherosclerosis at the carotid bifurcation that extends into the proximal internal carotid artery causes approximately 70% stenosis. The distal ICA is widely patent. Left carotid system: Mixed density atherosclerosis at the carotid bifurcation extending into the proximal ICA causes approximately 50% stenosis. The distal ICA is normal. Vertebral arteries: Codominant. Mild calcification of the right vertebral artery origin without stenosis. Left vertebral artery origin is normal. The V1-V3 segments are normal. Skeleton: Negative Other neck: Negative. 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 There is bilateral atherosclerotic calcification without flow limiting stenosis. --Anterior cerebral arteries (ACA): Normal. --Middle cerebral arteries (MCA): Normal. Posterior circulation: --Vertebral arteries: Calcification of the proximal right V4 segment approximately 50% narrowing. --Inferior cerebellar arteries: Normal. --Basilar artery: Normal. --Superior cerebellar arteries: Normal. --Posterior cerebral arteries:  Severe stenosis of the distal right P3 segment. There is also a severe stenosis of the left P1 segment. The left PCA is partially supplied by the posterior communicating artery. Venous sinuses: As permitted by contrast timing, patent. Anatomic variants: None Review of the MIP images confirms the above findings IMPRESSION: 1. No emergent large vessel occlusion. 2. Severe stenosis of the distal right P3 segment and the left P1 segment. 3. 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 4. Bilateral carotid bifurcation atherosclerosis with approximately 70% stenosis of the proximal right internal carotid artery and 50% stenosis of the proximal left internal carotid artery. Aortic atherosclerosis (ICD10-I70.0). Electronically Signed   By: Deatra Robinson M.D.   On: 04/26/2023 00:29   CT HEAD CODE STROKE WO CONTRAST  Result Date: 04/26/2023 CLINICAL DATA:  Code stroke.  Right leg paralysis EXAM: CT HEAD WITHOUT CONTRAST TECHNIQUE: Contiguous axial images were obtained from the base of the skull through the vertex without intravenous contrast. RADIATION DOSE REDUCTION: This exam was performed according to the departmental dose-optimization program which includes automated exposure control, adjustment of the mA and/or kV according to patient size and/or use of iterative reconstruction technique. COMPARISON:  None Available. FINDINGS: Brain: There is a focal area of hypoattenuation at the left frontoparietal junction, affecting the precentral gyrus. There is no associated mass effect on the left lateral ventricle. There are old infarcts in the inferior right temporal lobe and right cerebellum. There is no mass, hemorrhage or extra-axial collection. Vascular: There is atherosclerotic calcification of both internal carotid arteries at the skull base. Skull: Normal. Negative for fracture or focal lesion. Sinuses/Orbits: No acute finding. Other: None. ASPECTS [facility] Stroke Program  Early CT Score) - Ganglionic level infarction (caudate, lentiform nuclei, internal capsule, insula, M1-M3 cortex): 7 - Supraganglionic infarction (M4-M6 cortex): 3 Total score (0-10 with 10 being normal): 10 IMPRESSION: 1. Focal area of hypoattenuation at the left frontoparietal junction, affecting the precentral gyrus, but without mass effect and most consistent with chronic infarct. 2. Old infarcts of the right cerebellum and inferior right temporal  lobe. 3. ASPECTS is 10. These results were called by telephone at the time of interpretation on 04/26/2023 at 12:17 am to provider [facility] , who verbally acknowledged these results. Electronically Signed   By: Deatra Robinson M.D.   On: 04/26/2023 00:18    Vitals:   04/26/23 9528 04/26/23 0700 04/26/23 0708 04/26/23 0753  BP: (!) 157/89 (!) 149/107 (!) 149/107   Pulse:  92    Resp:  14    Temp:    97.7 F (36.5 C)  TempSrc:    Oral  SpO2:  92%    Weight:      Height:         PHYSICAL EXAM General: Alert male sitting up in bed, no acute distress, Psych:  Mood and affect appropriate for situation CV: Regular rate and rhythm on monitor Respiratory:  Regular, unlabored respirations on room air GI: Abdomen soft and nontender   NEURO:  Mental Status: AA&Ox3, patient is able to give clear and coherent history Speech/Language: speech is without dysarthria or aphasia.  Naming, repetition, fluency, and comprehension intact.  Cranial Nerves:  II: PERRL. Visual fields full.  III, IV, VI: EOMI. Eyelids elevate symmetrically.  V: Sensation is intact to light touch and symmetrical to face.  VII: Face is symmetrical resting and smiling VIII: hearing intact to voice. IX, X: Palate elevates symmetrically. Phonation is normal.  UX:[id number] shrug 5/5. XII: tongue is midline without fasciculations. Motor: Right upper extremity 3-/5, right lower extremity 1/5.  Left side normal. Tone: is normal and bulk is normal Sensation: Intact to light touch  bilaterally. Extinction absent to light touch to DSS.   Coordination: FTN intact on left, right side not testable Gait: deferred   ASSESSMENT/PLAN Mr. Lindner appeared at baseline today, with profound right-sided weakness.  Patient has significant stenosis in distal right T3 and left P1 as well as a left ICA aneurysm.  Also has significant carotid disease: 70% right proximal ICA stenosis.  Ordered echo and carotid ultrasound bilaterally, awaiting reads.  Patient was hypertensive on admission, will continue IV hypertension control with Cardene drip in place of Cleviprex.  Have discontinued normal saline to avoid fluid overload.  Somewhat permissive blood pressure goal, as patient has significant stenosis.  Transition to PO blood pressure meds before DC.  We will also begin Crestor 40 empirically, as LDLs cannot be measured in the setting of high triglycerides.    On physical exam, right upper extremity weakness is more proximal: front deltoid 2/5, bicep 4-/5, triceps 3/5.  R finger movement and strength intact.  Minimal movement in right lower extremity. Sensation intact bilaterally.  Patient has severe PAD, bilateral lower extremities are warm, with pulses present.  We will begin aspirin 81 if 24-hour post TNK scan is WNL.  We will DC on this +/- Plavix for 3 months.  Patient will need extensive follow-up with PCP and vascular surgery for his PAD.  Patient has other risk factors: Tobacco use disorder, cannabis use disorder, which need to be treated.  Patient said that he will quit tobacco, does not want NRT.  Would like PT/OT to help with his slipped discs/back pain.  We have added tramadol and lidocaine in the meantime.   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 ASPECTS 10.    CTA head & neck: No LVO. Severe stenosis: distal R P3 and L P1. 70% R proximal ICA stenosis. 50% L proximal ICA stenosis and moderate to severe left ICA siphon stenosis.    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 Occlusion of L common iliac artery, severe narrowing of R common iliac. 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 LDL 166, goal < 70  TG 573->281 Added rosuvastatin 40 Continue statin on discharge  Tobacco Abuse 40+ pack year tobacco use history Nicotine patch PRN tobacco cessation education provided Pt is willing to quit  Other Stroke Risk Factors Coronary artery disease THC abuse, cessation education provided  Other problems Hyperkalemia to 5.4: Resolved to 4.1 today Hx of back pain - tylenol and tramadol PRN  Hospital day # 0  Luiz Iron, MD Psychiatry Resident, PGY-1   ATTENDING NOTE: I reviewed above note and agree with the assessment and plan. Pt was seen and examined.   No family at the bedside. Pt sitting in bed, complaining of back pain and hx of herniated discs. AAO x 3, no aphasia, fluent language, following all simple commands. Able to name and repeat. No gaze palsy, tracking  bilaterally, visual field full. No facial droop. Tongue midline. RUE deltoid 1/5, bicep 2/5, tricep 0/5, but finger grip 5/5. RLE 0/5 proximal and distally. LUE and LLE 5/5. Sensation symmetrical bilaterally, left FTN intact, gait not tested.   Pt has multiple uncontrolled risk factors including HTN, HLD, PVD, heavy smoker, THC use. He does have left ICA bulb and siphon tandem stenosis, which I think likely to be responsible for his current stroke. Discussed with Dr. Randie Heinz VVS, do not need carotid bulb intervention. Will need aggressive risk factor modification and medical management. Pt will be on DAPT for 3 months, and high dose statin. Smoking and THC cessation education provided. PT and OT pending  For detailed assessment and plan, please refer to above/below as I have made changes wherever appropriate.   Marvel Plan, MD PhD Stroke Neurology 04/26/2023 5:24 PM  This patient is critically ill due to left ACA infarct s/p TNK, severe BLE PAD and at significant risk of neurological worsening, death form recurrent stroke, hemorrhagic conversion, bleeding from TNK, leg ischemia. This patient's care requires constant monitoring of vital signs, hemodynamics, respiratory and cardiac monitoring, review of multiple databases, neurological assessment, discussion with family, other specialists and medical decision making of high complexity. I spent 30 minutes of neurocritical care time in the care of this patient.   To contact Stroke Continuity provider, please refer to [email or web address]. After hours, contact General Neurology

## 2023-04-26 NOTE — ED Triage Notes (Signed)
Pt was sitting in the drive through when he went to put his right foot on the gas pedal and was unable to life his leg.Pt has full sensory but no motor in the right leg from the hip down. Pt last know normal was at 2315. Pt has not saw any medical provider in more than 5 years. Pt does have a stent in his heart but is not on any medications. Pt has history of HTN BP 218/160, 80, 99.

## 2023-04-26 NOTE — Progress Notes (Signed)
Pt eating, refused exam until later time.

## 2023-04-26 NOTE — Progress Notes (Signed)
eLink Physician-Brief Progress Note Patient Name: Lance Santiago DOB: July 06, 1961 MRN: [id number]   Date of Service  04/26/2023  HPI/Events of Note  61/M with CAD, HTN, dyslipidemia, active smoker, presenting with acute onset R sided weakness.  MRI revealing L ACA stroke.  Pt given labetalol and cleviprex as pt was hypertensive prior to TNK.   Pt is awake and alert, not in distress.  He has dense hemiparesis R.  BP 151/90, HR 92, RR 21, O2 sats 91%.   eICU Interventions  Allow permissive hypertension.  Continue cleviprex keep SBP <180, DBP <105.  Swallow evaluation.  Check lipid panel.  PT/OT.  Check 2d echo with bubble study.  Neuro checks q1hr.  SCDs for DVT prophylaxis.  Pantoprazole for GI prophylaxis.       Intervention Category Evaluation Type: New Patient Evaluation  Larinda Buttery 04/26/2023, 3:04 AM

## 2023-04-26 NOTE — Progress Notes (Signed)
  Echocardiogram 2D Echocardiogram has been performed.  Lance Santiago 04/26/2023, 1:28 PM

## 2023-04-26 NOTE — Progress Notes (Signed)
OT Cancellation Note  Patient Details Name: Lance Santiago MRN: [id number] DOB: 05/14/1961   Cancelled Treatment:    Reason Eval/Treat Not Completed: Active bedrest order (TNK given at 0042 on 9/16)  Ticara Waner D Causey 04/26/2023, 7:49 AM

## 2023-04-26 NOTE — H&P (Addendum)
NEUROLOGY CONSULTATION NOTE   Date of service: April 26, 2023 Patient Name: Lance Santiago MRN:  [id number] DOB:  Jan 13, 1961 _ _ _   _ __   _ __ _ _  __ __   _ __   __ _  History of Present Illness  Lance Santiago is a 62 y.o. male with PMH significant for CAD, HTN, HLD, smoker 1ppd for about 47 years who presents with acute onset RLE weakness.  Was with his significant other and dropping her off at the hotel where she works. They got to [employer] to grab some food prior to work. In the parking lot, noticed R leg weakness. Tried to put his foot on the gas pedal but could not move his foot.  Called EMS and was brought in as a code stroke. Intially noted that R leg cold to touch, pale and unable to palpate any pulses.  Taken to CT where CT Head demonstrated prior L posterior frontal and R cerebellar and R posterior temporal lobe encephalomalacia consistent with chronic infarcts. There are no other prior head imaging to compare. Patient denies any prior strokes.  CTA with R ICA 70% and L ICA 50% stenosis at the bifurcation along with right distal P3 and left P1 severe stenosis with good flow distally.  CT Angio runoff was obtained for concern for dissection vs severe PAD, specially with the noted poor capillary refill in R toes and no palpable pulses. CT Angio was negative for dissection. He denies any pain in his R leg or claudication.  MRI brain was obtained which demonstrated developing acute L ACA stroke and he was offered and given tnakse.  LKW: 2315 mRS: 0 tNKASE: given. Discussed risks, benefits, mode of administrations with patient. Also discussed that alternatively, we can observe for improvement. Patient understands risk of hemorrhage and opted for tnakse given the disabling nature of his R leg weakness. Thrombectomy: not offered 2/2 no LVO. NIHSS components Score: Comment  1a Level of Conscious 0[x]  1[]  2[]  3[]      1b LOC Questions 0[x]  1[]  2[]       1c LOC Commands  0[x]  1[]  2[]       2 Best Gaze 0[x]  1[]  2[]       3 Visual 0[x]  1[]  2[]  3[]      4 Facial Palsy 0[x]  1[]  2[]  3[]      5a Motor Arm - left 0[x]  1[]  2[]  3[]  4[]  UN[]    5b Motor Arm - Right 0[]  1[]  2[x]  3[]  4[]  UN[]    6a Motor Leg - Left 0[x]  1[]  2[]  3[]  4[]  UN[]    6b Motor Leg - Right 0[]  1[]  2[]  3[]  4[x]  UN[]    7 Limb Ataxia 0[x]  1[]  2[]  3[]  UN[]     8 Sensory 0[x]  1[]  2[]  UN[]      9 Best Language 0[x]  1[]  2[]  3[]      10 Dysarthria 0[x]  1[]  2[]  UN[]      11 Extinct. and Inattention 0[x]  1[]  2[]       TOTAL: 6        ROS   Constitutional Denies weight loss, fever and chills.   HEENT Denies changes in vision and hearing.   Respiratory Denies SOB and cough.   CV Denies palpitations and CP   GI Denies abdominal pain, nausea, vomiting and diarrhea.   GU Denies dysuria and urinary frequency.   MSK Denies myalgia and joint pain.   Skin Denies rash and pruritus.   Neurological Denies headache and syncope.   Psychiatric Denies recent changes in  mood. Denies anxiety and depression.    Past History   Past Medical History:  Diagnosis Date   Coronary artery disease    Hyperlipidemia    Hypertension    Lumbar disc disease    Myocardial infarction (HCC)    9/18 PCI/DESx1 to RCA, EF 45%   Smoking    Systolic heart failure [facility])    Past Surgical History:  Procedure Laterality Date   CARDIAC CATHETERIZATION     CORONARY/GRAFT ACUTE MI REVASCULARIZATION N/A 04/25/2017   Procedure: Coronary/Graft Acute MI Revascularization;  Surgeon: Runell Gess, MD;  Location: [facility] INVASIVE CV LAB;  Service: Cardiovascular;  Laterality: N/A;   KNEE SURGERY     LEFT HEART CATH AND CORONARY ANGIOGRAPHY N/A 04/25/2017   Procedure: LEFT HEART CATH AND CORONARY ANGIOGRAPHY;  Surgeon: Runell Gess, MD;  Location: [facility] INVASIVE CV LAB;  Service: Cardiovascular;  Laterality: N/A;   LUMBAR LAMINECTOMY     Family History  Problem Relation Age of Onset   Diabetes Mother    Coronary artery disease Father     Social History   Socioeconomic History   Marital status: Divorced    Spouse name: Not on file   Number of children: Not on file   Years of education: Not on file   Highest education level: Not on file  Occupational History   Not on file  Tobacco Use   Smoking status: Every Day    Current packs/day: 1.00    Average packs/day: 1 pack/day for 40.0 years (40.0 ttl pk-yrs)    Types: Cigarettes   Smokeless tobacco: Never  Substance and Sexual Activity   Alcohol use: Not on file   Drug use: Not on file   Sexual activity: Not on file  Other Topics Concern   Not on file  Social History Narrative   Not on file   Social Determinants of Health   Financial Resource Strain: Not on file  Food Insecurity: Not on file  Transportation Needs: Not on file  Physical Activity: Not on file  Stress: Not on file  Social Connections: Not on file   Allergies  Allergen Reactions   Penicillins Other (See Comments)    Causes sore throat Has patient had a PCN reaction causing immediate rash, facial/tongue/throat swelling, SOB or lightheadedness with hypotension: No Has patient had a PCN reaction causing severe rash involving mucus membranes or skin necrosis: No Has patient had a PCN reaction that required hospitalization: No Has patient had a PCN reaction occurring within the last 10 years: No If all of the above answers are "NO", then may proceed with Cephalosporin use.    Medications  (Not in a hospital admission)    Vitals   Vitals:   04/26/23 0130 04/26/23 0135 04/26/23 0145 04/26/23 0150  BP: (!) 163/97 (!) 154/102 (!) 141/96 (!) 153/109  Pulse: (!) 101 95 99 97  Resp: 20 (!) 24 (!) 21 (!) 24  SpO2: 94% 93% 92% 97%  Weight:      Height:         Body mass index is 24.17 kg/m.  Physical Exam   General: Laying comfortably in bed; in no acute distress.  HENT: Normal oropharynx and mucosa. Normal external appearance of ears and nose.  Neck: Supple, no pain or tenderness  CV:  No JVD. No peripheral edema.  Pulmonary: Symmetric Chest rise. Normal respiratory effort.  Abdomen: Soft to touch, non-tender.  Ext: No cyanosis, edema, or deformity  Skin: No rash. Normal palpation of  skin.   Musculoskeletal: Normal digits and nails by inspection. No clubbing.   Neurologic Examination  Mental status/Cognition: Alert, oriented to self, place, month and year, good attention.  Speech/language: Fluent, comprehension intact, object naming intact, repetition intact.  Cranial nerves:   CN II Pupils equal and reactive to light, no VF deficits    CN III,IV,VI EOM intact, no gaze preference or deviation, no nystagmus    CN V normal sensation in V1, V2, and V3 segments bilaterally    CN VII no asymmetry, no nasolabial fold flattening    CN VIII normal hearing to speech    CN IX & X normal palatal elevation, no uvular deviation    CN XI 5/5 head turn and 5/5 shoulder shrug bilaterally    CN XII midline tongue protrusion    Motor:  Muscle bulk: normal, tone normal: Mvmt Root Nerve  Muscle Right Left Comments  SA C5/6 Ax Deltoid 3 5   EF C5/6 Mc Biceps  5   EE C6/7/8 Rad Triceps 3 5   WF C6/7 Med FCR     WE C7/8 PIN ECU     F Ab C8/T1 U ADM/FDI 4 5   HF L1/2/3 Fem Illopsoas 0 5   KE L2/3/4 Fem Quad 0 5   DF L4/5 D Peron Tib Ant 0 5   PF S1/2 Tibial Grc/Sol 0 5    Sensation:  Light touch Intact throughout   Pin prick    Temperature    Vibration   Proprioception    Coordination/Complex Motor:  - Finger to Nose intact on the left - Heel to shin unable to do - Rapid alternating movement slowed on the right compared to left. - Gait: deferred for patient safety.  Labs   CBC:  Recent Labs  Lab 04/25/23 2358 04/26/23 0003  WBC 8.1  --   NEUTROABS 3.9  --   HGB 15.0 17.0  HCT 46.4 50.0  MCV 94.7  --   PLT 186  --     Basic Metabolic Panel:  Lab Results  Component Value Date   NA 137 04/26/2023   K 5.4 (H) 04/26/2023   CO2 23 04/25/2023   GLUCOSE 116 (H)  04/26/2023   BUN 36 (H) 04/26/2023   CREATININE 1.20 04/26/2023   CALCIUM 8.0 (L) 04/25/2023   GFRNONAA >60 04/25/2023   GFRAA >60 04/27/2017   Lipid Panel:  Lab Results  Component Value Date   LDLCALC 120 (H) 04/26/2017   HgbA1c:  Lab Results  Component Value Date   HGBA1C 5.7 (H) 04/26/2017   Urine Drug Screen: No results found for: "LABOPIA", "COCAINSCRNUR", "LABBENZ", "AMPHETMU", "THCU", "LABBARB"  Alcohol Level     Component Value Date/Time   ETH <10 04/25/2023 2358    CT Head without contrast(Personally reviewed): prior L posterior frontal and R cerebellar and R posterior temporal lobe encephalomalacia consistent with chronic infarcts.   CT angio Head and Neck with contrast(Personally reviewed): CTA with R ICA 70% and L ICA 50% stenosis at the bifurcation along with right distal P3 and left P1 severe stenosis with good flow distally.   CT angio runoff: 1. 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 2. Severe narrowing of the right common iliac and superficial femoral arteries. 3. Patent three vessel runoff to the ankle bilaterally. 4. 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 5. Extensive irregular predominantly noncalcified plaque throughout the infrarenal abdominal aorta causing  up to moderate narrowing at the aortic bifurcation. 6. Acute uncomplicated sigmoid diverticulitis. Consider follow-up colonoscopy following resolution of patient's acute symptoms to exclude underlying mass. 7. Hepatic steatosis.  MRI Brain(Personally reviewed): Developing L ACA stroke  Impression   Lance Santiago is a 62 y.o. male with PMH significant for CAD, HTN, HLD, smoker 1ppd for about 47 years who presents with acute onset RLE weakness. Found to have developing L ACA stroke. Also notable was no palpable RLE pulses, cold to touch and reduced capillary refill. CTA head and neck with no  LVO, CT runoff with patent 3 vessels runoff to ankles BL along with significant peripheral artery disease. He was offered tnkase after MRI showed L ACA infarct and no noted aortic dissection on CTA runoff. He consented to tnkase and his blood pressure had to be lowered. Shortly after tnkase, he developed some mild RUE weakness. Repeat CT Head with no ICH.  Recommendations  L ACA stroke s/p tnkase: - Frequent NeuroChecks for post tNK care per stroke unit protocol: - Initial CTH demonstrated no acute hemorrhage or mass - MRI Brain - L ACA stroke - CTA - no LVO - TTE - pending - Lipid Panel: LDL - pending  - Statin: if LDL > 70 - HbA1c: pending - Antithrombotic: Start ASA 81 mg daily if 24 h CTH does not show acute hemorrhage - DVT prophylaxis: SCDs. Pharmacologic prophylaxis if 24 h CTH does not demonstrate acute hemorrhage - Smoking cessation: counseled on the importance of quitting smoking to reduce risk of stroke in the future. - Systolic Blood Pressure goal: < 180 mm Hg - Telemetry monitoring for arrhythmia: 72 hours - Swallow screen - ordered - PT/OT/SLP consults  Peripheral arterial disease: - ED team discussed CT runoff with Dr. Chestine Spore with vascular tream who recommended outpatient follow up and did not have any further imaging recommendations.  Smoker: - counseled on the importance of quitting smoking. - nicotine patch PRN.   HTN:  Non compliant with home meds. Will use cleviprex gtt to keep SBP within goal above in the setting of tnakse.  HLD: - Lipid panel pending.  This patient is critically ill and at significant risk of neurological worsening, death and care requires constant monitoring of vital signs, hemodynamics,respiratory and cardiac monitoring, neurological assessment, discussion with family, other specialists and medical decision making of high complexity. I spent 70 minutes of neurocritical care time  in the care of  this patient. This was time spent independent of  any time provided by nurse practitioner or PA.  Erick Blinks [facility] 04/26/2023  3:56 AM  Code status verified and patient is full code, allergies verified and patient reports allergic to penicillin and it gave hime sore throat. Patient would like Ms. Wendy(his girlfriend) to be the Healthcare power of attorney if he is unable to make medical decision by himself.  ______________________________________________________________________   Thank you for the opportunity to take part in the care of this patient. If you have any further questions, please contact the neurology consultation attending.  Signed,  Erick Blinks [facility] _ _ _   _ __   _ __ _ _  __ __   _ __   __ _

## 2023-04-26 NOTE — Plan of Care (Signed)
  Problem: Ischemic Stroke/TIA Tissue Perfusion: Goal: Complications of ischemic stroke/TIA will be minimized Outcome: Progressing   Problem: Health Behavior/Discharge Planning: Goal: Ability to manage health-related needs will improve Outcome: Progressing   Problem: Education: Goal: Knowledge of disease or condition will improve Outcome: Progressing Goal: Knowledge of secondary prevention will improve (MUST DOCUMENT ALL) Outcome: Progressing   Problem: Self-Care: Goal: Ability to participate in self-care as condition permits will improve Outcome: Progressing Goal: Verbalization of feelings and concerns over difficulty with self-care will improve Outcome: Progressing

## 2023-04-27 ENCOUNTER — Inpatient Hospital Stay (HOSPITAL_COMMUNITY): Payer: Medicaid Other

## 2023-04-27 DIAGNOSIS — I63522 Cerebral infarction due to unspecified occlusion or stenosis of left anterior cerebral artery: Secondary | ICD-10-CM | POA: Diagnosis not present

## 2023-04-27 LAB — BASIC METABOLIC PANEL
Anion gap: 11 (ref 5–15)
BUN: 15 mg/dL (ref 6–20)
CO2: 21 mmol/L — ABNORMAL LOW (ref 22–32)
Calcium: 8.8 mg/dL — ABNORMAL LOW (ref 8.9–10.3)
Chloride: 100 mmol/L (ref 98–111)
Creatinine, Ser: 1.08 mg/dL (ref 0.61–1.24)
GFR, Estimated: >60 mL/min (ref >60)
Glucose, Bld: 111 mg/dL — ABNORMAL HIGH (ref 70–99)
Potassium: 3.8 mmol/L (ref 3.5–5.1)
Sodium: 132 mmol/L — ABNORMAL LOW (ref 135–145)

## 2023-04-27 LAB — CBC
HCT: 49 % (ref 39.0–52.0)
Hemoglobin: 16.1 g/dL (ref 13.0–17.0)
MCH: 29.4 pg (ref 26.0–34.0)
MCHC: 32.9 g/dL (ref 30.0–36.0)
MCV: 89.4 fL (ref 80.0–100.0)
Platelets: 196 10*3/uL (ref 150–400)
RBC: 5.48 MIL/uL (ref 4.22–5.81)
RDW: 14.5 % (ref 11.5–15.5)
WBC: 13.9 10*3/uL — ABNORMAL HIGH (ref 4.0–10.5)
nRBC: 0 % (ref 0.0–0.2)

## 2023-04-27 LAB — HEMOGLOBIN A1C
Hgb A1c MFr Bld: 6 % — ABNORMAL HIGH (ref 4.8–5.6)
Mean Plasma Glucose: 126 mg/dL

## 2023-04-27 LAB — GLUCOSE, CAPILLARY
Glucose-Capillary: 114 mg/dL — ABNORMAL HIGH (ref 70–99)
Glucose-Capillary: 123 mg/dL — ABNORMAL HIGH (ref 70–99)

## 2023-04-27 MED ORDER — HYDRALAZINE HCL 25 MG PO TABS
25.0000 mg | ORAL_TABLET | Freq: Three times a day (TID) | ORAL | Status: DC | PRN
  Administered 2023-04-28: 25 mg via ORAL
  Filled 2023-04-27: qty 1

## 2023-04-27 MED ORDER — NAPROXEN 250 MG PO TABS
250.0000 mg | ORAL_TABLET | Freq: Once | ORAL | Status: DC
  Filled 2023-04-27: qty 1

## 2023-04-27 MED ORDER — CLOPIDOGREL BISULFATE 75 MG PO TABS
75.0000 mg | ORAL_TABLET | Freq: Every day | ORAL | Status: DC
  Administered 2023-04-27 – 2023-04-30 (×4): 75 mg via ORAL
  Filled 2023-04-27 (×4): qty 1

## 2023-04-27 MED ORDER — AMLODIPINE BESYLATE 10 MG PO TABS
10.0000 mg | ORAL_TABLET | Freq: Every day | ORAL | Status: DC
  Administered 2023-04-27 – 2023-04-30 (×4): 10 mg via ORAL
  Filled 2023-04-27 (×4): qty 1

## 2023-04-27 MED ORDER — ASPIRIN 81 MG PO TBEC
81.0000 mg | DELAYED_RELEASE_TABLET | Freq: Every day | ORAL | Status: DC
  Administered 2023-04-27 – 2023-04-30 (×4): 81 mg via ORAL
  Filled 2023-04-27 (×4): qty 1

## 2023-04-27 MED ORDER — ENOXAPARIN SODIUM 40 MG/0.4ML IJ SOSY
40.0000 mg | PREFILLED_SYRINGE | INTRAMUSCULAR | Status: DC
  Administered 2023-04-27 – 2023-04-30 (×4): 40 mg via SUBCUTANEOUS
  Filled 2023-04-27 (×4): qty 0.4

## 2023-04-27 MED ORDER — LOSARTAN POTASSIUM 50 MG PO TABS
50.0000 mg | ORAL_TABLET | Freq: Every day | ORAL | Status: DC
  Administered 2023-04-27 – 2023-04-28 (×2): 50 mg via ORAL
  Filled 2023-04-27 (×2): qty 1

## 2023-04-27 NOTE — Plan of Care (Signed)
  Problem: Education: Goal: Knowledge of disease or condition will improve Outcome: Progressing   Problem: Education: Goal: Knowledge of secondary prevention will improve (MUST DOCUMENT ALL) Outcome: Progressing Problem: Nutrition: Goal: Adequate nutrition will be maintained Outcome: Progressing   Problem: Elimination: Goal: Will not experience complications related to bowel motility Outcome: Progressing   Problem: Pain Managment: Goal: General experience of comfort will improve Outcome: Progressing   Problem: Skin Integrity: Goal: Risk for impaired skin integrity will decrease Outcome: Progressing    Problem: Coping: Goal: Will verbalize positive feelings about self Outcome: Progressing   Problem: Self-Care: Goal: Verbalization of feelings and concerns over difficulty with self-care will improve Outcome: Progressing

## 2023-04-27 NOTE — Progress Notes (Addendum)
  Progress Note    04/27/2023 6:48 AM Hospital Day 1  Subjective:  right arm is improved and right leg is not improved.    Afebrile   Vitals:   04/27/23 0530 04/27/23 0600  BP: (!) 150/90 (!) 169/97  Pulse: 95 97  Resp: 17 20  Temp:    SpO2: 92% 92%    Physical Exam: General:  no distress Lungs:  non labored Extremities:  hand grips equal; sensation in tact right leg but unable to wiggle toes or lift right leg off of bed.   CBC    Component Value Date/Time   WBC 8.1 04/25/2023 2358   RBC 4.90 04/25/2023 2358   HGB 17.0 04/26/2023 0003   HCT 50.0 04/26/2023 0003   PLT 186 04/25/2023 2358   MCV 94.7 04/25/2023 2358   MCH 30.6 04/25/2023 2358   MCHC 32.3 04/25/2023 2358   RDW 14.7 04/25/2023 2358   LYMPHSABS 2.7 04/25/2023 2358   MONOABS 0.9 04/25/2023 2358   EOSABS 0.5 04/25/2023 2358   BASOSABS 0.1 04/25/2023 2358    BMET    Component Value Date/Time   NA 134 (L) 04/26/2023 1145   K 4.1 04/26/2023 1145   CL 102 04/26/2023 1145   CO2 22 04/26/2023 1145   GLUCOSE 105 (H) 04/26/2023 1145   BUN 17 04/26/2023 1145   CREATININE 1.00 04/26/2023 1145   CALCIUM 9.0 04/26/2023 1145   GFRNONAA >60 04/26/2023 1145   GFRAA >60 04/27/2017 0243    INR    Component Value Date/Time   INR 1.0 04/25/2023 2358     Intake/Output Summary (Last 24 hours) at 04/27/2023 1324 Last data filed at 04/27/2023 0600 Gross per 24 hour  Intake 1721.78 ml  Output 1350 ml  Net 371.78 ml    Carotid duplex 04/26/2023: Right:  40-59% ICA stenosis Left:  1-39% ICA stenosis   Assessment/Plan:  62 y.o. male  with left ACA territory infarct; carotid artery stenosis; PAD   Hospital Day 1  Carotid stenosis with left ACA territory infarct -continues to have RLE deficit; right arm improved -carotid duplex shows 1-39% left ICA stenosis -Dr. Randie Heinz to review studies and have further discussions and recommendations -continue asa/statin  PAD -pt with RLE claudication after 2 blocks.  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   He does have a palpable right PT pulse.  He denies any rest pain.  This can be worked up as outpatient.  Smoker -discussed importance of smoking cessation with pt.    Doreatha Massed, PA-C [facility] [phone] 04/27/2023 6:48 AM  I have independently interviewed and examined patient and agree with PA assessment and plan above.  Based on duplex and CT findings does not appear that left ICA lesion is the culprit for his underlying stroke.  Recommend medical therapy.  He will follow-up in our office to evaluate claudication with known aortoiliac occlusive disease.  I have reiterated smoking cessation.  Saif Peter C. Randie Heinz, MD [facility] [place] Office: [phone] Pager: [phone]

## 2023-04-27 NOTE — Progress Notes (Addendum)
STROKE TEAM PROGRESS NOTE   BRIEF HPI Mr. Lance Santiago is a 62 y.o. male with history of CAD, HTN, HLD, tobacco use disorder (47 pack-years) who presents with acute RLE weakness. Pulses absent, cold to touch. CT Stroke showed multiple chronic infarcts. Non-compliant with home meds. CT stroke: no LVO. CTA femoral showed significant occlusion of bilateral femoral arteries L>R. MRI showed L ACA infarct. TNK given @0045  9/16.    SIGNIFICANT HOSPITAL EVENTS 9/16: Admitted to Hospital, significant bilateral femoral artery occlusion, MRI showed left ACA infarct, TNK given 12:45 AM 9/17: Significant right upper extremity improvement, PT/OT have seen  INTERIM HISTORY/SUBJECTIVE  Had just seen PT/OT before interview.  Sitting up in chair.  Alert and oriented to self and situation.  Noted back pain was a little better since moving to chair.  Not able to walk by himself due to severe continued right lower extremity weakness, 1/5.  Cannot move digits.  Sensation within normal limits.  Patient noted significant right right upper extremity improvement.  Discussed with patient stopping use of naproxen in the setting of DAPT, we will substitute with Celebrex instead.  OBJECTIVE  CBC    Component Value Date/Time   WBC 13.9 (H) 04/27/2023 0919   RBC 5.48 04/27/2023 0919   HGB 16.1 04/27/2023 0919   HCT 49.0 04/27/2023 0919   PLT 196 04/27/2023 0919   MCV 89.4 04/27/2023 0919   MCH 29.4 04/27/2023 0919   MCHC 32.9 04/27/2023 0919   RDW 14.5 04/27/2023 0919   LYMPHSABS 2.7 04/25/2023 2358   MONOABS 0.9 04/25/2023 2358   EOSABS 0.5 04/25/2023 2358   BASOSABS 0.1 04/25/2023 2358    BMET    Component Value Date/Time   NA 132 (L) 04/27/2023 0919   K 3.8 04/27/2023 0919   CL 100 04/27/2023 0919   CO2 21 (L) 04/27/2023 0919   GLUCOSE 111 (H) 04/27/2023 0919   BUN 15 04/27/2023 0919   CREATININE 1.08 04/27/2023 0919   CALCIUM 8.8 (L) 04/27/2023 0919   GFRNONAA >60 04/27/2023 0919    IMAGING  past 24 hours VAS US CAROTID  Result Date: 04/27/2023 Carotid Arterial Duplex Study Patient Name:  Lance Santiago  Date of Exam:   04/26/2023 Medical Rec #: [id number]           Accession #:    [id number] Date of Birth: 04-18-1961           Patient Gender: M Patient Age:   72 years Exam Location:  [facility] Procedure:      VAS US CAROTID Referring Phys: Emilie Rutter --------------------------------------------------------------------------------  Indications:       CVA (left ACA) and Weakness. Risk Factors:      Hypertension, hyperlipidemia, current smoker, coronary artery                    disease, PAD (claudication) . Other Factors:     CTA with R ICA 70% and L ICA 50% stenosis at the bifurcation.                    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 Dr. Randie Heinz                    consulted by stroke service. Limitations        Today's exam  was limited due to movement and the patient's                    respiratory variation. Comparison Study:  No prior study Performing Technologist: Sherren Kerns RVS  Examination Guidelines: A complete evaluation includes B-mode imaging, spectral Doppler, color Doppler, and power Doppler as needed of all accessible portions of each vessel. Bilateral testing is considered an integral part of a complete examination. Limited examinations for reoccurring indications may be performed as noted.  Right Carotid Findings: +----------+--------+--------+--------+---------------------+------------------+           PSV cm/sEDV cm/sStenosisPlaque Description   Comments           +----------+--------+--------+--------+---------------------+------------------+ CCA Prox  89      19                                   intimal thickening +----------+--------+--------+--------+---------------------+------------------+ CCA Distal71      14                                    intimal thickening +----------+--------+--------+--------+---------------------+------------------+ ICA Prox  130     47      40-59%  irregular and        Shadowing                                            calcific                                +----------+--------+--------+--------+---------------------+------------------+ ICA Mid   148     40              heterogenous         tortuous           +----------+--------+--------+--------+---------------------+------------------+ ICA Distal91      24                                   tortuous           +----------+--------+--------+--------+---------------------+------------------+ ECA       170     26              calcific                                +----------+--------+--------+--------+---------------------+------------------+ +----------+--------+-------+--------+-------------------+           PSV cm/sEDV cmsDescribeArm Pressure (mmHG) +----------+--------+-------+--------+-------------------+ [id number]                                         +----------+--------+-------+--------+-------------------+ +---------+--------+--+--------+--+ VertebralPSV cm/s46EDV cm/s12 +---------+--------+--+--------+--+  Left Carotid Findings: +----------+--------+--------+--------+------------------+------------------+           PSV cm/sEDV cm/sStenosisPlaque DescriptionComments           +----------+--------+--------+--------+------------------+------------------+ CCA Prox  100     22  intimal thickening +----------+--------+--------+--------+------------------+------------------+ CCA Distal86      20                                intimal thickening +----------+--------+--------+--------+------------------+------------------+ ICA Prox  79      30      1-39%   calcific          Shadowing           +----------+--------+--------+--------+------------------+------------------+ ICA Mid   91      28                                                   +----------+--------+--------+--------+------------------+------------------+ ICA Distal54      19                                                   +----------+--------+--------+--------+------------------+------------------+ ECA       122     17              calcific                             +----------+--------+--------+--------+------------------+------------------+ +----------+--------+--------+---------+-------------------+           PSV cm/sEDV cm/sDescribe Arm Pressure (mmHG) +----------+--------+--------+---------+-------------------+ [id number]             Turbulent                    +----------+--------+--------+---------+-------------------+ +---------+--------+--+--------+--+ VertebralPSV cm/s38EDV cm/s10 +---------+--------+--+--------+--+   Summary: Right Carotid: Velocities in the right ICA are consistent with a 40-59%                stenosis, however, higher velocities may be obscured by acoustic                shadowing. Left Carotid: Velocities in the left ICA are consistent with a 1-39% stenosis,               however, higher velocities may be obscured by acoustic sh.  *See table(s) above for measurements and observations.  Electronically signed by Waverly Ferrari MD on 04/27/2023 at 7:37:40 AM.    Final    CT HEAD WO CONTRAST ( )  Result Date: 04/27/2023 CLINICAL DATA:  Stroke, follow-up EXAM: CT HEAD WITHOUT CONTRAST TECHNIQUE: Contiguous axial images were obtained from the base of the skull through the vertex without intravenous contrast. RADIATION DOSE REDUCTION: This exam was performed according to the departmental dose-optimization program which includes automated exposure control, adjustment of the mA and/or kV according to patient size and/or use of iterative reconstruction technique.  COMPARISON:  04/26/2023 FINDINGS: Brain: Hypodensity and loss of gray-white differentiation in the medial left frontal lobe (series 3, image 25 and series 5, image 33), which correlates with the restricted diffusion seen on the 04/26/2023 MRI; this was not apparent on the 04/26/2023 head CT and is consistent with an of all thing acute infarct. Redemonstrated encephalomalacia in left frontoparietal region, right frontal lobe, and right posterior temporal lobe. Redemonstrated remote infarcts in the cerebellum. No evidence of acute hemorrhage, mass, mass effect, or midline shift. No hydrocephalus or extra-axial  collection. Vascular: No hyperdense vessel. Skull: Negative for fracture or focal lesion. Sinuses/Orbits: No acute finding. Other: The mastoids are well aerated. IMPRESSION: Hypodensity and loss of gray-white differentiation in the medial left frontal lobe, consistent with the acute infarct seen on the 04/26/2023 MRI. No acute hemorrhage. Electronically Signed   By: Wiliam Ke M.D.   On: 04/27/2023 02:39    Vitals:   04/27/23 1100 04/27/23 1118 04/27/23 1200 04/27/23 1300  BP: 136/84  (!) 89/49 135/83  Pulse: (!) 102  100 (!) 116  Resp: 16  15 (!) 21  Temp:  98.5 F (36.9 C)    TempSrc:  Oral    SpO2: 96%  94% 99%  Weight:      Height:         PHYSICAL EXAM General: Alert male sitting up in bed, no acute distress, Psych:  Mood and affect appropriate for situation CV: Regular rate and rhythm on monitor Respiratory:  Regular, unlabored respirations on room air GI: Abdomen soft and nontender   NEURO:  Mental Status: AA&Ox3, patient is able to give clear and coherent history Speech/Language: speech is without dysarthria or aphasia.  Naming, repetition, fluency, and comprehension intact.  Cranial Nerves:  II: PERRL. Visual fields full.  III, IV, VI: EOMI. Eyelids elevate symmetrically.  V: Sensation is intact to light touch and symmetrical to face.  VII: Face is symmetrical resting  and smiling VIII: hearing intact to voice. IX, X: Palate elevates symmetrically. Phonation is normal.  UX:[id number] shrug 5/5. XII: tongue is midline without fasciculations. Motor: Right upper extremity 5/5, improved, right lower extremity 1/5.  Left side normal. Tone: is normal and bulk is normal Sensation: Intact to light touch bilaterally. Extinction absent to light touch to DSS.   Coordination: FTN intact on left, right side not testable Gait: deferred   ASSESSMENT/Santiago Mr. Guzman appeared at baseline today, with profound right-sided weakness.  Patient has significant stenosis in distal right T3 and left P1 as well as a left ICA aneurysm.  Also has significant carotid disease: 70% right proximal ICA stenosis.  Will follow with vascular outpatient after discharge.  Patient's right upper extremity is much improved from yesterday.  Will need acute inpatient rehab for right lower limb weakness, which is profound.  Attempt to wean from Cardene drip today -- amlodipine 10 mg daily, losartan 50 mg with PRN hydralazine 25 mg 3 times daily on board.  Once taper completed, patient can be transferred to floor in preparation for discharge to CIR. Santiago is for aspirin/Plavix for 3 months, then aspirin monotherapy indefinitely.  Continue Crestor 40 for hyperlipidemia.  Patient should be discharged on Celebrex for back pain in place of daily naproxen, which presents additional bleeding risk.  A1c of 6.0 concerning for prediabetes.  Follow-up with PCP, neurology, vascular surgery.  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 ASPECTS 10.    CTA head & neck: No LVO. Severe stenosis: distal R P3 and L P1. 70% R  proximal ICA stenosis. 50% L proximal ICA stenosis and moderate to severe left ICA siphon stenosis.    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 Occlusion of L common iliac artery, severe narrowing of R common iliac. 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  Taper off cardene as able. Blood Pressure Goal: BP less than 180/105  Long term BP goal normotensive  Hyperlipidemia Home meds:  none. LDL 166, goal < 70  TG 573->281 Added rosuvastatin 40 Continue statin on discharge  Tobacco Abuse 40+ pack year tobacco use history Nicotine patch PRN tobacco cessation education provided Pt is willing to quit  Other Stroke Risk Factors Coronary artery disease THC abuse, cessation education provided  Other problems Hyperkalemia to 5.4: Resolved to 4.1 today Hx of back pain - tylenol and tramadol PRN - pt takes aleve at home  Hospital day # 1  Lance Iron, MD Psychiatry Resident, PGY-1  ATTENDING NOTE: I reviewed above note and agree with the assessment and Santiago. Pt was seen and examined.   No family at the bedside. Pt sitting in chair, complained of chronic LBP, taking aleve at home, but now on DAPT, would not recommend routine use of aleve at home. He has not PCP PTA, will need PCP setup for continuity of  care. Discussed with VVS Dr Randie Heinz, no left carotid intervention warranted at this time, will continue medical management. Recommend DAPT for 3 months, continue statin. Pt BP still on the high end, will add po BP meds and taper off cardene if able.   For detailed assessment and Santiago, please refer to above/below as I have made changes wherever appropriate.   Lance Plan, MD PhD Stroke Neurology 04/27/2023 4:37 PM  This patient is critically ill due to left ACA infarct s/p TNK, severe BLE PAD, hypertensive emergency and at significant risk of neurological worsening, death form recurrent stroke, hemorrhagic conversion, bleeding from TNK, leg ischemia, hypertensive encephalopathy. This patient's care requires constant monitoring of vital signs, hemodynamics, respiratory and cardiac monitoring, review of multiple databases, neurological assessment, discussion with family, other specialists and medical decision making of high complexity. I spent 35 minutes of neurocritical care time in the care of this patient.   To contact Stroke Continuity provider, please refer to [email or web address]. After hours, contact General Neurology

## 2023-04-27 NOTE — Evaluation (Signed)
Occupational Therapy Evaluation Patient Details Name: Lance Santiago MRN: [id number] DOB: [birth year] Today's Date: 04/27/2023   History of Present Illness 62 y.o. male admitted 9/16 with Rt weakness with MRI showing acute left ACA infarct s/p TNK. Pt also with iiac artery occlusion Lt>Rt. PMhx: CAD s/p stent, HLD, HTN, tobacco abuse, lumbar laminectomy, and systolic heart failure.   Clinical Impression   Lance Santiago was evaluated s/p the above admission list. He is indep, lives with his girlfriend and son at baseline. Upon evaluation the pt was limited by R weakness with LE>UE, limited insight, decreased activity tolerance, impaired coordination, and unsteady balance. Overall he needed min A for bed mobility and minA +2 for short ambulation with RW needing cues and assist to advance his RLE. Due to the deficits listed below the pt also needs up to minA for UB ADLs and max A for LB ADLs. Pt will benefit from continued acute OT services and intensive inpatient follow up therapy, >3 hours/day after discharge.         If plan is discharge home, recommend the following: A lot of help with walking and/or transfers;A lot of help with bathing/dressing/bathroom;Assistance with cooking/housework;Direct supervision/assist for medications management;Direct supervision/assist for financial management;Assist for transportation;Help with stairs or ramp for entrance    Functional Status Assessment  Patient has had a recent decline in their functional status and demonstrates the ability to make significant improvements in function in a reasonable and predictable amount of time.  Equipment Recommendations  BSC/3in1;Other (comment);Tub/shower seat (RW)    Recommendations for Other Services Rehab consult     Precautions / Restrictions Precautions Precautions: Fall;Other (comment) Precaution Comments: BP <180/105 Restrictions Weight Bearing Restrictions: No      Mobility Bed Mobility Overal bed  mobility: Needs Assistance Bed Mobility: Supine to Sit     Supine to sit: Min assist, HOB elevated     General bed mobility comments: HOB 20 degrees, use of rail, increased time momentum, UB use and hooking RLE with LLE to pivot to EOB    Transfers Overall transfer level: Needs assistance Equipment used: Rolling walker (2 wheels) Transfers: Sit to/from Stand Sit to Stand: Min assist           General transfer comment: min assist to rise from bed with cues for hand placement, RLE positioning and control and backing to surface of chair      Balance Overall balance assessment: Needs assistance Sitting-balance support: No upper extremity supported, Feet supported Sitting balance-Leahy Scale: Good     Standing balance support: Bilateral upper extremity supported Standing balance-Leahy Scale: Poor                             ADL either performed or assessed with clinical judgement   ADL Overall ADL's : Needs assistance/impaired Eating/Feeding: Set up;Sitting   Grooming: Minimal assistance;Sitting   Upper Body Bathing: Minimal assistance;Sitting   Lower Body Bathing: Maximal assistance;Sit to/from stand   Upper Body Dressing : Minimal assistance;Sitting   Lower Body Dressing: Maximal assistance;Sit to/from stand   Toilet Transfer: Minimal assistance;+2 for physical assistance;+2 for safety/equipment;Cueing for safety;Cueing for sequencing;Ambulation;BSC/3in1;Rolling walker (2 wheels)   Toileting- Clothing Manipulation and Hygiene: Minimal assistance;Sitting/lateral lean       Functional mobility during ADLs: Minimal assistance;+2 for physical assistance;+2 for safety/equipment;Rolling walker (2 wheels) General ADL Comments: limited by R weakness     Vision Baseline Vision/History: 1 Wears glasses Vision Assessment?: No apparent visual deficits Additional  Comments: quick screen, WFL     Perception Perception: Within Functional Limits       Praxis  Praxis: WFL       Pertinent Vitals/Pain Pain Assessment Pain Assessment: No/denies pain     Extremity/Trunk Assessment Upper Extremity Assessment Upper Extremity Assessment: Right hand dominant;RUE deficits/detail RUE Deficits / Details: ROM with WFL with the exception of thumb to pinky opposition. Sensation is WFL, proprioception is impaired. Slowed and deliberate coordination. Strength is grossly 4/5 RUE Sensation: decreased proprioception RUE Coordination: decreased fine motor;decreased gross motor   Lower Extremity Assessment Lower Extremity Assessment: Defer to PT evaluation RLE Deficits / Details: pt able to perform hip flexion to lift leg in supine and limited ability to lift leg in standing, no AROM in sitting for hip flexion/knee flexion/ knee extension, pt reports normal sensation   Cervical / Trunk Assessment Cervical / Trunk Assessment: Normal   Communication Communication Communication: No apparent difficulties Cueing Techniques: Verbal cues   Cognition Arousal: Alert Behavior During Therapy: WFL for tasks assessed/performed Overall Cognitive Status: Impaired/Different from baseline Area of Impairment: Safety/judgement                         Safety/Judgement: Decreased awareness of deficits, Decreased awareness of safety     General Comments: WFL for basic orientation, benefitted from cues for problem solving and sequencing     General Comments  VSS on RA, large bruise noted on R arm            Home Living Family/patient expects to be discharged to:: Private residence Living Arrangements: Spouse/significant other;Children Available Help at Discharge: Family;Available PRN/intermittently Type of Home: House Home Access: Stairs to enter [employer] of Steps: 2   Home Layout: One level     Bathroom Shower/Tub: Chief Strategy Officer: Standard     Home Equipment: None   Additional Comments: girlfriend works 3rd  shift, son works 1st shift and 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      Prior Functioning/Environment Prior Level of Function : Independent/Modified Independent;Driving             Mobility Comments: indep ADLs Comments: indep, doesnt work        OT Problem List: Decreased strength;Decreased range of motion;Impaired balance (sitting and/or standing);Decreased activity tolerance;Decreased safety awareness;Decreased knowledge of use of DME or AE;Decreased knowledge of precautions;Impaired UE functional use      OT Treatment/Interventions: Self-care/ADL training;DME and/or AE instruction;Therapeutic activities;Patient/family education;Balance training    OT Goals(Current goals can be found in the care plan section) Acute Rehab OT Goals Patient Stated Goal: to get better OT Goal Formulation: With patient Time For Goal Achievement: 05/11/23 Potential to Achieve Goals: Good ADL Goals Pt Will Perform Grooming: with modified independence Pt Will Perform Upper Body Dressing: with modified independence Pt Will Perform Lower Body Dressing: with contact guard assist;sit to/from stand Pt Will Transfer to Toilet: with contact guard assist;ambulating Pt/caregiver will Perform Home Exercise Program: Increased ROM;Increased strength;Right Upper extremity;With written HEP provided  OT Frequency: Min 1X/week    Co-evaluation PT/OT/SLP Co-Evaluation/Treatment: Yes Reason for Co-Treatment: Complexity of the patient's impairments (multi-system involvement);For patient/therapist safety PT goals addressed during session: Mobility/safety with mobility;Proper use of DME;Balance OT goals addressed during session: ADL's and self-care      AM-PAC OT "6 Clicks" Daily Activity     Outcome Measure Help from another person eating meals?: A Little Help from another person taking care of personal  grooming?: A Little Help from another person toileting, which includes using toliet, bedpan, or  urinal?: A Little Help from another person bathing (including washing, rinsing, drying)?: A Lot Help from another person to put on and taking off regular upper body clothing?: A Little Help from another person to put on and taking off regular lower body clothing?: A Lot 6 Click Score: 16   End of Session Equipment Utilized During Treatment: Rolling walker (2 wheels);Gait belt Nurse Communication: Mobility status  Activity Tolerance: Patient tolerated treatment well Patient left: in chair;with call bell/phone within reach;with chair alarm set  OT Visit Diagnosis: Other abnormalities of gait and mobility (R26.89);Unsteadiness on feet (R26.81);Muscle weakness (generalized) (M62.81);Hemiplegia and hemiparesis Hemiplegia - dominant/non-dominant: Dominant Hemiplegia - caused by: Cerebral infarction                Time: 4098-1191 OT Time Calculation (min): 23 min Charges:  OT General Charges $OT Visit: 1 Visit OT Evaluation $OT Eval Moderate Complexity: 1 Mod  Derenda Mis, OTR/L Acute Rehabilitation Services Office [phone] Secure Chat Communication Preferred   Donia Pounds 04/27/2023, 10:59 AM

## 2023-04-27 NOTE — Progress Notes (Signed)
Dr. Roda Shutters was notified of patient complaint of 3/10 headache. Patient reports he has this same type of headache frequently at home. Will treat with tylenol per Dr. Roda Shutters.

## 2023-04-27 NOTE — Progress Notes (Signed)
Inpatient Rehab Admissions Coordinator Note:   Per therapy recommendations patient was screened for CIR candidacy by Stephania Fragmin, PT. At this time, pt appears to be a potential candidate for CIR. I will place an order for rehab consult for full assessment, per our protocol.  Please contact me any with questions.Estill Dooms, PT, DPT [phone] 04/27/23 1:58 PM

## 2023-04-27 NOTE — Evaluation (Signed)
Physical Therapy Evaluation Patient Details Name: Lance Santiago MRN: [id number] DOB: Aug 12, 1960 Today's Date: 04/27/2023  History of Present Illness  62 y.o. male admitted 9/16 with Rt weakness with MRI showing acute left ACA infarct s/p TNK. Pt also with iiac artery occlusion Lt>Rt. PMhx: CAD s/p stent, HLD, HTN, tobacco abuse, lumbar laminectomy, and systolic heart failure.  Clinical Impression  Pt very pleasant able to maintain SPO2 94-96% on RA with some noted SOB. Supine BP 151/96, seated 143/92. Pt with decreased strength, coordination and function of RLE with impaired strength and coordination of RUE as well. 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 Pt motivated and able to progress to limited gait with assist to advance RLE and RW this session. Pt with impaired transfers, strength, balance and function who will benefit from acute therapy to maximize mobility and safety to decrease burden of care. Patient will benefit from intensive inpatient follow up therapy, >3 hours/day         If plan is discharge home, recommend the following: A lot of help with walking and/or transfers;A lot of help with bathing/dressing/bathroom;Assistance with cooking/housework;Assist for transportation;Help with stairs or ramp for entrance;Assistance with feeding   Can travel by private vehicle        Equipment Recommendations Rolling walker (2 wheels);BSC/3in1  Recommendations for Other Services  Rehab consult    Functional Status Assessment Patient has had a recent decline in their functional status and demonstrates the ability to make significant improvements in function in a reasonable and predictable amount of time.     Precautions / Restrictions Precautions Precautions: Fall;Other (comment) Precaution Comments: BP <180/105      Mobility  Bed Mobility Overal bed mobility: Needs Assistance Bed Mobility: Supine to Sit     Supine to sit: Min assist, HOB  elevated     General bed mobility comments: HOB 20 degrees, use of rail, increased time momentum, UB use and hooking RLE with LLE to pivot to EOB    Transfers Overall transfer level: Needs assistance   Transfers: Sit to/from Stand Sit to Stand: Min assist           General transfer comment: min assist to rise from bed with cues for hand placement, RLE positioning and control and backing to surface of chair    Ambulation/Gait Ambulation/Gait assistance: Min assist, +2 safety/equipment Gait Distance (Feet): 15 Feet Assistive device: Rolling walker (2 wheels) Gait Pattern/deviations: Step-to pattern   Gait velocity interpretation: <1.31 ft/sec, indicative of household ambulator   General Gait Details: pt with assist to advance RLE and direct/control RW with stepping. pt able to walk 15' with reliance on RW and physical assist +1 for lines  Stairs            Wheelchair Mobility     Tilt Bed    Modified Rankin (Stroke Patients Only) Modified Rankin (Stroke Patients Only) Pre-Morbid Rankin Score: No symptoms Modified Rankin: Moderately severe disability     Balance Overall balance assessment: Needs assistance Sitting-balance support: No upper extremity supported, Feet supported Sitting balance-Leahy Scale: Good     Standing balance support: Bilateral upper extremity supported Standing balance-Leahy Scale: Poor                               Pertinent Vitals/Pain Pain Assessment Pain Assessment: No/denies pain    Home Living Family/patient expects to be discharged to:: Private residence Living Arrangements: Spouse/significant other;Children  Available Help at Discharge: Family;Available PRN/intermittently Type of Home: House Home Access: Stairs to enter   Entrance Stairs-Number of Steps: 2   Home Layout: One level Home Equipment: None Additional Comments: girlfriend works 3rd shift, son works 1st shift and 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    Prior Function Prior Level of Function : Independent/Modified Independent;Driving                     Extremity/Trunk Assessment   Upper Extremity Assessment Upper Extremity Assessment: Defer to OT evaluation    Lower Extremity Assessment Lower Extremity Assessment: RLE deficits/detail RLE Deficits / Details: pt able to perform hip flexion to lift leg in supine and limited ability to lift leg in standing, no AROM in sitting for hip flexion/knee flexion/ knee extension, pt reports normal sensation    Cervical / Trunk Assessment Cervical / Trunk Assessment: Normal  Communication   Communication Communication: No apparent difficulties Cueing Techniques: Verbal cues  Cognition Arousal: Alert Behavior During Therapy: WFL for tasks assessed/performed Overall Cognitive Status: Impaired/Different from baseline Area of Impairment: Safety/judgement                         Safety/Judgement: Decreased awareness of deficits, Decreased awareness of safety              General Comments      Exercises     Assessment/Plan    PT Assessment Patient needs continued PT services  PT Problem List Decreased strength;Decreased mobility;Decreased coordination;Decreased activity tolerance;Decreased cognition;Decreased balance;Decreased knowledge of use of DME;Decreased safety awareness       PT Treatment Interventions DME instruction;Gait training;Stair training;Therapeutic activities;Functional mobility training;Patient/family education;Neuromuscular re-education;Balance training;Therapeutic exercise    PT Goals (Current goals can be found in the Care Plan section)  Acute Rehab PT Goals Patient Stated Goal: return home to  my cat PT Goal Formulation: With patient Time For Goal Achievement: 05/11/23 Potential to Achieve Goals: Good    Frequency Min 1X/week     Co-evaluation PT/OT/SLP Co-Evaluation/Treatment: Yes Reason for Co-Treatment:  Complexity of the patient's impairments (multi-system involvement);For patient/therapist safety PT goals addressed during session: Mobility/safety with mobility;Proper use of DME;Balance         AM-PAC PT "6 Clicks" Mobility  Outcome Measure Help needed turning from your back to your side while in a flat bed without using bedrails?: A Little Help needed moving from lying on your back to sitting on the side of a flat bed without using bedrails?: A Little Help needed moving to and from a bed to a chair (including a wheelchair)?: A Little Help needed standing up from a chair using your arms (e.g., wheelchair or bedside chair)?: A Little Help needed to walk in hospital room?: A Lot Help needed climbing 3-5 steps with a railing? : Total 6 Click Score: 15    End of Session Equipment Utilized During Treatment: Gait belt Activity Tolerance: Patient tolerated treatment well Patient left: in chair;with call bell/phone within reach;with chair alarm set;with nursing/sitter in room Nurse Communication: Mobility status;Precautions PT Visit Diagnosis: Other abnormalities of gait and mobility (R26.89);Muscle weakness (generalized) (M62.81);Unsteadiness on feet (R26.81)    Time: 1610-9604 PT Time Calculation (min) (ACUTE ONLY): 20 min   Charges:   PT Evaluation $PT Eval Moderate Complexity: 1 Mod   PT General Charges $$ ACUTE PT VISIT: 1 Visit         Merryl Hacker, PT Acute Rehabilitation Services Office: [phone]  Payden Docter B Sharece Fleischhacker 04/27/2023, 9:17 AM

## 2023-04-27 NOTE — Evaluation (Signed)
Speech Language Pathology Evaluation Patient Details Name: Lance Santiago MRN: [id number] DOB: 06/06/1961 Today's Date: 04/27/2023 Time: 8295-6213 SLP Time Calculation (min) (ACUTE ONLY): 15 min  Problem List:  Patient Active Problem List   Diagnosis Date Noted   Arterial ischemic stroke, ACA (anterior cerebral artery), left, acute (HCC) 04/26/2023   CAD S/P percutaneous coronary angioplasty 05/10/2017   PVD (peripheral vascular disease) with claudication (HCC) 05/10/2017   Hypertension 04/28/2017   Dyslipidemia 04/28/2017   Acute systolic heart failure (HCC) 04/28/2017   Tobacco abuse 04/26/2017   Cardiogenic shock (HCC) 04/26/2017   Acute ST elevation myocardial infarction (STEMI) of inferior wall (HCC) 04/25/2017   Acute ST elevation myocardial infarction (STEMI) involving right coronary artery (HCC) 04/25/2017   Complete heart block (HCC)    Past Medical History:  Past Medical History:  Diagnosis Date   Coronary artery disease    Hyperlipidemia    Hypertension    Lumbar disc disease    Myocardial infarction (HCC)    9/18 PCI/DESx1 to RCA, EF 45%   Smoking    Systolic heart failure (HCC)    Past Surgical History:  Past Surgical History:  Procedure Laterality Date   CARDIAC CATHETERIZATION     CORONARY/GRAFT ACUTE MI REVASCULARIZATION N/A 04/25/2017   Procedure: Coronary/Graft Acute MI Revascularization;  Surgeon: Runell Gess, MD;  Location: [facility];  Service: Cardiovascular;  Laterality: N/A;   KNEE SURGERY     LEFT HEART CATH AND CORONARY ANGIOGRAPHY N/A 04/25/2017   Procedure: LEFT HEART CATH AND CORONARY ANGIOGRAPHY;  Surgeon: Runell Gess, MD;  Location: [facility];  Service: Cardiovascular;  Laterality: N/A;   LUMBAR LAMINECTOMY     HPI:  Pt is a 62 y.o. male who presented on 04/26/23 with acute RLE weakness. CT Stroke revealed multiple chronic infarcts. PMH significant for CAD, HTN, HLD, and tobacco abuse.   Assessment / Plan /  Recommendation Clinical Impression  Pt seen by SLP for cognitive-linguistic evaluation post stroke. He was awake, alert, and pleasant throughout the evaluation. He lives with his girlfriend and son, both of whom he drives to work each day. Pt presented with mild deficits in memory and attention, as evidenced by administration of the [employer] Mental Examination (SLUMS). Score (21) yielded evidence for a mild neurocognitive impairment. Per previous notes from OT, impairments in safety, problem solving, and awareness may be present. Pt stated that he has had memory and attention deficits before this hospitalization. He stated that he does not notice any changes in his cognitive function post stroke. Throughout the evaluation, he was distractable and moderately tangential. Divergent naming was mildly impaired, but function may have been impacted by pt's preexisiting attention deficits. When completing the clock drawing task, he did not appropriately place clock hands on the clock face and did not display the correct time. Recommendation of continued ST followup to further evaluate memory, awareness, and problem solving.   SLP Assessment  SLP Recommendation/Assessment: Patient needs continued Speech Lanaguage Pathology Services    Recommendations for follow up therapy are one component of a multi-disciplinary discharge planning process, led by the attending physician.  Recommendations may be updated based on patient status, additional functional criteria and insurance authorization.    Follow Up Recommendations  Other (comment) (TBD)    Assistance Recommended at Discharge  Set up Supervision/Assistance  Functional Status Assessment Patient has had a recent decline in their functional status and demonstrates the ability to make significant improvements in function in  a reasonable and predictable amount of time.  Frequency and Duration min 1 x/week  1 week      SLP Evaluation Cognition   Overall Cognitive Status: Impaired/Different from baseline Arousal/Alertness: Awake/alert Orientation Level: Oriented X4 Attention: Focused Focused Attention: Impaired Focused Attention Impairment: Verbal complex;Functional complex Memory: Impaired Memory Impairment: Decreased short term memory;Decreased recall of new information;Retrieval deficit Decreased Short Term Memory: Verbal complex;Functional complex Awareness: Appears intact       Comprehension  Auditory Comprehension Overall Auditory Comprehension: Appears within functional limits for tasks assessed Yes/No Questions: Within Functional Limits Commands: Within Functional Limits Conversation: Complex Interfering Components: Attention Visual Recognition/Discrimination Discrimination: Not tested Reading Comprehension Reading Status: Not tested    Expression Expression Primary Mode of Expression: Verbal Verbal Expression Overall Verbal Expression: Appears within functional limits for tasks assessed Initiation: No impairment Automatic Speech: Name Level of Generative/Spontaneous Verbalization: Conversation Repetition: No impairment Naming: No impairment Responsive: Not tested Confrontation: Not tested Convergent: Not tested Pragmatics: No impairment Interfering Components: Attention Non-Verbal Means of Communication: Not applicable   Oral / Motor  Oral Motor/Sensory Function Overall Oral Motor/Sensory Function: Within functional limits Motor Speech Overall Motor Speech: Appears within functional limits for tasks assessed Respiration: Within functional limits Phonation: Normal Resonance: Within functional limits Articulation: Within functional limitis Intelligibility: Intelligible Motor Planning: Witnin functional limits Motor Speech Errors: Not applicable            Marline Backbone, Senaida Lange., Speech Therapy Student  04/27/2023, 4:56 PM

## 2023-04-28 DIAGNOSIS — I63522 Cerebral infarction due to unspecified occlusion or stenosis of left anterior cerebral artery: Secondary | ICD-10-CM | POA: Diagnosis not present

## 2023-04-28 LAB — CBC
HCT: 49.8 % (ref 39.0–52.0)
Hemoglobin: 16.1 g/dL (ref 13.0–17.0)
MCH: 29.2 pg (ref 26.0–34.0)
MCHC: 32.3 g/dL (ref 30.0–36.0)
MCV: 90.2 fL (ref 80.0–100.0)
Platelets: 182 10*3/uL (ref 150–400)
RBC: 5.52 MIL/uL (ref 4.22–5.81)
RDW: 14.7 % (ref 11.5–15.5)
WBC: 10.8 10*3/uL — ABNORMAL HIGH (ref 4.0–10.5)
nRBC: 0 % (ref 0.0–0.2)

## 2023-04-28 LAB — BASIC METABOLIC PANEL WITH GFR
Anion gap: 10 (ref 5–15)
BUN: 16 mg/dL (ref 6–20)
CO2: 25 mmol/L (ref 22–32)
Calcium: 9.2 mg/dL (ref 8.9–10.3)
Chloride: 98 mmol/L (ref 98–111)
Creatinine, Ser: 1.11 mg/dL (ref 0.61–1.24)
GFR, Estimated: >60 mL/min (ref >60)
Glucose, Bld: 125 mg/dL — ABNORMAL HIGH (ref 70–99)
Potassium: 4.3 mmol/L (ref 3.5–5.1)
Sodium: 133 mmol/L — ABNORMAL LOW (ref 135–145)

## 2023-04-28 MED ORDER — HYDRALAZINE HCL 20 MG/ML IJ SOLN
10.0000 mg | INTRAMUSCULAR | Status: DC | PRN
  Administered 2023-04-28 – 2023-04-29 (×2): 10 mg via INTRAVENOUS
  Filled 2023-04-28 (×2): qty 1

## 2023-04-28 MED ORDER — METOPROLOL TARTRATE 25 MG PO TABS
25.0000 mg | ORAL_TABLET | Freq: Two times a day (BID) | ORAL | Status: DC
  Administered 2023-04-28 – 2023-04-30 (×4): 25 mg via ORAL
  Filled 2023-04-28 (×4): qty 1

## 2023-04-28 MED ORDER — LOSARTAN POTASSIUM 50 MG PO TABS
100.0000 mg | ORAL_TABLET | Freq: Every day | ORAL | Status: DC
  Administered 2023-04-29 – 2023-04-30 (×2): 100 mg via ORAL
  Filled 2023-04-28 (×2): qty 2

## 2023-04-28 MED ORDER — LOSARTAN POTASSIUM 50 MG PO TABS
50.0000 mg | ORAL_TABLET | Freq: Once | ORAL | Status: AC
  Administered 2023-04-28: 50 mg via ORAL
  Filled 2023-04-28: qty 1

## 2023-04-28 MED ORDER — LABETALOL HCL 5 MG/ML IV SOLN: 10.0000 mg | INTRAVENOUS | Status: DC | PRN

## 2023-04-28 NOTE — Progress Notes (Addendum)
Physical Therapy Treatment Patient Details Name: Lance Santiago MRN: [id number] DOB: 1961-03-31 Today's Date: 04/28/2023   History of Present Illness 62 y.o. male admitted 9/16 with Rt weakness with MRI showing acute left ACA infarct s/p TNK. Pt also with iiac artery occlusion Lt>Rt. PMhx: CAD s/p stent, HLD, HTN, tobacco abuse, lumbar laminectomy, and systolic heart failure.    PT Comments  Pt greeted up in chair and eager for mobility, however pt with limited progress this session due to elevated BP. Pt BP goal <180/105 with pt BP rising to 182/109 with short gait in room, RN aware. Pt continues to require grossly min A to power up to stand with cues for hand placement and min A for gait with RW support with hands on assist to advance RLE each step as pt with poor/zero motor control. Current plan remains appropriate to address deficits and maximize functional independence and decrease caregiver burden. Pt continues to benefit from skilled PT services to progress toward functional mobility goals.     If plan is discharge home, recommend the following: A lot of help with walking and/or transfers;A lot of help with bathing/dressing/bathroom;Assistance with cooking/housework;Assist for transportation;Help with stairs or ramp for entrance;Assistance with feeding   Can travel by private vehicle        Equipment Recommendations  Rolling walker (2 wheels);BSC/3in1    Recommendations for Other Services       Precautions / Restrictions Precautions Precautions: Fall;Other (comment) Precaution Comments: BP <180/105 Restrictions Weight Bearing Restrictions: No     Mobility  Bed Mobility Overal bed mobility: Needs Assistance             General bed mobility comments: up in chair on arrival    Transfers Overall transfer level: Needs assistance Equipment used: Rolling walker (2 wheels) Transfers: Sit to/from Stand, Bed to chair/wheelchair/BSC Sit to Stand: Min assist Stand pivot  transfers: Contact guard assist         General transfer comment: min assist to rise from chair and standard chair    Ambulation/Gait Ambulation/Gait assistance: Min assist Gait Distance (Feet): 12 Feet Assistive device: Rolling walker (2 wheels) Gait Pattern/deviations: Step-to pattern       General Gait Details: pt with assist to advance RLE and direct/control RW with stepping. short gait in room with ditance limited by elevated BP up to 182/109   Stairs             Wheelchair Mobility     Tilt Bed    Modified Rankin (Stroke Patients Only) Modified Rankin (Stroke Patients Only) Pre-Morbid Rankin Score: No symptoms Modified Rankin: Moderately severe disability     Balance Overall balance assessment: Needs assistance Sitting-balance support: No upper extremity supported, Feet supported Sitting balance-Leahy Scale: Good     Standing balance support: Bilateral upper extremity supported Standing balance-Leahy Scale: Poor                              Cognition Arousal: Alert Behavior During Therapy: WFL for tasks assessed/performed Overall Cognitive Status: Impaired/Different from baseline Area of Impairment: Safety/judgement                         Safety/Judgement: Decreased awareness of deficits, Decreased awareness of safety     General Comments: WFL for basic orientation, benefitted from cues for problem solving and sequencing        Exercises      General  Comments General comments (skin integrity, edema, etc.): BP 167/1087 prior to session, 176/110 in standing, 182/109 post short gait in room, BP goal <180/105, further exercise and mobility deferred      Pertinent Vitals/Pain Pain Assessment Pain Assessment: No/denies pain    Home Living                          Prior Function            PT Goals (current goals can now be found in the care plan section) Acute Rehab PT Goals Patient Stated Goal: return  home to  my cat PT Goal Formulation: With patient Time For Goal Achievement: 05/11/23 Progress towards PT goals: Progressing toward goals    Frequency    Min 1X/week      PT Plan      Co-evaluation              AM-PAC PT "6 Clicks" Mobility   Outcome Measure  Help needed turning from your back to your side while in a flat bed without using bedrails?: A Little Help needed moving from lying on your back to sitting on the side of a flat bed without using bedrails?: A Little Help needed moving to and from a bed to a chair (including a wheelchair)?: A Little Help needed standing up from a chair using your arms (e.g., wheelchair or bedside chair)?: A Little Help needed to walk in hospital room?: A Lot Help needed climbing 3-5 steps with a railing? : Total 6 Click Score: 15    End of Session Equipment Utilized During Treatment: Gait belt Activity Tolerance: Patient tolerated treatment well Patient left: in chair;with call bell/phone within reach;with chair alarm set Nurse Communication: Mobility status;Other (comment) (BP) PT Visit Diagnosis: Other abnormalities of gait and mobility (R26.89);Muscle weakness (generalized) (M62.81);Unsteadiness on feet (R26.81)     Time: 1315-1340 PT Time Calculation (min) (ACUTE ONLY): 25 min  Charges:    $Gait Training: 8-22 mins $Therapeutic Activity: 8-22 mins PT General Charges $$ ACUTE PT VISIT: 1 Visit                     Lively Haberman R. PTA Acute Rehabilitation Services Office: [phone]   Catalina Antigua 04/28/2023, 3:38 PM

## 2023-04-28 NOTE — Plan of Care (Signed)
Problem: Education: Goal: Knowledge of disease or condition will improve Outcome: Progressing Goal: Knowledge of secondary prevention will improve (MUST DOCUMENT ALL) Outcome: Progressing Goal: Knowledge of patient specific risk factors will improve Loraine Leriche N/A or DELETE if not current risk factor) Outcome: Progressing   Problem: Ischemic Stroke/TIA Tissue Perfusion: Goal: Complications of ischemic stroke/TIA will be minimized Outcome: Progressing   Problem: Coping: Goal: Will verbalize positive feelings about self Outcome: Progressing Goal: Will identify appropriate support needs Outcome: Progressing

## 2023-04-28 NOTE — Progress Notes (Addendum)
STROKE TEAM PROGRESS NOTE   BRIEF HPI Mr. Lance Santiago is a 62 y.o. male with history of CAD, HTN, HLD, tobacco use disorder (47 pack-years) who presents with acute RLE weakness. Pulses absent, cold to touch. CT Stroke showed multiple chronic infarcts. Non-compliant with home meds. CT stroke: no LVO. CTA femoral showed significant occlusion of bilateral femoral arteries L>R. MRI showed L ACA infarct. TNK given @0045  9/16.    SIGNIFICANT HOSPITAL EVENTS 9/16: Admitted to Hospital, significant bilateral femoral artery occlusion, MRI showed left ACA infarct, TNK given 12:45 AM 9/17: Significant right upper extremity improvement, PT/OT have seen  INTERIM HISTORY/SUBJECTIVE  Patient is seen in his room, states that he is not sleeping well in the ICU.  Continues to have very little movement of right lower extremity but states sensation has returned.  He states that use of his right upper extremity has improved.  He is pending discharge to CIR, will transfer out of ICU today.  OBJECTIVE  CBC    Component Value Date/Time   WBC 10.8 (H) 04/28/2023 0339   RBC 5.52 04/28/2023 0339   HGB 16.1 04/28/2023 0339   HCT 49.8 04/28/2023 0339   PLT 182 04/28/2023 0339   MCV 90.2 04/28/2023 0339   MCH 29.2 04/28/2023 0339   MCHC 32.3 04/28/2023 0339   RDW 14.7 04/28/2023 0339   LYMPHSABS 2.7 04/25/2023 2358   MONOABS 0.9 04/25/2023 2358   EOSABS 0.5 04/25/2023 2358   BASOSABS 0.1 04/25/2023 2358    BMET    Component Value Date/Time   NA 133 (L) 04/28/2023 0339   K 4.3 04/28/2023 0339   CL 98 04/28/2023 0339   CO2 25 04/28/2023 0339   GLUCOSE 125 (H) 04/28/2023 0339   BUN 16 04/28/2023 0339   CREATININE 1.11 04/28/2023 0339   CALCIUM 9.2 04/28/2023 0339   GFRNONAA >60 04/28/2023 0339    IMAGING past 24 hours No results found.  Vitals:   04/28/23 0500 04/28/23 0600 04/28/23 0700 04/28/23 0805  BP: (!) 172/96 (!) 159/96 (!) 164/98   Pulse: 93 95 97   Resp: 15 (!) 21 17   Temp:     99.2 F (37.3 C)  TempSrc:    Oral  SpO2: 97% 100% 98%   Weight:      Height:         PHYSICAL EXAM General: Alert male sitting up in bed, no acute distress, Psych:  Mood and affect appropriate for situation CV: Regular rate and rhythm on monitor Respiratory:  Regular, unlabored respirations on room air GI: Abdomen soft and nontender   NEURO:  Mental Status: AA&Ox3, patient is able to give clear and coherent history Speech/Language: speech is without dysarthria or aphasia.    Cranial Nerves:  II: PERRL. Visual fields full.  III, IV, VI: EOMI. Eyelids elevate symmetrically.  V: Sensation is intact to light touch and symmetrical to face.  VII: Face is symmetrical resting and smiling VIII: hearing intact to voice. IX, X: Palate elevates symmetrically. Phonation is normal.  KG:[id number] shrug 5/5. XII: tongue is midline without fasciculations. Motor: Right upper extremity 5/5, improved, right lower extremity 1/5.  Left side normal. Tone: is normal and bulk is normal Sensation: Intact to light touch bilaterally.   Coordination: FTN intact bilaterally Gait: deferred   ASSESSMENT/PLAN Mr. Lance Santiago appeared at baseline today, with profound right-sided weakness.  Patient has significant stenosis in distal right T3 and left P1 as well as a left ICA aneurysm.  Also has significant carotid  disease: 70% right proximal ICA stenosis.  Will follow with vascular outpatient after discharge.  Patient's right upper extremity is much improved from yesterday.  Will need acute inpatient rehab for right lower limb weakness, which is profound.  Attempt to wean from Cardene drip today -- amlodipine 10 mg daily, losartan 50 mg with PRN hydralazine 25 mg 3 times daily on board.  Once taper completed, patient can be transferred to floor in preparation for discharge to CIR. Plan is for aspirin/Plavix for 3 months, then aspirin monotherapy indefinitely.  Continue Crestor 40 for hyperlipidemia.  Patient  should be discharged on Celebrex for back pain in place of daily naproxen, which presents additional bleeding risk.  A1c of 6.0 concerning for prediabetes.  Follow-up with PCP, neurology, vascular surgery.  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 ASPECTS 10.    CTA head & neck: No LVO. Severe stenosis: distal R P3 and L P1. 70% R proximal ICA stenosis. 50% L proximal ICA stenosis and moderate to severe left ICA siphon stenosis.    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 Occlusion of L common iliac artery, severe narrowing of R common iliac. 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  Add metoprolol 25 bid off cardene now Long term BP goal normotensive  Hyperlipidemia Home meds:  none. LDL 166, goal < 70  TG 573->281 Added rosuvastatin 40 Continue statin on discharge  Tobacco Abuse 40+ pack year tobacco use history Nicotine patch PRN tobacco cessation education provided Pt is willing to quit  Other Stroke Risk  Factors Coronary artery disease THC abuse, cessation education provided  Other problems Hyperkalemia to 5.4: Resolved to 4.1 today Hx of back pain - tylenol and tramadol PRN - pt takes aleve at home  Hospital day # 2  Patient is seen by NP and then by MD, MD to edit note is needed  Lance Santiago , MSN, AGACNP-BC [facility] See Amion for schedule and pager information 04/28/2023 11:59 AM  ATTENDING NOTE: I reviewed above note and agree with the assessment and plan. Pt was seen and examined.   CIR coordinator at the bedside. Pt sitting in chair, AAO x 3, still has right leg weakness. Back pain at baseline level, pending CIR. On DAPT and statin. BP still high, but off cardene, increased losartan to 100 continue amlodipine and will add metoprolol too. Need gradually normalized BP in 2-3 days. Pending CIR.  For detailed assessment and plan, please refer to above/below as I have made changes wherever appropriate.   Marvel Plan, MD PhD Stroke Neurology 04/28/2023 9:10 PM     To contact Stroke  Continuity provider, please refer to [email or web address]. After hours, contact General Neurology

## 2023-04-28 NOTE — Plan of Care (Signed)
  Problem: Education: Goal: Knowledge of disease or condition will improve Outcome: Progressing   Problem: Coping: Goal: Will verbalize positive feelings about self Outcome: Progressing   Problem: Nutrition: Goal: Risk of aspiration will decrease Outcome: Progressing   Problem: Nutrition: Goal: Dietary intake will improve Outcome: Progressing   Problem: Safety: Goal: Ability to remain free from injury will improve Outcome: Progressing

## 2023-04-28 NOTE — Discharge Summary (Addendum)
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 in the setting of severe, untreated atherosclerotic disease, uncontrolled risk factors and chronic tobacco use    Secondary Diagnoses: Hypertension Hyperlipidemia Tobacco abuse Severe PAD THC abuse CAD Chronic back pain  Allergies as of 04/30/2023       Reactions   Penicillins Other (See Comments)   Causes sore throat Has patient had a PCN reaction causing immediate rash, facial/tongue/throat swelling, SOB or lightheadedness with hypotension: No Has patient had a PCN reaction causing severe rash involving mucus membranes or skin necrosis: No Has patient had a PCN reaction that required hospitalization: No Has patient had a PCN reaction occurring within the last 10 years: No If all of the above answers are "NO", then may proceed with Cephalosporin use.        Medication List     STOP taking these medications    esomeprazole 10 MG packet Commonly known as: NEXIUM   naproxen sodium 220 MG tablet Commonly known as: ALEVE       TAKE these medications    acetaminophen 325 MG tablet Commonly known as: TYLENOL Take 2 tablets (650 mg total) by mouth every 6 (six) hours as needed for mild pain or moderate pain (or temp > 37.5 C (99.5 F)).   amLODipine 10 MG tablet Commonly known as: NORVASC Take 1 tablet (10 mg total) by mouth daily. Start taking on: May 01, 2023   aspirin EC 81 MG tablet Take 1 tablet (81 mg total) by mouth daily. Swallow whole. Start taking on: May 01, 2023   celecoxib 100 MG capsule Commonly known as:  CeleBREX Take 1 capsule (100 mg total) by mouth 2 (two) times daily as needed for moderate pain or mild pain.   clopidogrel 75 MG tablet Commonly known as: PLAVIX Take 1 tablet (75 mg total) by mouth daily. Start taking on: May 01, 2023   lidocaine 5 % Commonly known as: LIDODERM Place 1 patch onto the skin daily. Remove & Discard patch within 12 hours or as directed by MD Start taking on: May 01, 2023   losartan 100 MG tablet Commonly known as: COZAAR Take 1 tablet (100 mg total) by mouth daily. Start taking on: May 01, 2023   metoprolol tartrate 25 MG tablet Commonly known as: LOPRESSOR Take 1 tablet (25 mg total) by mouth 2 (two) times daily.   nicotine 21 mg/24hr patch Commonly known as: NICODERM CQ - dosed in mg/24 hours Place 1 patch (21 mg total) onto the skin daily as needed (reduce craving for cigarettes).   rosuvastatin 40 MG tablet Commonly known as: CRESTOR Take 1 tablet (40 mg total) by mouth daily. Start taking on: May 01, 2023        LABORATORY STUDIES CBC    Component Value Date/Time   WBC 9.1 04/30/2023 0618   RBC 5.70 04/30/2023 0618   HGB 16.7 04/30/2023 0618   HCT 51.3 04/30/2023 0618   PLT 204 04/30/2023 0618   MCV 90.0 04/30/2023 0618   MCH 29.3 04/30/2023 0618   MCHC 32.6 04/30/2023  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 in the setting of severe, untreated atherosclerotic disease, uncontrolled risk factors and chronic tobacco use    Secondary Diagnoses: Hypertension Hyperlipidemia Tobacco abuse Severe PAD THC abuse CAD Chronic back pain  Allergies as of 04/30/2023       Reactions   Penicillins Other (See Comments)   Causes sore throat Has patient had a PCN reaction causing immediate rash, facial/tongue/throat swelling, SOB or lightheadedness with hypotension: No Has patient had a PCN reaction causing severe rash involving mucus membranes or skin necrosis: No Has patient had a PCN reaction that required hospitalization: No Has patient had a PCN reaction occurring within the last 10 years: No If all of the above answers are "NO", then may proceed with Cephalosporin use.        Medication List     STOP taking these medications    esomeprazole 10 MG packet Commonly known as: NEXIUM   naproxen sodium 220 MG tablet Commonly known as: ALEVE       TAKE these medications    acetaminophen 325 MG tablet Commonly known as: TYLENOL Take 2 tablets (650 mg total) by mouth every 6 (six) hours as needed for mild pain or moderate pain (or temp > 37.5 C (99.5 F)).   amLODipine 10 MG tablet Commonly known as: NORVASC Take 1 tablet (10 mg total) by mouth daily. Start taking on: May 01, 2023   aspirin EC 81 MG tablet Take 1 tablet (81 mg total) by mouth daily. Swallow whole. Start taking on: May 01, 2023   celecoxib 100 MG capsule Commonly known as:  CeleBREX Take 1 capsule (100 mg total) by mouth 2 (two) times daily as needed for moderate pain or mild pain.   clopidogrel 75 MG tablet Commonly known as: PLAVIX Take 1 tablet (75 mg total) by mouth daily. Start taking on: May 01, 2023   lidocaine 5 % Commonly known as: LIDODERM Place 1 patch onto the skin daily. Remove & Discard patch within 12 hours or as directed by MD Start taking on: May 01, 2023   losartan 100 MG tablet Commonly known as: COZAAR Take 1 tablet (100 mg total) by mouth daily. Start taking on: May 01, 2023   metoprolol tartrate 25 MG tablet Commonly known as: LOPRESSOR Take 1 tablet (25 mg total) by mouth 2 (two) times daily.   nicotine 21 mg/24hr patch Commonly known as: NICODERM CQ - dosed in mg/24 hours Place 1 patch (21 mg total) onto the skin daily as needed (reduce craving for cigarettes).   rosuvastatin 40 MG tablet Commonly known as: CRESTOR Take 1 tablet (40 mg total) by mouth daily. Start taking on: May 01, 2023        LABORATORY STUDIES CBC    Component Value Date/Time   WBC 9.1 04/30/2023 0618   RBC 5.70 04/30/2023 0618   HGB 16.7 04/30/2023 0618   HCT 51.3 04/30/2023 0618   PLT 204 04/30/2023 0618   MCV 90.0 04/30/2023 0618   MCH 29.3 04/30/2023 0618   MCHC 32.6 04/30/2023  0618   RDW 14.5 04/30/2023 0618   LYMPHSABS 2.7 04/25/2023 2358   MONOABS 0.9 04/25/2023 2358   EOSABS 0.5 04/25/2023 2358   BASOSABS 0.1 04/25/2023 2358   CMP    Component Value Date/Time   NA 136 04/30/2023 0618   K 4.5 04/30/2023 0618   CL 100 04/30/2023 0618   CO2 26 04/30/2023 0618   GLUCOSE 91 04/30/2023 0618   BUN 22 (H) 04/30/2023 0618   CREATININE 1.21 04/30/2023 0618   CALCIUM 9.5 04/30/2023 0618   PROT 5.8 (L) 04/25/2023 2358   ALBUMIN 3.2 (L) 04/25/2023 2358   AST 29 04/25/2023 2358   ALT 20 04/25/2023 2358   ALKPHOS 59 04/25/2023 2358   BILITOT 0.5 04/25/2023 2358   GFRNONAA >60 04/30/2023 0618   GFRAA >60  04/27/2017 0243   COAGS Lab Results  Component Value Date   INR 1.0 04/25/2023   Lipid Panel    Component Value Date/Time   CHOL 261 (H) 04/26/2023 0340   TRIG 281 (H) 04/26/2023 1145   HDL 34 (L) 04/26/2023 0340   CHOLHDL 7.7 04/26/2023 0340   VLDL UNABLE TO CALCULATE IF TRIGLYCERIDE OVER 400 mg/dL 32/44/0102 7253   LDLCALC UNABLE TO CALCULATE IF TRIGLYCERIDE OVER 400 mg/dL 66/44/0347 4259   DGLO7F  Lab Results  Component Value Date   HGBA1C 6.0 (H) 04/26/2023   Urine Drug Screen positive for THC Alcohol Level    Component Value Date/Time   ETH <10 04/25/2023 2358     SIGNIFICANT DIAGNOSTIC STUDIES VAS US CAROTID  Result Date: 04/27/2023 Carotid Arterial Duplex Study Patient Name:  Lance Santiago  Date of Exam:   04/26/2023 Medical Rec #: [id number]           Accession #:    [id number] Date of Birth: 1960-12-02           Patient Gender: M Patient Age:   89 years Exam Location:  [facility] Procedure:      VAS US CAROTID Referring Phys: Emilie Rutter --------------------------------------------------------------------------------  Indications:       CVA (left ACA) and Weakness. Risk Factors:      Hypertension, hyperlipidemia, current smoker, coronary artery                    disease, PAD (claudication) . Other Factors:     CTA with R ICA 70% and L ICA 50% stenosis at the bifurcation.                    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 Dr. Randie Heinz                    consulted by stroke service. Limitations        Today's exam was limited due to movement and the patient's                    respiratory variation. Comparison Study:  No prior study Performing Technologist: Sherren Kerns RVS  Examination Guidelines: A complete evaluation includes B-mode imaging, spectral Doppler, color Doppler, and power Doppler as needed of all accessible portions of each vessel. Bilateral testing  is considered an integral part of a complete examination. Limited examinations for reoccurring indications may be performed as noted.  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 in the setting of severe, untreated atherosclerotic disease, uncontrolled risk factors and chronic tobacco use    Secondary Diagnoses: Hypertension Hyperlipidemia Tobacco abuse Severe PAD THC abuse CAD Chronic back pain  Allergies as of 04/30/2023       Reactions   Penicillins Other (See Comments)   Causes sore throat Has patient had a PCN reaction causing immediate rash, facial/tongue/throat swelling, SOB or lightheadedness with hypotension: No Has patient had a PCN reaction causing severe rash involving mucus membranes or skin necrosis: No Has patient had a PCN reaction that required hospitalization: No Has patient had a PCN reaction occurring within the last 10 years: No If all of the above answers are "NO", then may proceed with Cephalosporin use.        Medication List     STOP taking these medications    esomeprazole 10 MG packet Commonly known as: NEXIUM   naproxen sodium 220 MG tablet Commonly known as: ALEVE       TAKE these medications    acetaminophen 325 MG tablet Commonly known as: TYLENOL Take 2 tablets (650 mg total) by mouth every 6 (six) hours as needed for mild pain or moderate pain (or temp > 37.5 C (99.5 F)).   amLODipine 10 MG tablet Commonly known as: NORVASC Take 1 tablet (10 mg total) by mouth daily. Start taking on: May 01, 2023   aspirin EC 81 MG tablet Take 1 tablet (81 mg total) by mouth daily. Swallow whole. Start taking on: May 01, 2023   celecoxib 100 MG capsule Commonly known as:  CeleBREX Take 1 capsule (100 mg total) by mouth 2 (two) times daily as needed for moderate pain or mild pain.   clopidogrel 75 MG tablet Commonly known as: PLAVIX Take 1 tablet (75 mg total) by mouth daily. Start taking on: May 01, 2023   lidocaine 5 % Commonly known as: LIDODERM Place 1 patch onto the skin daily. Remove & Discard patch within 12 hours or as directed by MD Start taking on: May 01, 2023   losartan 100 MG tablet Commonly known as: COZAAR Take 1 tablet (100 mg total) by mouth daily. Start taking on: May 01, 2023   metoprolol tartrate 25 MG tablet Commonly known as: LOPRESSOR Take 1 tablet (25 mg total) by mouth 2 (two) times daily.   nicotine 21 mg/24hr patch Commonly known as: NICODERM CQ - dosed in mg/24 hours Place 1 patch (21 mg total) onto the skin daily as needed (reduce craving for cigarettes).   rosuvastatin 40 MG tablet Commonly known as: CRESTOR Take 1 tablet (40 mg total) by mouth daily. Start taking on: May 01, 2023        LABORATORY STUDIES CBC    Component Value Date/Time   WBC 9.1 04/30/2023 0618   RBC 5.70 04/30/2023 0618   HGB 16.7 04/30/2023 0618   HCT 51.3 04/30/2023 0618   PLT 204 04/30/2023 0618   MCV 90.0 04/30/2023 0618   MCH 29.3 04/30/2023 0618   MCHC 32.6 04/30/2023  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 in the setting of severe, untreated atherosclerotic disease, uncontrolled risk factors and chronic tobacco use    Secondary Diagnoses: Hypertension Hyperlipidemia Tobacco abuse Severe PAD THC abuse CAD Chronic back pain  Allergies as of 04/30/2023       Reactions   Penicillins Other (See Comments)   Causes sore throat Has patient had a PCN reaction causing immediate rash, facial/tongue/throat swelling, SOB or lightheadedness with hypotension: No Has patient had a PCN reaction causing severe rash involving mucus membranes or skin necrosis: No Has patient had a PCN reaction that required hospitalization: No Has patient had a PCN reaction occurring within the last 10 years: No If all of the above answers are "NO", then may proceed with Cephalosporin use.        Medication List     STOP taking these medications    esomeprazole 10 MG packet Commonly known as: NEXIUM   naproxen sodium 220 MG tablet Commonly known as: ALEVE       TAKE these medications    acetaminophen 325 MG tablet Commonly known as: TYLENOL Take 2 tablets (650 mg total) by mouth every 6 (six) hours as needed for mild pain or moderate pain (or temp > 37.5 C (99.5 F)).   amLODipine 10 MG tablet Commonly known as: NORVASC Take 1 tablet (10 mg total) by mouth daily. Start taking on: May 01, 2023   aspirin EC 81 MG tablet Take 1 tablet (81 mg total) by mouth daily. Swallow whole. Start taking on: May 01, 2023   celecoxib 100 MG capsule Commonly known as:  CeleBREX Take 1 capsule (100 mg total) by mouth 2 (two) times daily as needed for moderate pain or mild pain.   clopidogrel 75 MG tablet Commonly known as: PLAVIX Take 1 tablet (75 mg total) by mouth daily. Start taking on: May 01, 2023   lidocaine 5 % Commonly known as: LIDODERM Place 1 patch onto the skin daily. Remove & Discard patch within 12 hours or as directed by MD Start taking on: May 01, 2023   losartan 100 MG tablet Commonly known as: COZAAR Take 1 tablet (100 mg total) by mouth daily. Start taking on: May 01, 2023   metoprolol tartrate 25 MG tablet Commonly known as: LOPRESSOR Take 1 tablet (25 mg total) by mouth 2 (two) times daily.   nicotine 21 mg/24hr patch Commonly known as: NICODERM CQ - dosed in mg/24 hours Place 1 patch (21 mg total) onto the skin daily as needed (reduce craving for cigarettes).   rosuvastatin 40 MG tablet Commonly known as: CRESTOR Take 1 tablet (40 mg total) by mouth daily. Start taking on: May 01, 2023        LABORATORY STUDIES CBC    Component Value Date/Time   WBC 9.1 04/30/2023 0618   RBC 5.70 04/30/2023 0618   HGB 16.7 04/30/2023 0618   HCT 51.3 04/30/2023 0618   PLT 204 04/30/2023 0618   MCV 90.0 04/30/2023 0618   MCH 29.3 04/30/2023 0618   MCHC 32.6 04/30/2023  0618   RDW 14.5 04/30/2023 0618   LYMPHSABS 2.7 04/25/2023 2358   MONOABS 0.9 04/25/2023 2358   EOSABS 0.5 04/25/2023 2358   BASOSABS 0.1 04/25/2023 2358   CMP    Component Value Date/Time   NA 136 04/30/2023 0618   K 4.5 04/30/2023 0618   CL 100 04/30/2023 0618   CO2 26 04/30/2023 0618   GLUCOSE 91 04/30/2023 0618   BUN 22 (H) 04/30/2023 0618   CREATININE 1.21 04/30/2023 0618   CALCIUM 9.5 04/30/2023 0618   PROT 5.8 (L) 04/25/2023 2358   ALBUMIN 3.2 (L) 04/25/2023 2358   AST 29 04/25/2023 2358   ALT 20 04/25/2023 2358   ALKPHOS 59 04/25/2023 2358   BILITOT 0.5 04/25/2023 2358   GFRNONAA >60 04/30/2023 0618   GFRAA >60  04/27/2017 0243   COAGS Lab Results  Component Value Date   INR 1.0 04/25/2023   Lipid Panel    Component Value Date/Time   CHOL 261 (H) 04/26/2023 0340   TRIG 281 (H) 04/26/2023 1145   HDL 34 (L) 04/26/2023 0340   CHOLHDL 7.7 04/26/2023 0340   VLDL UNABLE TO CALCULATE IF TRIGLYCERIDE OVER 400 mg/dL 32/44/0102 7253   LDLCALC UNABLE TO CALCULATE IF TRIGLYCERIDE OVER 400 mg/dL 66/44/0347 4259   DGLO7F  Lab Results  Component Value Date   HGBA1C 6.0 (H) 04/26/2023   Urine Drug Screen positive for THC Alcohol Level    Component Value Date/Time   ETH <10 04/25/2023 2358     SIGNIFICANT DIAGNOSTIC STUDIES VAS US CAROTID  Result Date: 04/27/2023 Carotid Arterial Duplex Study Patient Name:  Lance Santiago  Date of Exam:   04/26/2023 Medical Rec #: [id number]           Accession #:    [id number] Date of Birth: 1960-12-02           Patient Gender: M Patient Age:   89 years Exam Location:  [facility] Procedure:      VAS US CAROTID Referring Phys: Emilie Rutter --------------------------------------------------------------------------------  Indications:       CVA (left ACA) and Weakness. Risk Factors:      Hypertension, hyperlipidemia, current smoker, coronary artery                    disease, PAD (claudication) . Other Factors:     CTA with R ICA 70% and L ICA 50% stenosis at the bifurcation.                    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 Dr. Randie Heinz                    consulted by stroke service. Limitations        Today's exam was limited due to movement and the patient's                    respiratory variation. Comparison Study:  No prior study Performing Technologist: Sherren Kerns RVS  Examination Guidelines: A complete evaluation includes B-mode imaging, spectral Doppler, color Doppler, and power Doppler as needed of all accessible portions of each vessel. Bilateral testing  is considered an integral part of a complete examination. Limited examinations for reoccurring indications may be performed as noted.  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 in the setting of severe, untreated atherosclerotic disease, uncontrolled risk factors and chronic tobacco use    Secondary Diagnoses: Hypertension Hyperlipidemia Tobacco abuse Severe PAD THC abuse CAD Chronic back pain  Allergies as of 04/30/2023       Reactions   Penicillins Other (See Comments)   Causes sore throat Has patient had a PCN reaction causing immediate rash, facial/tongue/throat swelling, SOB or lightheadedness with hypotension: No Has patient had a PCN reaction causing severe rash involving mucus membranes or skin necrosis: No Has patient had a PCN reaction that required hospitalization: No Has patient had a PCN reaction occurring within the last 10 years: No If all of the above answers are "NO", then may proceed with Cephalosporin use.        Medication List     STOP taking these medications    esomeprazole 10 MG packet Commonly known as: NEXIUM   naproxen sodium 220 MG tablet Commonly known as: ALEVE       TAKE these medications    acetaminophen 325 MG tablet Commonly known as: TYLENOL Take 2 tablets (650 mg total) by mouth every 6 (six) hours as needed for mild pain or moderate pain (or temp > 37.5 C (99.5 F)).   amLODipine 10 MG tablet Commonly known as: NORVASC Take 1 tablet (10 mg total) by mouth daily. Start taking on: May 01, 2023   aspirin EC 81 MG tablet Take 1 tablet (81 mg total) by mouth daily. Swallow whole. Start taking on: May 01, 2023   celecoxib 100 MG capsule Commonly known as:  CeleBREX Take 1 capsule (100 mg total) by mouth 2 (two) times daily as needed for moderate pain or mild pain.   clopidogrel 75 MG tablet Commonly known as: PLAVIX Take 1 tablet (75 mg total) by mouth daily. Start taking on: May 01, 2023   lidocaine 5 % Commonly known as: LIDODERM Place 1 patch onto the skin daily. Remove & Discard patch within 12 hours or as directed by MD Start taking on: May 01, 2023   losartan 100 MG tablet Commonly known as: COZAAR Take 1 tablet (100 mg total) by mouth daily. Start taking on: May 01, 2023   metoprolol tartrate 25 MG tablet Commonly known as: LOPRESSOR Take 1 tablet (25 mg total) by mouth 2 (two) times daily.   nicotine 21 mg/24hr patch Commonly known as: NICODERM CQ - dosed in mg/24 hours Place 1 patch (21 mg total) onto the skin daily as needed (reduce craving for cigarettes).   rosuvastatin 40 MG tablet Commonly known as: CRESTOR Take 1 tablet (40 mg total) by mouth daily. Start taking on: May 01, 2023        LABORATORY STUDIES CBC    Component Value Date/Time   WBC 9.1 04/30/2023 0618   RBC 5.70 04/30/2023 0618   HGB 16.7 04/30/2023 0618   HCT 51.3 04/30/2023 0618   PLT 204 04/30/2023 0618   MCV 90.0 04/30/2023 0618   MCH 29.3 04/30/2023 0618   MCHC 32.6 04/30/2023  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 in the setting of severe, untreated atherosclerotic disease, uncontrolled risk factors and chronic tobacco use    Secondary Diagnoses: Hypertension Hyperlipidemia Tobacco abuse Severe PAD THC abuse CAD Chronic back pain  Allergies as of 04/30/2023       Reactions   Penicillins Other (See Comments)   Causes sore throat Has patient had a PCN reaction causing immediate rash, facial/tongue/throat swelling, SOB or lightheadedness with hypotension: No Has patient had a PCN reaction causing severe rash involving mucus membranes or skin necrosis: No Has patient had a PCN reaction that required hospitalization: No Has patient had a PCN reaction occurring within the last 10 years: No If all of the above answers are "NO", then may proceed with Cephalosporin use.        Medication List     STOP taking these medications    esomeprazole 10 MG packet Commonly known as: NEXIUM   naproxen sodium 220 MG tablet Commonly known as: ALEVE       TAKE these medications    acetaminophen 325 MG tablet Commonly known as: TYLENOL Take 2 tablets (650 mg total) by mouth every 6 (six) hours as needed for mild pain or moderate pain (or temp > 37.5 C (99.5 F)).   amLODipine 10 MG tablet Commonly known as: NORVASC Take 1 tablet (10 mg total) by mouth daily. Start taking on: May 01, 2023   aspirin EC 81 MG tablet Take 1 tablet (81 mg total) by mouth daily. Swallow whole. Start taking on: May 01, 2023   celecoxib 100 MG capsule Commonly known as:  CeleBREX Take 1 capsule (100 mg total) by mouth 2 (two) times daily as needed for moderate pain or mild pain.   clopidogrel 75 MG tablet Commonly known as: PLAVIX Take 1 tablet (75 mg total) by mouth daily. Start taking on: May 01, 2023   lidocaine 5 % Commonly known as: LIDODERM Place 1 patch onto the skin daily. Remove & Discard patch within 12 hours or as directed by MD Start taking on: May 01, 2023   losartan 100 MG tablet Commonly known as: COZAAR Take 1 tablet (100 mg total) by mouth daily. Start taking on: May 01, 2023   metoprolol tartrate 25 MG tablet Commonly known as: LOPRESSOR Take 1 tablet (25 mg total) by mouth 2 (two) times daily.   nicotine 21 mg/24hr patch Commonly known as: NICODERM CQ - dosed in mg/24 hours Place 1 patch (21 mg total) onto the skin daily as needed (reduce craving for cigarettes).   rosuvastatin 40 MG tablet Commonly known as: CRESTOR Take 1 tablet (40 mg total) by mouth daily. Start taking on: May 01, 2023        LABORATORY STUDIES CBC    Component Value Date/Time   WBC 9.1 04/30/2023 0618   RBC 5.70 04/30/2023 0618   HGB 16.7 04/30/2023 0618   HCT 51.3 04/30/2023 0618   PLT 204 04/30/2023 0618   MCV 90.0 04/30/2023 0618   MCH 29.3 04/30/2023 0618   MCHC 32.6 04/30/2023  0618   RDW 14.5 04/30/2023 0618   LYMPHSABS 2.7 04/25/2023 2358   MONOABS 0.9 04/25/2023 2358   EOSABS 0.5 04/25/2023 2358   BASOSABS 0.1 04/25/2023 2358   CMP    Component Value Date/Time   NA 136 04/30/2023 0618   K 4.5 04/30/2023 0618   CL 100 04/30/2023 0618   CO2 26 04/30/2023 0618   GLUCOSE 91 04/30/2023 0618   BUN 22 (H) 04/30/2023 0618   CREATININE 1.21 04/30/2023 0618   CALCIUM 9.5 04/30/2023 0618   PROT 5.8 (L) 04/25/2023 2358   ALBUMIN 3.2 (L) 04/25/2023 2358   AST 29 04/25/2023 2358   ALT 20 04/25/2023 2358   ALKPHOS 59 04/25/2023 2358   BILITOT 0.5 04/25/2023 2358   GFRNONAA >60 04/30/2023 0618   GFRAA >60  04/27/2017 0243   COAGS Lab Results  Component Value Date   INR 1.0 04/25/2023   Lipid Panel    Component Value Date/Time   CHOL 261 (H) 04/26/2023 0340   TRIG 281 (H) 04/26/2023 1145   HDL 34 (L) 04/26/2023 0340   CHOLHDL 7.7 04/26/2023 0340   VLDL UNABLE TO CALCULATE IF TRIGLYCERIDE OVER 400 mg/dL 32/44/0102 7253   LDLCALC UNABLE TO CALCULATE IF TRIGLYCERIDE OVER 400 mg/dL 66/44/0347 4259   DGLO7F  Lab Results  Component Value Date   HGBA1C 6.0 (H) 04/26/2023   Urine Drug Screen positive for THC Alcohol Level    Component Value Date/Time   ETH <10 04/25/2023 2358     SIGNIFICANT DIAGNOSTIC STUDIES VAS US CAROTID  Result Date: 04/27/2023 Carotid Arterial Duplex Study Patient Name:  Lance Santiago  Date of Exam:   04/26/2023 Medical Rec #: [id number]           Accession #:    [id number] Date of Birth: 1960-12-02           Patient Gender: M Patient Age:   89 years Exam Location:  [facility] Procedure:      VAS US CAROTID Referring Phys: Emilie Rutter --------------------------------------------------------------------------------  Indications:       CVA (left ACA) and Weakness. Risk Factors:      Hypertension, hyperlipidemia, current smoker, coronary artery                    disease, PAD (claudication) . Other Factors:     CTA with R ICA 70% and L ICA 50% stenosis at the bifurcation.                    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 Dr. Randie Heinz                    consulted by stroke service. Limitations        Today's exam was limited due to movement and the patient's                    respiratory variation. Comparison Study:  No prior study Performing Technologist: Sherren Kerns RVS  Examination Guidelines: A complete evaluation includes B-mode imaging, spectral Doppler, color Doppler, and power Doppler as needed of all accessible portions of each vessel. Bilateral testing  is considered an integral part of a complete examination. Limited examinations for reoccurring indications may be performed as noted.  Right Carotid Findings: +----------+--------+--------+--------+---------------------+------------------+  0618   RDW 14.5 04/30/2023 0618   LYMPHSABS 2.7 04/25/2023 2358   MONOABS 0.9 04/25/2023 2358   EOSABS 0.5 04/25/2023 2358   BASOSABS 0.1 04/25/2023 2358   CMP    Component Value Date/Time   NA 136 04/30/2023 0618   K 4.5 04/30/2023 0618   CL 100 04/30/2023 0618   CO2 26 04/30/2023 0618   GLUCOSE 91 04/30/2023 0618   BUN 22 (H) 04/30/2023 0618   CREATININE 1.21 04/30/2023 0618   CALCIUM 9.5 04/30/2023 0618   PROT 5.8 (L) 04/25/2023 2358   ALBUMIN 3.2 (L) 04/25/2023 2358   AST 29 04/25/2023 2358   ALT 20 04/25/2023 2358   ALKPHOS 59 04/25/2023 2358   BILITOT 0.5 04/25/2023 2358   GFRNONAA >60 04/30/2023 0618   GFRAA >60  04/27/2017 0243   COAGS Lab Results  Component Value Date   INR 1.0 04/25/2023   Lipid Panel    Component Value Date/Time   CHOL 261 (H) 04/26/2023 0340   TRIG 281 (H) 04/26/2023 1145   HDL 34 (L) 04/26/2023 0340   CHOLHDL 7.7 04/26/2023 0340   VLDL UNABLE TO CALCULATE IF TRIGLYCERIDE OVER 400 mg/dL 32/44/0102 7253   LDLCALC UNABLE TO CALCULATE IF TRIGLYCERIDE OVER 400 mg/dL 66/44/0347 4259   DGLO7F  Lab Results  Component Value Date   HGBA1C 6.0 (H) 04/26/2023   Urine Drug Screen positive for THC Alcohol Level    Component Value Date/Time   ETH <10 04/25/2023 2358     SIGNIFICANT DIAGNOSTIC STUDIES VAS US CAROTID  Result Date: 04/27/2023 Carotid Arterial Duplex Study Patient Name:  Lance Santiago  Date of Exam:   04/26/2023 Medical Rec #: [id number]           Accession #:    [id number] Date of Birth: 1960-12-02           Patient Gender: M Patient Age:   89 years Exam Location:  [facility] Procedure:      VAS US CAROTID Referring Phys: Emilie Rutter --------------------------------------------------------------------------------  Indications:       CVA (left ACA) and Weakness. Risk Factors:      Hypertension, hyperlipidemia, current smoker, coronary artery                    disease, PAD (claudication) . Other Factors:     CTA with R ICA 70% and L ICA 50% stenosis at the bifurcation.                    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 Dr. Randie Heinz                    consulted by stroke service. Limitations        Today's exam was limited due to movement and the patient's                    respiratory variation. Comparison Study:  No prior study Performing Technologist: Sherren Kerns RVS  Examination Guidelines: A complete evaluation includes B-mode imaging, spectral Doppler, color Doppler, and power Doppler as needed of all accessible portions of each vessel. Bilateral testing  is considered an integral part of a complete examination. Limited examinations for reoccurring indications may be performed as noted.  Right Carotid Findings: +----------+--------+--------+--------+---------------------+------------------+

## 2023-04-28 NOTE — Progress Notes (Signed)
Inpatient Rehab Coordinator Note:  I met with patient at bedside to discuss CIR recommendations and goals/expectations of CIR stay.  We reviewed 3 hrs/day of therapy, physician follow up, and average length of stay 2 weeks (dependent upon progress) with goals of supervision.  Pt lives with his s/o, Toniann Fail, and his son, Hulices and Willies's GF.  He confirms someone is home 24/7.  We reviewed insurance/cost of CIR and he confirms he does not have a payor and is working on [employer].  He is agreeable to cost.  I will work on potential admit in the next few days pending bed availability.   Estill Dooms, PT, DPT Admissions Coordinator [phone] 04/28/23  12:38 PM

## 2023-04-28 NOTE — PMR Pre-admission (Signed)
PMR Admission Coordinator Pre-Admission Assessment  Patient: Lance Santiago is an 62 y.o., male MRN: [id number] DOB: 1961/06/20 Height: 5\' 7"  (170.2 cm) Weight: 69.7 kg  Insurance Information HMO:     PPO:      PCP:      IPA:      80/20:     OTHER:  PRIMARY: uninsured, provided estimate 04/28/23      Policy#:       Subscriber:  CM Name:       Phone#:      Fax#:  Pre-Cert#:       Employer:  Benefits:  Phone #:      Name:  Eff. Date:      Deduct:       Out of Pocket Max:       Life Max:  CIR:       SNF:  Outpatient:      Co-Pay:  Home Health:       Co-Pay:  DME:      Co-Pay:  Providers:  SECONDARY:       Policy#:      Phone#:   Artist:       Phone#:   The Data processing manager" for patients in Inpatient Rehabilitation Facilities with attached "Privacy Act Statement-Health Care Records" was provided and verbally reviewed with: Patient and Family  Emergency Contact Information Contact Information     Name Relation Home Work Mobile   Ratledge,Robin Relative [phone]        Other Contacts   None on File     Current Medical History  Patient Admitting Diagnosis: CVA   History of Present Illness: Pt is a 62 y/o male with PMH of HTN, CAD s/p stenting, lumbar laminectomy, and SHF admitted to [facility] on 04/26/23 with c/o right sided weakness of sudden onset.  In ED BP 151/93, potassium 5.4, RLE cold and without pulses.  NIHSS 6.  CT head showed L posterior frontal, R cerebellar, and R posterior temporal lobe encephalomalacia consistent with chronic infarcts.  CTA showed R and L ICA stenosis.  CT angio negative for dissection.  MRI showed developing acute L ACA stroke and he was given TNK.  Echo showed EF 50-55%,  Hgb A1C 6.0.  Pt did require Cleviprex infusion transition to cardene drip due to triglycerides.  BP goal <180/105.  Therapy evaluations completed and pt was recommended for CIR.   Complete NIHSS TOTAL: 3  Patient's medical record from  Lance Santiago has been reviewed by the rehabilitation admission coordinator and physician.  Past Medical History  Past Medical History:  Diagnosis Date   Coronary artery disease    Hyperlipidemia    Hypertension    Lumbar disc disease    Myocardial infarction (HCC)    9/18 PCI/DESx1 to RCA, EF 45%   Smoking    Systolic heart failure (HCC)     Has the patient had major surgery during 100 days prior to admission? Yes  Family History   family history includes Coronary artery disease in his father; Diabetes in his mother.  Current Medications  Current Facility-Administered Medications:    acetaminophen (TYLENOL) tablet 650 mg, 650 mg, Oral, Q4H PRN, 650 mg at 04/27/23 1759 **OR** acetaminophen (TYLENOL) 160 MG/5ML solution 650 mg, 650 mg, Per Tube, Q4H PRN **OR** acetaminophen (TYLENOL) suppository 650 mg, 650 mg, Rectal, Q4H PRN, Erick Blinks, MD   amLODipine (NORVASC) tablet 10 mg, 10 mg, Oral, Daily, Marvel Plan, MD, 10 mg at 04/30/23 [phone]  aspirin EC tablet 81 mg, 81 mg, Oral, Daily, Caryl Pina, MD, 81 mg at 04/30/23 0916   Chlorhexidine Gluconate Cloth 2 % PADS 6 each, 6 each, Topical, Q2000, Erick Blinks, MD, 6 each at 04/28/23 2100   clopidogrel (PLAVIX) tablet 75 mg, 75 mg, Oral, Daily, Marvel Plan, MD, 75 mg at 04/30/23 0916   enoxaparin (LOVENOX) injection 40 mg, 40 mg, Subcutaneous, Q24H, Marvel Plan, MD, 40 mg at 04/30/23 0916   hydrALAZINE (APRESOLINE) injection 10 mg, 10 mg, Intravenous, Q4H PRN, de Saintclair Halsted, Cortney E, NP, 10 mg at 04/29/23 2141   labetalol (NORMODYNE) injection 10 mg, 10 mg, Intravenous, Q2H PRN, de Saintclair Halsted, Cortney E, NP   lidocaine (LIDODERM) 5 % 1 patch, 1 patch, Transdermal, Daily, Tomie China, MD, 1 patch at 04/30/23 0916   losartan (COZAAR) tablet 100 mg, 100 mg, Oral, Daily, de Saintclair Halsted, [place] E, NP, 100 mg at 04/30/23 0916   metoprolol tartrate (LOPRESSOR) tablet 25 mg, 25 mg, Oral, BID, Marvel Plan, MD, 25 mg at 04/30/23  0916   nicotine (NICODERM CQ - dosed in mg/24 hours) patch 21 mg, 21 mg, Transdermal, Daily PRN, Erick Blinks, MD, 21 mg at 04/29/23 1833   Oral care mouth rinse, 15 mL, Mouth Rinse, PRN, Erick Blinks, MD   pantoprazole (PROTONIX) EC tablet 40 mg, 40 mg, Oral, QHS, Marvel Plan, MD, 40 mg at 04/29/23 2056   rosuvastatin (CRESTOR) tablet 40 mg, 40 mg, Oral, Daily, Tomie China, MD, 40 mg at 04/30/23 7829   senna-docusate (Senokot-S) tablet 1 tablet, 1 tablet, Oral, QHS PRN, Erick Blinks, MD   traMADol Janean Sark) tablet 50 mg, 50 mg, Oral, Q6H PRN, Tomie China, MD, 50 mg at 04/30/23 5621  Patients Current Diet:  Diet Order             Diet regular Room service appropriate? Yes; Fluid consistency: Thin  Diet effective now                   Precautions / Restrictions Precautions Precautions: Fall, Other (comment) Precaution Comments: BP <180/105 Restrictions Weight Bearing Restrictions: No   Has the patient had 2 or more falls or a fall with injury in the past year? No  Prior Activity Level Community (5-7x/wk): indep without device, driving, not working  Prior Functional Level Self Care: Did the patient need help bathing, dressing, using the toilet or eating? Independent  Indoor Mobility: Did the patient need assistance with walking from room to room (with or without device)? Independent  Stairs: Did the patient need assistance with internal or external stairs (with or without device)? Independent  Functional Cognition: Did the patient need help planning regular tasks such as shopping or remembering to take medications? Independent  Patient Information Are you of Hispanic, Latino/a,or Spanish origin?: A. No, not of Hispanic, Latino/a, or Spanish origin What is your race?: A. White Do you need or want an interpreter to communicate with a doctor or health care staff?: 0. No  Patient's Response To:  Health Literacy and Transportation Is the  patient able to respond to health literacy and transportation needs?: Yes Health Literacy - How often do you need to have someone help you when you read instructions, pamphlets, or other written material from your doctor or pharmacy?: Never In the past 12 months, has lack of transportation kept you from medical appointments or from getting medications?: No In the past 12 months, has lack of transportation kept you from meetings, work, or from getting things  needed for daily living?: No  Home Assistive Devices / Equipment Home Assistive Devices/Equipment: None Home Equipment: None  Prior Device Use: Indicate devices/aids used by the patient prior to current illness, exacerbation or injury? None of the above  Current Functional Level Cognition  Arousal/Alertness: Awake/alert Overall Cognitive Status: Impaired/Different from baseline Orientation Level: Oriented X4 Safety/Judgement: Decreased awareness of deficits, Decreased awareness of safety General Comments: cues for safety and sequencing Attention: Focused Focused Attention: Impaired Focused Attention Impairment: Verbal complex, Functional complex Memory: Impaired Memory Impairment: Decreased short term memory, Decreased recall of new information, Retrieval deficit Decreased Short Term Memory: Verbal complex, Functional complex Awareness: Appears intact    Extremity Assessment (includes Sensation/Coordination)  Upper Extremity Assessment: Right hand dominant, RUE deficits/detail RUE Deficits / Details: ROM with WFL with the exception of thumb to pinky opposition. Sensation is WFL, proprioception is impaired. Slowed and deliberate coordination. Strength is grossly 4/5 RUE Sensation: decreased proprioception RUE Coordination: decreased fine motor, decreased gross motor  Lower Extremity Assessment: Defer to PT evaluation RLE Deficits / Details: pt able to perform hip flexion to lift leg in supine and limited ability to lift leg in  standing, no AROM in sitting for hip flexion/knee flexion/ knee extension, pt reports normal sensation    ADLs  Overall ADL's : Needs assistance/impaired Eating/Feeding: Set up, Sitting Grooming: Supervision/safety, Sitting Grooming Details (indicate cue type and reason): at sink Upper Body Bathing: Minimal assistance, Sitting Upper Body Bathing Details (indicate cue type and reason): assistance for back Lower Body Bathing: Moderate assistance, Sit to/from stand Lower Body Bathing Details (indicate cue type and reason): able to bathe legs while seated requires assistance with periarea back while standing due to reliant on BUE support Upper Body Dressing : Supervision/safety, Sitting Upper Body Dressing Details (indicate cue type and reason): donn gown Lower Body Dressing: Moderate assistance, Sitting/lateral leans Lower Body Dressing Details (indicate cue type and reason): able to doff socks and donn left sock. assistance to maintain figure 4 with RLE and mod assist to donn sock Toilet Transfer: Minimal assistance, +2 for physical assistance, +2 for safety/equipment, Cueing for safety, Cueing for sequencing, Ambulation, BSC/3in1, Rolling walker (2 wheels) Toileting- Clothing Manipulation and Hygiene: Minimal assistance, Sitting/lateral lean Functional mobility during ADLs: Minimal assistance, +2 for physical assistance, +2 for safety/equipment, Rolling walker (2 wheels) General ADL Comments: increased use of RUE    Mobility  Overal bed mobility: Needs Assistance Bed Mobility: Supine to Sit Supine to sit: Min assist, HOB elevated General bed mobility comments: up in chair on arrival    Transfers  Overall transfer level: Needs assistance Equipment used: Rolling walker (2 wheels) Transfers: Sit to/from Stand, Bed to chair/wheelchair/BSC Sit to Stand: Min assist Bed to/from chair/wheelchair/BSC transfer type:: Stand pivot Stand pivot transfers: Contact guard assist General transfer  comment: min assist for transfers    Ambulation / Gait / Stairs / Wheelchair Mobility  Ambulation/Gait Ambulation/Gait assistance: Editor, commissioning (Feet): 12 Feet Assistive device: Rolling walker (2 wheels) Gait Pattern/deviations: Step-to pattern General Gait Details: pt with assist to advance RLE and direct/control RW with stepping. short gait in room with ditance limited by elevated BP up to 182/109 Gait velocity interpretation: <1.31 ft/sec, indicative of household ambulator    Posture / Balance Balance Overall balance assessment: Needs assistance Sitting-balance support: No upper extremity supported, Feet supported Sitting balance-Leahy Scale: Good Standing balance support: Bilateral upper extremity supported Standing balance-Leahy Scale: Poor Standing balance comment: reliant on BUE support when standing    Special  needs/care consideration Diabetic management yes   Previous Home Environment (from acute therapy documentation) Living Arrangements: Spouse/significant other  Lives With: Significant other, Son Available Help at Discharge: Family, Available PRN/intermittently Type of Home: House Home Layout: One level Home Access: Stairs to enter Secretary/administrator of Steps: 2 Bathroom Shower/Tub: Engineer, manufacturing systems: Standard Home Care Services: No Additional Comments: girlfriend works 3rd shift, son works 1st shift and 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  Discharge Living Setting Plans for Discharge Living Setting: Patient's home, Lives with (comment) (significant other Toniann Fail), son (Briton) and Demondre's GF) Type of Home at Discharge: House Discharge Home Layout: One level Discharge Home Access: Stairs to enter Entrance Stairs-Rails: None Entrance Stairs-Number of Steps: 2 Discharge Bathroom Shower/Tub: Tub/shower unit Discharge Bathroom Toilet: Standard Discharge Bathroom Accessibility: Yes How Accessible: Accessible via  walker Does the patient have any problems obtaining your medications?: Yes (Describe)  Social/Family/Support Systems Anticipated Caregiver: Toniann Fail (s/o) and Gaylan (son) Anticipated Caregiver's Contact Information: Toniann Fail [phone] Ability/Limitations of Caregiver: supervision Caregiver Availability: 24/7 Discharge Plan Discussed with Primary Caregiver: Yes Is Caregiver In Agreement with Plan?: Yes Does Caregiver/Family have Issues with Lodging/Transportation while Pt is in Rehab?: No  Goals Patient/Family Goal for Rehab: PT/OT/SLP supervision Expected length of stay: 10-12 days Additional Information: Discharge plan: return to pt's home with s/o, son, and son's s/o providing 24/7 supervision Pt/Family Agrees to Admission and willing to participate: Yes Program Orientation Provided & Reviewed with Pt/Caregiver Including Roles  & Responsibilities: Yes  Barriers to Discharge: Insurance for SNF coverage  Decrease burden of Care through IP rehab admission: n/a  Possible need for SNF placement upon discharge:  Not anticipated.  Plan for d/c home with significant other, Toniann Fail, and his son, Aalijah, providing 24/7 supervision.  Geffrey's girlfriend is also present in the home.   Patient Condition: I have reviewed medical records from [facility], spoken with  [facility] team , and patient. I met with patient at the bedside for inpatient rehabilitation assessment.  Patient will benefit from ongoing PT, OT, and SLP, can actively participate in 3 hours of therapy a day 5 days of the week, and can make measurable gains during the admission.  Patient will also benefit from the coordinated team approach during an Inpatient Acute Rehabilitation admission.  The patient will receive intensive therapy as well as Rehabilitation physician, nursing, social worker, and care management interventions.  Due to safety, skin/wound care, disease management, medication administration, pain management, and patient education  the patient requires 24 hour a day rehabilitation nursing.  The patient is currently min asisst with mobility and basic ADLs.  Discharge setting and therapy post discharge at home with home health is anticipated.  Patient has agreed to participate in the Acute Inpatient Rehabilitation Program and will admit today.  Preadmission Screen Completed By:  Stephania Fragmin, PT, DPT 04/30/2023 9:52 AM ______________________________________________________________________   Discussed status with Dr. Riley Kill on 04/30/23  at 9:52 AM  and received approval for admission today.  Admission Coordinator:  Stephania Fragmin, PT, DPT time 9:52 AM Dorna Bloom 04/30/23    Assessment/Plan: Diagnosis: CVA Does the need for close, 24 hr/day Medical supervision in concert with the patient's rehab needs make it unreasonable for this patient to be served in a less intensive setting? Yes Co-Morbidities requiring supervision/potential complications: HTN, CAD, lumbar DDD, CHF Due to bladder management, bowel management, safety, skin/wound care, disease management, medication administration, pain management, and patient education, does the patient require 24 hr/day rehab nursing? Yes  Does the patient require coordinated care of a physician, rehab nurse, PT, OT, and SLP to address physical and functional deficits in the context of the above medical diagnosis(es)? Yes Addressing deficits in the following areas: balance, endurance, locomotion, strength, transferring, bowel/bladder control, bathing, dressing, feeding, grooming, toileting, cognition, speech, and psychosocial support Can the patient actively participate in an intensive therapy program of at least 3 hrs of therapy 5 days a week? Yes The potential for patient to make measurable gains while on inpatient rehab is excellent Anticipated functional outcomes upon discharge from inpatient rehab: supervision PT, supervision OT, supervision SLP Estimated rehab length of stay to reach  the above functional goals is: 10-12 days Anticipated discharge destination: Home 10. Overall Rehab/Functional Prognosis: excellent   MD Signature: Ranelle Oyster, MD, [facility] Physical Medicine & Rehabilitation Medical Director Rehabilitation Services 04/30/2023

## 2023-04-29 DIAGNOSIS — I63522 Cerebral infarction due to unspecified occlusion or stenosis of left anterior cerebral artery: Principal | ICD-10-CM

## 2023-04-29 LAB — BASIC METABOLIC PANEL WITH GFR
Anion gap: 13 (ref 5–15)
BUN: 21 mg/dL — ABNORMAL HIGH (ref 6–20)
CO2: 20 mmol/L — ABNORMAL LOW (ref 22–32)
Calcium: 9.3 mg/dL (ref 8.9–10.3)
Chloride: 102 mmol/L (ref 98–111)
Creatinine, Ser: 1.24 mg/dL (ref 0.61–1.24)
GFR, Estimated: >60 mL/min
Glucose, Bld: 109 mg/dL — ABNORMAL HIGH (ref 70–99)
Potassium: 4.2 mmol/L (ref 3.5–5.1)
Sodium: 135 mmol/L (ref 135–145)

## 2023-04-29 LAB — CBC
HCT: 50.3 % (ref 39.0–52.0)
Hemoglobin: 16.2 g/dL (ref 13.0–17.0)
MCH: 29.2 pg (ref 26.0–34.0)
MCHC: 32.2 g/dL (ref 30.0–36.0)
MCV: 90.8 fL (ref 80.0–100.0)
Platelets: 182 K/uL (ref 150–400)
RBC: 5.54 MIL/uL (ref 4.22–5.81)
RDW: 14.8 % (ref 11.5–15.5)
WBC: 7.8 K/uL (ref 4.0–10.5)
nRBC: 0 % (ref 0.0–0.2)

## 2023-04-29 NOTE — Progress Notes (Addendum)
STROKE TEAM PROGRESS NOTE   BRIEF HPI Mr. Lance Santiago is a 62 y.o. male with history of CAD, HTN, HLD, tobacco use disorder (47 pack-years) who presents with acute RLE weakness. Pulses absent, cold to touch. CT Stroke showed multiple chronic infarcts. Non-compliant with home meds. CT stroke: no LVO. CTA femoral showed significant occlusion of bilateral femoral arteries L>R. MRI showed L ACA infarct. TNK given @0045  9/16.    SIGNIFICANT HOSPITAL EVENTS 9/16: Admitted to Hospital, significant bilateral femoral artery occlusion, MRI showed left ACA infarct, TNK given 12:45 AM 9/17: Significant right upper extremity improvement, PT/OT have seen 9/19: TTF  INTERIM HISTORY/SUBJECTIVE  Patient back pain much improved.  Continued 0 out of 5 strength in the right lower extremity.  No new concerns.  Increased BP meds yesterday.  OBJECTIVE  CBC    Component Value Date/Time   WBC 7.8 04/29/2023 0753   RBC 5.54 04/29/2023 0753   HGB 16.2 04/29/2023 0753   HCT 50.3 04/29/2023 0753   PLT 182 04/29/2023 0753   MCV 90.8 04/29/2023 0753   MCH 29.2 04/29/2023 0753   MCHC 32.2 04/29/2023 0753   RDW 14.8 04/29/2023 0753   LYMPHSABS 2.7 04/25/2023 2358   MONOABS 0.9 04/25/2023 2358   EOSABS 0.5 04/25/2023 2358   BASOSABS 0.1 04/25/2023 2358    BMET    Component Value Date/Time   NA 135 04/29/2023 0753   K 4.2 04/29/2023 0753   CL 102 04/29/2023 0753   CO2 20 (L) 04/29/2023 0753   GLUCOSE 109 (H) 04/29/2023 0753   BUN 21 (H) 04/29/2023 0753   CREATININE 1.24 04/29/2023 0753   CALCIUM 9.3 04/29/2023 0753   GFRNONAA >60 04/29/2023 0753    IMAGING past 24 hours No results found.  Vitals:   04/28/23 2358 04/29/23 0407 04/29/23 0744 04/29/23 1100  BP: (!) 158/100 (!) 150/109 (!) 144/109 (!) 166/109  Pulse: 87 87 90 89  Resp:   15 18  Temp: 97.9 F (36.6 C) 97.7 F (36.5 C)  98.2 F (36.8 C)  TempSrc: Oral Oral Oral Oral  SpO2: 93% 99% 98% 98%  Weight:      Height:          PHYSICAL EXAM General: Alert male sitting up in chair, no acute distress, Psych:  Mood and affect appropriate for situation CV: Regular rate and rhythm on monitor Respiratory:  Regular, unlabored respirations on room air GI: Abdomen soft and nontender   NEURO:  Mental Status: AA&Ox3, patient is able to give clear and coherent history Speech/Language: speech is without dysarthria or aphasia.    Cranial Nerves:  II: PERRL. Visual fields full.  III, IV, VI: EOMI. Eyelids elevate symmetrically.  V: Sensation is intact to light touch and symmetrical to face.  VII: Face is symmetrical resting and smiling VIII: hearing intact to voice. IX, X: Palate elevates symmetrically. Phonation is normal.  OZ:HYQMVHQI shrug 5/5. XII: tongue is midline without fasciculations. Motor: Right upper extremity 5/5, improved, right lower extremity 0/5.  Left side normal. Tone: is normal and bulk is normal Sensation: Intact to light touch bilaterally.   Coordination: FTN intact bilaterally Gait: deferred   ASSESSMENT/PLAN  No change from yesterday.  Continues to have 0 out of 5 RLE strength.  Sensation remains intact.  No new complaints.  We will continue to monitor blood pressure.  Waiting for CIR bed today.  Is cleared from a neurologic perspective.  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 ASPECTS 10.    CTA head & neck: No LVO. Severe stenosis: distal R P3 and L P1. 70% R proximal ICA stenosis. 50% L proximal ICA stenosis and moderate to severe left ICA siphon stenosis.    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 Occlusion of L common iliac artery, severe narrowing of R common iliac. 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  Add metoprolol 25 bid off cardene now Long term BP goal normotensive  Hyperlipidemia Home meds:  none. LDL 166, goal < 70  TG 573->281 Added rosuvastatin 40 Continue statin on discharge  Tobacco Abuse 40+ pack year tobacco use history Nicotine patch PRN tobacco cessation education provided Pt is willing to quit  Other Stroke Risk Factors Coronary artery disease THC abuse, cessation education provided  Other problems Hyperkalemia to 5.4: Resolved to 4.1 today Hx of back pain - tylenol and tramadol PRN - pt takes aleve at home  Hospital day # 3  Tomie China, MD Psychiatry resident, PGY 1  ATTENDING NOTE: I reviewed above note and agree with the assessment and plan. Pt was seen and examined.   No family at the bedside. Pt sitting in chair, stated back pain at his baseline, tolerable. But still has right LE plegic. On DAPT and statin, pending CIR placement. BP on the high end, but improved with 3 BP meds. Smoking cessation education again provided.   For detailed assessment and plan, please refer to above/below as I have made changes wherever appropriate.   Marvel Plan, MD PhD Stroke Neurology 04/29/2023 10:42 PM    To contact Stroke Continuity provider, please refer to  [email or web address]. After hours, contact General Neurology

## 2023-04-29 NOTE — Plan of Care (Signed)
  Problem: Education: Goal: Knowledge of disease or condition will improve Outcome: Progressing Goal: Knowledge of secondary prevention will improve (MUST DOCUMENT ALL) Outcome: Progressing Goal: Knowledge of patient specific risk factors will improve Loraine Leriche N/A or DELETE if not current risk factor) Outcome: Progressing   Problem: Ischemic Stroke/TIA Tissue Perfusion: Goal: Complications of ischemic stroke/TIA will be minimized Outcome: Progressing   Problem: Coping: Goal: Will verbalize positive feelings about self Outcome: Progressing Goal: Will identify appropriate support needs Outcome: Progressing   Problem: Health Behavior/Discharge Planning: Goal: Ability to manage health-related needs will improve Outcome: Progressing Goal: Goals will be collaboratively established with patient/family Outcome: Progressing   Problem: Self-Care: Goal: Ability to participate in self-care as condition permits will improve Outcome: Progressing Goal: Verbalization of feelings and concerns over difficulty with self-care will improve Outcome: Progressing Goal: Ability to communicate needs accurately will improve Outcome: Progressing   Problem: Nutrition: Goal: Risk of aspiration will decrease Outcome: Progressing Goal: Dietary intake will improve Outcome: Progressing   Problem: Education: Goal: Knowledge of General Education information will improve Description: Including pain rating scale, medication(s)/side effects and non-pharmacologic comfort measures Outcome: Progressing   Problem: Health Behavior/Discharge Planning: Goal: Ability to manage health-related needs will improve Outcome: Progressing   Problem: Clinical Measurements: Goal: Ability to maintain clinical measurements within normal limits will improve Outcome: Progressing Goal: Will remain free from infection Outcome: Progressing Goal: Diagnostic test results will improve Outcome: Progressing Goal: Respiratory  complications will improve Outcome: Progressing Goal: Cardiovascular complication will be avoided Outcome: Progressing   Problem: Activity: Goal: Risk for activity intolerance will decrease Outcome: Progressing   Problem: Nutrition: Goal: Adequate nutrition will be maintained Outcome: Progressing   Problem: Coping: Goal: Level of anxiety will decrease Outcome: Progressing   Problem: Elimination: Goal: Will not experience complications related to bowel motility Outcome: Progressing Goal: Will not experience complications related to urinary retention Outcome: Progressing   Problem: Pain Managment: Goal: General experience of comfort will improve Outcome: Progressing   Problem: Safety: Goal: Ability to remain free from injury will improve Outcome: Progressing   Problem: Skin Integrity: Goal: Risk for impaired skin integrity will decrease Outcome: Progressing

## 2023-04-29 NOTE — Progress Notes (Signed)
Inpatient Rehab Admissions Coordinator:   I have no beds available for this patient to admit to CIR today.  Will continue to follow for timing of potential admission pending bed availability.   Estill Dooms, PT, DPT Admissions Coordinator [phone] 04/29/23  10:49 AM

## 2023-04-29 NOTE — Progress Notes (Signed)
Occupational Therapy Treatment Patient Details Name: Lance Santiago MRN: [id number] DOB: 17-Mar-1961 Today's Date: 04/29/2023   History of present illness 62 y.o. male admitted 9/16 with Rt weakness with MRI showing acute left ACA infarct s/p TNK. Pt also with iiac artery occlusion Lt>Rt. PMhx: CAD s/p stent, HLD, HTN, tobacco abuse, lumbar laminectomy, and systolic heart failure.   OT comments  Patient demonstrating good gains with OT treatment with increased functional use and strength with RUE. Patient was min assist to stand from recliner and mod assist to ambulate to sink due to assistance needed with advancing RLE. Patient performed self care seated at sink with cues for sequencing and returned to recliner following. Patient will benefit from intensive inpatient follow up therapy, >3 hours/day to address bathing, dressing, toilet transfers, and RUE HEP. Acute OT to continue to follow.       If plan is discharge home, recommend the following:  A lot of help with walking and/or transfers;A lot of help with bathing/dressing/bathroom;Assistance with cooking/housework;Direct supervision/assist for medications management;Direct supervision/assist for financial management;Assist for transportation;Help with stairs or ramp for entrance   Equipment Recommendations  BSC/3in1;Other (comment);Tub/shower seat (RW)    Recommendations for Other Services Rehab consult    Precautions / Restrictions Precautions Precautions: Fall;Other (comment) Precaution Comments: BP <180/105 Restrictions Weight Bearing Restrictions: No       Mobility Bed Mobility Overal bed mobility: Needs Assistance             General bed mobility comments: up in chair on arrival    Transfers Overall transfer level: Needs assistance Equipment used: Rolling walker (2 wheels) Transfers: Sit to/from Stand, Bed to chair/wheelchair/BSC Sit to Stand: Min assist           General transfer comment: min assist for  transfers     Balance Overall balance assessment: Needs assistance Sitting-balance support: No upper extremity supported, Feet supported Sitting balance-Leahy Scale: Good     Standing balance support: Bilateral upper extremity supported Standing balance-Leahy Scale: Poor Standing balance comment: reliant on BUE support when standing                           ADL either performed or assessed with clinical judgement   ADL Overall ADL's : Needs assistance/impaired     Grooming: Supervision/safety;Sitting Grooming Details (indicate cue type and reason): at sink Upper Body Bathing: Minimal assistance;Sitting Upper Body Bathing Details (indicate cue type and reason): assistance for back Lower Body Bathing: Moderate assistance;Sit to/from stand Lower Body Bathing Details (indicate cue type and reason): able to bathe legs while seated requires assistance with peria[place] back while standing due to reliant on BUE support Upper Body Dressing : Supervision/safety;Sitting Upper Body Dressing Details (indicate cue type and reason): donn gown Lower Body Dressing: Moderate assistance;Sitting/lateral leans Lower Body Dressing Details (indicate cue type and reason): able to doff socks and donn left sock. assistance to maintain figure 4 with RLE and mod assist to donn sock               General ADL Comments: increased use of RUE    Extremity/Trunk Assessment              Vision       Perception     Praxis      Cognition Arousal: Alert Behavior During Therapy: WFL for tasks assessed/performed Overall Cognitive Status: Impaired/Different from baseline Area of Impairment: Safety/judgement  Safety/Judgement: Decreased awareness of deficits, Decreased awareness of safety     General Comments: cues for safety and sequencing        Exercises      Shoulder Instructions       General Comments      Pertinent Vitals/ Pain        Pain Assessment Pain Assessment: No/denies pain  Home Living                                          Prior Functioning/Environment              Frequency  Min 1X/week        Progress Toward Goals  OT Goals(current goals can now be found in the care plan section)  Progress towards OT goals: Progressing toward goals  Acute Rehab OT Goals Patient Stated Goal: get stronger OT Goal Formulation: With patient Time For Goal Achievement: 05/11/23 Potential to Achieve Goals: Good ADL Goals Pt Will Perform Grooming: with modified independence Pt Will Perform Upper Body Dressing: with modified independence Pt Will Perform Lower Body Dressing: with contact guard assist;sit to/from stand Pt Will Transfer to Toilet: with contact guard assist;ambulating Pt/caregiver will Perform Home Exercise Program: Increased ROM;Increased strength;Right Upper extremity;With written HEP provided  Plan      Co-evaluation                 AM-PAC OT "6 Clicks" Daily Activity     Outcome Measure   Help from another person eating meals?: A Little Help from another person taking care of personal grooming?: A Little Help from another person toileting, which includes using toliet, bedpan, or urinal?: A Little Help from another person bathing (including washing, rinsing, drying)?: A Lot Help from another person to put on and taking off regular upper body clothing?: A Little Help from another person to put on and taking off regular lower body clothing?: A Lot 6 Click Score: 16    End of Session Equipment Utilized During Treatment: Rolling walker (2 wheels);Gait belt  OT Visit Diagnosis: Other abnormalities of gait and mobility (R26.89);Unsteadiness on feet (R26.81);Muscle weakness (generalized) (M62.81);Hemiplegia and hemiparesis Hemiplegia - Right/Left: Right Hemiplegia - dominant/non-dominant: Dominant Hemiplegia - caused by: Cerebral infarction   Activity Tolerance  Patient tolerated treatment well   Patient Left in chair;with call bell/phone within reach;with chair alarm set   Nurse Communication Mobility status        Time: 1610-9604 OT Time Calculation (min): 23 min  Charges: OT General Charges $OT Visit: 1 Visit OT Treatments $Self Care/Home Management : 23-37 mins  Alfonse Flavors, OTA Acute Rehabilitation Services  Office [phone]   Dewain Penning 04/29/2023, 2:30 PM

## 2023-04-30 ENCOUNTER — Encounter (HOSPITAL_COMMUNITY): Payer: Self-pay | Admitting: Physical Medicine and Rehabilitation

## 2023-04-30 ENCOUNTER — Other Ambulatory Visit: Payer: Self-pay

## 2023-04-30 ENCOUNTER — Other Ambulatory Visit (HOSPITAL_COMMUNITY): Payer: Self-pay

## 2023-04-30 ENCOUNTER — Encounter (HOSPITAL_COMMUNITY): Payer: Self-pay | Admitting: Neurology

## 2023-04-30 ENCOUNTER — Inpatient Hospital Stay (HOSPITAL_COMMUNITY)
Admission: RE | Admit: 2023-04-30 | Discharge: 2023-05-07 | DRG: 057 | Disposition: A | Discharge status: Home / Self Care | Payer: Medicaid Other | Source: Intra-hospital | Attending: Physical Medicine and Rehabilitation | Admitting: Physical Medicine and Rehabilitation

## 2023-04-30 DIAGNOSIS — M545 Low back pain, unspecified: Secondary | ICD-10-CM | POA: Diagnosis present

## 2023-04-30 DIAGNOSIS — E871 Hypo-osmolality and hyponatremia: Secondary | ICD-10-CM | POA: Diagnosis present

## 2023-04-30 DIAGNOSIS — I10 Essential (primary) hypertension: Secondary | ICD-10-CM | POA: Diagnosis present

## 2023-04-30 DIAGNOSIS — I69351 Hemiplegia and hemiparesis following cerebral infarction affecting right dominant side: Secondary | ICD-10-CM | POA: Diagnosis present

## 2023-04-30 DIAGNOSIS — R4189 Other symptoms and signs involving cognitive functions and awareness: Secondary | ICD-10-CM | POA: Diagnosis present

## 2023-04-30 DIAGNOSIS — R7989 Other specified abnormal findings of blood chemistry: Secondary | ICD-10-CM | POA: Diagnosis not present

## 2023-04-30 DIAGNOSIS — I252 Old myocardial infarction: Secondary | ICD-10-CM

## 2023-04-30 DIAGNOSIS — R4587 Impulsiveness: Secondary | ICD-10-CM | POA: Diagnosis present

## 2023-04-30 DIAGNOSIS — I6521 Occlusion and stenosis of right carotid artery: Secondary | ICD-10-CM | POA: Diagnosis present

## 2023-04-30 DIAGNOSIS — E785 Hyperlipidemia, unspecified: Secondary | ICD-10-CM | POA: Diagnosis present

## 2023-04-30 DIAGNOSIS — E559 Vitamin D deficiency, unspecified: Secondary | ICD-10-CM | POA: Insufficient documentation

## 2023-04-30 DIAGNOSIS — I25810 Atherosclerosis of coronary artery bypass graft(s) without angina pectoris: Secondary | ICD-10-CM

## 2023-04-30 DIAGNOSIS — Z880 Allergy status to penicillin: Secondary | ICD-10-CM | POA: Diagnosis not present

## 2023-04-30 DIAGNOSIS — Z8249 Family history of ischemic heart disease and other diseases of the circulatory system: Secondary | ICD-10-CM | POA: Diagnosis not present

## 2023-04-30 DIAGNOSIS — I63522 Cerebral infarction due to unspecified occlusion or stenosis of left anterior cerebral artery: Secondary | ICD-10-CM | POA: Diagnosis not present

## 2023-04-30 DIAGNOSIS — G4700 Insomnia, unspecified: Secondary | ICD-10-CM | POA: Diagnosis present

## 2023-04-30 DIAGNOSIS — F17210 Nicotine dependence, cigarettes, uncomplicated: Secondary | ICD-10-CM | POA: Diagnosis present

## 2023-04-30 DIAGNOSIS — Z79899 Other long term (current) drug therapy: Secondary | ICD-10-CM | POA: Diagnosis not present

## 2023-04-30 DIAGNOSIS — I5022 Chronic systolic (congestive) heart failure: Secondary | ICD-10-CM | POA: Diagnosis present

## 2023-04-30 DIAGNOSIS — I69322 Dysarthria following cerebral infarction: Secondary | ICD-10-CM | POA: Diagnosis not present

## 2023-04-30 DIAGNOSIS — Z7409 Other reduced mobility: Secondary | ICD-10-CM | POA: Diagnosis present

## 2023-04-30 DIAGNOSIS — I110 Hypertensive heart disease with heart failure: Secondary | ICD-10-CM | POA: Diagnosis present

## 2023-04-30 DIAGNOSIS — Z833 Family history of diabetes mellitus: Secondary | ICD-10-CM

## 2023-04-30 DIAGNOSIS — D751 Secondary polycythemia: Secondary | ICD-10-CM | POA: Diagnosis present

## 2023-04-30 DIAGNOSIS — I2510 Atherosclerotic heart disease of native coronary artery without angina pectoris: Secondary | ICD-10-CM | POA: Diagnosis present

## 2023-04-30 DIAGNOSIS — Z720 Tobacco use: Secondary | ICD-10-CM | POA: Diagnosis present

## 2023-04-30 HISTORY — DX: Cerebral infarction due to unspecified occlusion or stenosis of left anterior cerebral artery: I63.522

## 2023-04-30 LAB — BASIC METABOLIC PANEL
Anion gap: 10 (ref 5–15)
BUN: 22 mg/dL — ABNORMAL HIGH (ref 6–20)
CO2: 26 mmol/L (ref 22–32)
Calcium: 9.5 mg/dL (ref 8.9–10.3)
Chloride: 100 mmol/L (ref 98–111)
Creatinine, Ser: 1.21 mg/dL (ref 0.61–1.24)
GFR, Estimated: >60 mL/min (ref >60)
Glucose, Bld: 91 mg/dL (ref 70–99)
Potassium: 4.5 mmol/L (ref 3.5–5.1)
Sodium: 136 mmol/L (ref 135–145)

## 2023-04-30 LAB — CBC
HCT: 51.3 % (ref 39.0–52.0)
Hemoglobin: 16.7 g/dL (ref 13.0–17.0)
MCH: 29.3 pg (ref 26.0–34.0)
MCHC: 32.6 g/dL (ref 30.0–36.0)
MCV: 90 fL (ref 80.0–100.0)
Platelets: 204 10*3/uL (ref 150–400)
RBC: 5.7 MIL/uL (ref 4.22–5.81)
RDW: 14.5 % (ref 11.5–15.5)
WBC: 9.1 10*3/uL (ref 4.0–10.5)
nRBC: 0 % (ref 0.0–0.2)

## 2023-04-30 MED ORDER — ORAL CARE MOUTH RINSE: 15.0000 mL | OROMUCOSAL | Status: DC | PRN

## 2023-04-30 MED ORDER — CLOPIDOGREL BISULFATE 75 MG PO TABS: 75.0000 mg | ORAL_TABLET | Freq: Every day | ORAL | Status: DC

## 2023-04-30 MED ORDER — ENOXAPARIN SODIUM 40 MG/0.4ML IJ SOSY
40.0000 mg | PREFILLED_SYRINGE | INTRAMUSCULAR | Status: DC
  Administered 2023-05-01 – 2023-05-03 (×3): 40 mg via SUBCUTANEOUS
  Filled 2023-04-30 (×3): qty 0.4

## 2023-04-30 MED ORDER — CLOPIDOGREL BISULFATE 75 MG PO TABS
75.0000 mg | ORAL_TABLET | Freq: Every day | ORAL | Status: DC
  Administered 2023-05-01 – 2023-05-07 (×7): 75 mg via ORAL
  Filled 2023-04-30 (×7): qty 1

## 2023-04-30 MED ORDER — NICOTINE 21 MG/24HR TD PT24
21.0000 mg | MEDICATED_PATCH | Freq: Every day | TRANSDERMAL | 0 refills | Status: DC | PRN
Start: 2023-04-30 — End: 2023-06-16
  Filled 2023-04-30: qty 28, 28d supply, fill #0

## 2023-04-30 MED ORDER — TRAMADOL HCL 50 MG PO TABS
50.0000 mg | ORAL_TABLET | Freq: Four times a day (QID) | ORAL | Status: DC | PRN
  Administered 2023-05-01 – 2023-05-02 (×4): 50 mg via ORAL
  Filled 2023-04-30 (×4): qty 1

## 2023-04-30 MED ORDER — DIPHENHYDRAMINE HCL 25 MG PO CAPS: 25.0000 mg | ORAL_CAPSULE | Freq: Four times a day (QID) | ORAL | Status: DC | PRN

## 2023-04-30 MED ORDER — LIDOCAINE 5 % EX PTCH
1.0000 | MEDICATED_PATCH | Freq: Every day | CUTANEOUS | 0 refills | Status: DC
Start: 2023-05-01 — End: 2023-05-05
  Filled 2023-04-30: qty 15, 15d supply, fill #0

## 2023-04-30 MED ORDER — SENNOSIDES-DOCUSATE SODIUM 8.6-50 MG PO TABS: 2.0000 | ORAL_TABLET | Freq: Every evening | ORAL | Status: DC | PRN

## 2023-04-30 MED ORDER — ALUM & MAG HYDROXIDE-SIMETH 200-200-20 MG/5ML PO SUSP: 30.0000 mL | ORAL | Status: DC | PRN

## 2023-04-30 MED ORDER — PROCHLORPERAZINE EDISYLATE 10 MG/2ML IJ SOLN: 5.0000 mg | Freq: Four times a day (QID) | INTRAMUSCULAR | Status: DC | PRN

## 2023-04-30 MED ORDER — LOSARTAN POTASSIUM 50 MG PO TABS
100.0000 mg | ORAL_TABLET | Freq: Every day | ORAL | Status: DC
  Administered 2023-05-01 – 2023-05-07 (×7): 100 mg via ORAL
  Filled 2023-04-30 (×8): qty 2

## 2023-04-30 MED ORDER — MANAGING BACK PAIN BOOK
Freq: Once | Status: AC
  Filled 2023-04-30: qty 1

## 2023-04-30 MED ORDER — METOPROLOL TARTRATE 25 MG PO TABS: 25.0000 mg | ORAL_TABLET | Freq: Two times a day (BID) | ORAL | Status: DC

## 2023-04-30 MED ORDER — ACETAMINOPHEN 325 MG PO TABS: 325.0000 mg | ORAL_TABLET | ORAL | Status: DC | PRN

## 2023-04-30 MED ORDER — CELECOXIB 100 MG PO CAPS: 100.0000 mg | ORAL_CAPSULE | Freq: Two times a day (BID) | ORAL | Status: DC | PRN

## 2023-04-30 MED ORDER — GUAIFENESIN-DM 100-10 MG/5ML PO SYRP: 5.0000 mL | ORAL_SOLUTION | Freq: Four times a day (QID) | ORAL | Status: DC | PRN

## 2023-04-30 MED ORDER — ROSUVASTATIN CALCIUM 40 MG PO TABS: 40.0000 mg | ORAL_TABLET | Freq: Every day | ORAL | Status: DC

## 2023-04-30 MED ORDER — TRAZODONE HCL 50 MG PO TABS
25.0000 mg | ORAL_TABLET | Freq: Every evening | ORAL | Status: DC | PRN
  Administered 2023-05-05 – 2023-05-06 (×3): 50 mg via ORAL
  Filled 2023-04-30 (×3): qty 1

## 2023-04-30 MED ORDER — METOPROLOL TARTRATE 25 MG PO TABS
25.0000 mg | ORAL_TABLET | Freq: Two times a day (BID) | ORAL | Status: DC
  Administered 2023-04-30 – 2023-05-05 (×10): 25 mg via ORAL
  Filled 2023-04-30 (×10): qty 1

## 2023-04-30 MED ORDER — FLEET ENEMA RE ENEM: 1.0000 | ENEMA | Freq: Once | RECTAL | Status: DC | PRN

## 2023-04-30 MED ORDER — LIDOCAINE 5 % EX PTCH
1.0000 | MEDICATED_PATCH | Freq: Every day | CUTANEOUS | Status: DC
  Administered 2023-05-01 – 2023-05-07 (×7): 1 via TRANSDERMAL
  Filled 2023-04-30 (×7): qty 1

## 2023-04-30 MED ORDER — BLOOD PRESSURE CONTROL BOOK
Freq: Once | Status: AC
  Filled 2023-04-30: qty 1

## 2023-04-30 MED ORDER — HYDRALAZINE HCL 10 MG PO TABS: 10.0000 mg | ORAL_TABLET | Freq: Four times a day (QID) | ORAL | Status: DC | PRN

## 2023-04-30 MED ORDER — ROSUVASTATIN CALCIUM 20 MG PO TABS
40.0000 mg | ORAL_TABLET | Freq: Every day | ORAL | Status: DC
  Administered 2023-05-01 – 2023-05-07 (×7): 40 mg via ORAL
  Filled 2023-04-30 (×7): qty 2

## 2023-04-30 MED ORDER — PROCHLORPERAZINE 25 MG RE SUPP: 12.5000 mg | Freq: Four times a day (QID) | RECTAL | Status: DC | PRN

## 2023-04-30 MED ORDER — BISACODYL 10 MG RE SUPP: 10.0000 mg | Freq: Every day | RECTAL | Status: DC | PRN

## 2023-04-30 MED ORDER — PANTOPRAZOLE SODIUM 40 MG PO TBEC
40.0000 mg | DELAYED_RELEASE_TABLET | Freq: Every day | ORAL | Status: DC
  Administered 2023-04-30 – 2023-05-06 (×7): 40 mg via ORAL
  Filled 2023-04-30 (×7): qty 1

## 2023-04-30 MED ORDER — HYDRALAZINE HCL 10 MG PO TABS
10.0000 mg | ORAL_TABLET | Freq: Four times a day (QID) | ORAL | Status: DC | PRN
  Administered 2023-05-01 – 2023-05-02 (×2): 10 mg via ORAL
  Filled 2023-04-30 (×2): qty 1

## 2023-04-30 MED ORDER — NICOTINE 21 MG/24HR TD PT24: 21.0000 mg | MEDICATED_PATCH | Freq: Every day | TRANSDERMAL | Status: DC | PRN

## 2023-04-30 MED ORDER — AMLODIPINE BESYLATE 10 MG PO TABS
10.0000 mg | ORAL_TABLET | Freq: Every day | ORAL | Status: DC
  Administered 2023-05-01 – 2023-05-07 (×7): 10 mg via ORAL
  Filled 2023-04-30 (×7): qty 1

## 2023-04-30 MED ORDER — ACETAMINOPHEN 325 MG PO TABS
650.0000 mg | ORAL_TABLET | Freq: Four times a day (QID) | ORAL | 0 refills | Status: DC | PRN
  Filled 2023-04-30: qty 100, 13d supply, fill #0

## 2023-04-30 MED ORDER — LOSARTAN POTASSIUM 100 MG PO TABS: 100.0000 mg | ORAL_TABLET | Freq: Every day | ORAL | Status: DC

## 2023-04-30 MED ORDER — PROCHLORPERAZINE MALEATE 5 MG PO TABS: 5.0000 mg | ORAL_TABLET | Freq: Four times a day (QID) | ORAL | Status: DC | PRN

## 2023-04-30 MED ORDER — ASPIRIN 81 MG PO TBEC
81.0000 mg | DELAYED_RELEASE_TABLET | Freq: Every day | ORAL | Status: DC
  Administered 2023-05-01 – 2023-05-07 (×7): 81 mg via ORAL
  Filled 2023-04-30 (×7): qty 1

## 2023-04-30 MED ORDER — AMLODIPINE BESYLATE 10 MG PO TABS: 10.0000 mg | ORAL_TABLET | Freq: Every day | ORAL | Status: DC

## 2023-04-30 MED ORDER — ASPIRIN 81 MG PO TBEC
81.0000 mg | DELAYED_RELEASE_TABLET | Freq: Every day | ORAL | 12 refills | Status: DC
  Filled 2023-04-30: qty 120, 120d supply, fill #0

## 2023-04-30 NOTE — Progress Notes (Signed)
Patient ID: Lance Santiago, male   DOB: 12-02-1960, 62 y.o.   MRN: [id number] Pt arrived per bed to 4W21. Pt alert and oriented x4. Rehab policies and safety precautions reviewed. Pt in agreement. Pt put in chair for comfort. Safety belt on. Call light in reach. Assessments completed and vitals obtained.  Mylo Red, LPN

## 2023-04-30 NOTE — H&P (Signed)
Physical Medicine and Rehabilitation Admission H&P    Chief Complaint  Patient presents with   Stroke with functional due to stroke    HPI:  Lance Santiago is a 62 year old male with history of CAD, HTN, Systolic CHF, tobacco use- I PPD who was admitted on 04/26/23 with right sided weakness and coolness RLE. CTA head was negative for LVO and showed severe stenosis distal R-P3 segment and left P1 segment, tiny 2 mm aneurysm and carotid bifurcation showed 70% stenosis proximal R-ICA and 50% stenosis proximal L-ICA. 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 UDS positive for THC.  MRI brain showed L-ACA territory infarct. 2D echo showed EF 50-55% with now all abnormality and no shunt.  Patient noted to have RUE/RLE weakness which is resolving.  Elevated BP treated with titration of meds. Dr. Roda Shutters felt that stroke likely due to small vessel disease and recs for DAPT X 3 months followed by ASA indefinitely. Dr. Randie Heinz consulted for input and recommended outpatient follow up on PAD as patient with palpable right PT pulse and able to ambulate 2 blocks prior to symptoms of claudication. Carotid  dopplers ordered  showing 40-59% R-ICA stenosis and 1-39% L-ICA stenosis. Patient was educated on importance of smoking cessation by vascular and to follow up in office for work up. Therapy has been working with patient who requires cues for BOS and RLE weakness with mobility,  assistance with mobility as well as min assist with ADLs.  CIR recommended due to functional decline.    Review of Systems  Constitutional:  Negative for fever.  Eyes:   Negative for pain.  Respiratory:  Negative for cough.   Cardiovascular:  Negative for chest pain and leg swelling.  Gastrointestinal:  Negative for abdominal pain and constipation.  Musculoskeletal:  Positive for back pain and myalgias.  Neurological:  Positive for sensory change and weakness.     Past Medical History:  Diagnosis Date   Coronary artery disease    Hyperlipidemia    Hypertension    Lumbar disc disease    Myocardial infarction (HCC)    9/18 PCI/DESx1 to RCA, EF 45%   Smoking    Systolic heart failure [facility])     Past Surgical History:  Procedure Laterality Date   CARDIAC CATHETERIZATION     CORONARY/GRAFT ACUTE MI REVASCULARIZATION N/A 04/25/2017   Procedure: Coronary/Graft Acute MI Revascularization;  Surgeon: Runell Gess, MD;  Location: [facility];  Service: Cardiovascular;  Laterality: N/A;   KNEE SURGERY     LEFT HEART CATH AND CORONARY ANGIOGRAPHY N/A 04/25/2017   Procedure: LEFT HEART CATH AND CORONARY ANGIOGRAPHY;  Surgeon: Runell Gess, MD;  Location: [facility];  Service: Cardiovascular;  Laterality: N/A;   LUMBAR LAMINECTOMY      Family History  Problem Relation Age of Onset   Diabetes Mother    Coronary artery disease Father     Social History:  reports that he has been smoking cigarettes. He has a 40 pack-year smoking history. He has never used smokeless tobacco. No history on file for alcohol use and drug use.   Allergies  Allergen Reactions   Penicillins Other (See Comments)  Causes sore throat Has patient had a PCN reaction causing immediate rash, facial/tongue/throat swelling, SOB or lightheadedness with hypotension: No Has patient had a PCN reaction causing severe rash involving mucus membranes or skin necrosis: No Has patient had a PCN reaction that required hospitalization: No Has patient had a PCN reaction occurring within the last 10 years: No If all of the above answers are "NO", then may proceed with  Cephalosporin use.    Medications Prior to Admission  Medication Sig Dispense Refill   esomeprazole (NEXIUM) 10 MG packet Take 10 mg by mouth daily before breakfast.     naproxen sodium (ALEVE) 220 MG tablet Take 220 mg by mouth in the morning and at bedtime.       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  Lives With: Significant other, Son   Functional History: Prior Function Prior Level of Function : Independent/Modified Independent, Driving Mobility Comments: indep ADLs Comments: indep, doesnt work  Functional Status:  Mobility: Bed Mobility Overal bed mobility: Needs Assistance Bed Mobility: Supine to Sit Supine to sit: Min assist, HOB elevated General bed mobility comments: Pt in recliner on arrival and at departure Transfers Overall transfer level: Needs assistance Equipment used: Rolling walker (2 wheels) Transfers: Sit to/from Stand Sit to Stand: Contact guard assist Bed to/from chair/wheelchair/BSC transfer type:: Stand pivot Stand pivot transfers: Contact guard assist General transfer comment: CGA verbal cues for safety with sequencing. Ambulation/Gait Ambulation/Gait assistance: Contact guard assist, Min assist Gait Distance (Feet): 150 Feet Assistive device: Rolling walker (2 wheels) Gait Pattern/deviations: Step-to pattern, Step-through pattern, Knee hyperextension - right, Decreased dorsiflexion - right, Narrow base of support General Gait Details: intermittent step to step through pattern. Pt  tends to forget to step fully with the RLE which leads to toe drag moderate steppage gait on the R. Verbal cues for stepping with the R foot toward the R leg on the RW w/improved BOS initially adduction at the RLE. Verbal cues for stepping and initial contact w/RLE to improve gait pattern. Gait velocity: decreased cadence Gait velocity interpretation: <1.8 ft/sec, indicate of risk for recurrent falls    ADL: ADL Overall ADL's : Needs assistance/impaired Eating/Feeding: Set up, Sitting Grooming: Supervision/safety, Sitting Grooming Details (indicate cue type and reason): at sink Upper Body Bathing: Minimal assistance, Sitting Upper Body Bathing Details (indicate cue type and reason): assistance for back Lower Body Bathing: Moderate assistance, Sit to/from stand Lower Body Bathing Details (indicate cue type and reason): able to bathe legs while seated requires assistance with periarea back while standing due to reliant on BUE support Upper Body Dressing : Supervision/safety, Sitting Upper Body Dressing Details (indicate cue type and reason): donn gown Lower Body Dressing: Moderate assistance, Sitting/lateral leans Lower Body Dressing Details (indicate cue type and reason): able to doff socks and donn left sock. assistance to maintain figure 4 with RLE and mod assist to donn sock Toilet Transfer: Minimal assistance, +2 for physical assistance, +2 for safety/equipment, Cueing for safety, Cueing for sequencing, Ambulation, BSC/3in1, Rolling walker (2 wheels) Toileting- Clothing Manipulation and Hygiene: Minimal assistance, Sitting/lateral lean Functional mobility during ADLs: Minimal assistance, +2 for physical assistance, +2  for safety/equipment, Rolling walker (2 wheels) General ADL Comments: increased use of RUE  Cognition: Cognition Overall Cognitive Status: Impaired/Different from baseline Arousal/Alertness: Awake/alert Orientation Level: Oriented X4 Attention: Focused Focused  Attention: Impaired Focused Attention Impairment: Verbal complex, Functional complex Memory: Impaired Memory Impairment: Decreased short term memory, Decreased recall of new information, Retrieval deficit Decreased Short Term Memory: Verbal complex, Functional complex Awareness: Appears intact Cognition Arousal: Alert Behavior During Therapy: WFL for tasks assessed/performed Overall Cognitive Status: Impaired/Different from baseline Area of Impairment: Safety/judgement Safety/Judgement: Decreased awareness of deficits, Decreased awareness of safety General Comments: cues for safety and sequencing   Blood pressure (!) 164/114, pulse 86, temperature 98.2 F (36.8 C), temperature source Oral, resp. rate 18, height 5\' 7"  (1.702 m), weight 69.7 kg, SpO2 97%. Physical Exam Vitals and nursing note reviewed.  Constitutional:      Appearance: Normal appearance.  HENT:     Head: Normocephalic.     Right Ear: External ear normal.     Left Ear: External ear normal.     Nose: Nose normal.     Mouth/Throat:     Comments: Missing numerous teeth Eyes:     Pupils: Pupils are equal, round, and reactive to light.  Cardiovascular:     Rate and Rhythm: Normal rate and regular rhythm.     Pulses: Normal pulses.     Heart sounds: No murmur heard.    No gallop.  Pulmonary:     Effort: Pulmonary effort is normal. No respiratory distress.     Breath sounds: Normal breath sounds.  Abdominal:     General: Bowel sounds are normal. There is no distension.     Palpations: Abdomen is soft.     Tenderness: There is no abdominal tenderness.  Musculoskeletal:        General: No swelling or deformity. Normal range of motion.     Cervical back: Normal range of motion.  Skin:    General: Skin is warm and dry.  Neurological:     Mental Status: He is alert and oriented to person, place, and time.     Comments: Alert and oriented x 3. Normal insight and awareness. Intact Memory. Normal language and speech sl  dysarthric. Cranial nerve exam unremarkable except for mild right central 7.. MMT: RUE 4 to 4+/5 prox to distal. LUE 5/5. RLE 2/5 HF, 1+ to 2- KE and 0/5 ADF/PF. LLE 5/5. Sensory exam normal for light touch and pain in all 4 limbs. No limb ataxia or cerebellar signs. No abnormal tone appreciated.  Marland Kitchen      Psychiatric:        Mood and Affect: Mood normal.        Behavior: Behavior normal.     Results for orders placed or performed during the hospital encounter of 04/26/23 (from the past 48 hour(s))  Basic metabolic panel     Status: Abnormal   Collection Time: 04/29/23  7:53 AM  Result Value Ref Range   Sodium 135 135 - 145 mmol/L   Potassium 4.2 3.5 - 5.1 mmol/L   Chloride 102 98 - 111 mmol/L   CO2 20 (L) 22 - 32 mmol/L   Glucose, Bld 109 (H) 70 - 99 mg/dL    Comment: Glucose reference range applies only to samples taken after fasting for at least 8 hours.   BUN 21 (H) 6 - 20 mg/dL   Creatinine, Ser 7.42 0.61 - 1.24 mg/dL   Calcium 9.3 8.9 - 59.5 mg/dL   GFR, Estimated >  60 >60 mL/min    Comment: (NOTE) Calculated using the CKD-EPI Creatinine Equation (2021)    Anion gap 13 5 - 15    Comment: Performed at [facility], [address], [place], [place] [address]  CBC     Status: None   Collection Time: 04/29/23  7:53 AM  Result Value Ref Range   WBC 7.8 4.0 - 10.5 K/uL   RBC 5.54 4.22 - 5.81 MIL/uL   Hemoglobin 16.2 13.0 - 17.0 g/dL   HCT 19.1 47.8 - 29.5 %   MCV 90.8 80.0 - 100.0 fL   MCH 29.2 26.0 - 34.0 pg   MCHC 32.2 30.0 - 36.0 g/dL   RDW 62.1 30.8 - 65.7 %   Platelets 182 150 - 400 K/uL   nRBC 0.0 0.0 - 0.2 %    Comment: Performed at [facility], [address], [place], [place] [address]  Basic metabolic panel     Status: Abnormal   Collection Time: 04/30/23  6:18 AM  Result Value Ref Range   Sodium 136 135 - 145 mmol/L   Potassium 4.5 3.5 - 5.1 mmol/L   Chloride 100 98 - 111 mmol/L   CO2 26 22 - 32 mmol/L   Glucose, Bld 91 70 - 99 mg/dL    Comment:  Glucose reference range applies only to samples taken after fasting for at least 8 hours.   BUN 22 (H) 6 - 20 mg/dL   Creatinine, Ser 2.95 0.61 - 1.24 mg/dL   Calcium 9.5 8.9 - 28.4 mg/dL   GFR, Estimated >13 >24 mL/min    Comment: (NOTE) Calculated using the CKD-EPI Creatinine Equation (2021)    Anion gap 10 5 - 15    Comment: Performed at [facility], [address], [place], [place] [address]  CBC     Status: None   Collection Time: 04/30/23  6:18 AM  Result Value Ref Range   WBC 9.1 4.0 - 10.5 K/uL   RBC 5.70 4.22 - 5.81 MIL/uL   Hemoglobin 16.7 13.0 - 17.0 g/dL   HCT 72.5 36.6 - 44.0 %   MCV 90.0 80.0 - 100.0 fL   MCH 29.3 26.0 - 34.0 pg   MCHC 32.6 30.0 - 36.0 g/dL   RDW 34.7 42.5 - 95.6 %   Platelets 204 150 - 400 K/uL   nRBC 0.0 0.0 - 0.2 %    Comment: Performed at [facility], [address], [place], [place] [address]   No results found.    Blood pressure (!) 164/114, pulse 86, temperature 98.2 F (36.8 C), temperature source Oral, resp. rate 18, height 5\' 7"  (1.702 m), weight 69.7 kg, SpO2 97%.  Medical Problem List and Plan: 1. 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  Antithrombotics: -DVT/anticoagulation:  Pharmaceutical: Lovenox  -antiplatelet therapy: DAPT X 3 months followed by ASA alone.  3. Pain Management: Tylenol prn mild and ultram prn moderate pain  --continue Lidoderm patches for local measure.  4. Mood/Behavior/Sleep: LCSW to follow for evaluation and support.   -antipsychotic agents: N/A 5. Neuropsych/cognition: This patient is capable of making decisions on his own behalf. 6. Skin/Wound Care: Routine pressure relief measures.  7. Fluids/Electrolytes/Nutrition: Monitor I/O. Check CMET in am 8. HTN: Monitor BP TID  -BP poorly controlled at present  -add hydralazine for BP > 199/99  9. CAD: Monitor for symptoms  with increase in activity.  --daily wts. Monitor for signs of overload.   -- On Cozaar, metoprolol, Crestor,  10 Pre-renal azotemia: Encourage fluid intake.  11. Tobacco dependence: Continue nicotine patch.   --educate on importance of tobacco cessation 12. Aortoiliac disease w/ occluded Left CIA/infrainguinal dz:  NO rest pain. Continue ASA and Lipitor.   --smoking cessation.  --follow up with Dr. Randie Heinz after discharge.       Jacquelynn Cree, PA-C 04/30/2023

## 2023-04-30 NOTE — Discharge Instructions (Signed)
Dear Mr. Bergsten,  I enjoyed making your acquaintance throughout your stay.  You were diagnosed with a stroke and given a clot-busting medication.  While you were here, we tried to control your blood pressure and help you regain function in your right side.  I am happy to see the progress you have already made, and anticipate further progress in acute inpatient rehabilitation.  Please follow these instructions:  1: Please take your medications regularly, as instructed.    2: The aspirin and Plavix are medications that will help prevent further strokes. The losartan, metoprolol, and amlodipine are medications for her blood pressure -- it is very important that you take these regularly, as a persistently high blood pressure can lead to brain bleeds and other poor outcomes.  3: I have discharged you on nicotine patches if you would like to use them.  However you do it, it is vitally important that you stop smoking cigarettes.  Despite having smoked most of your life, there are still real, significant health benefits to quitting tobacco now and never returning to it.  I have confidence that he will be able to do this, as you have done it in the past for a significant period of time.  4: Please engage with physical therapy as much as possible -- I am optimistic for the recovery of function in your right leg, and believe that the more you work, the faster you will recover.  Again, it was very nice to meet you.  If we meet again in the future, I hope it is under very different circumstances.  All my best,   Luiz Iron, MD Resident physician, PGY 1

## 2023-04-30 NOTE — Progress Notes (Signed)
Lance Oyster, MD  Physician Physical Medicine and Rehabilitation   PMR Pre-admission     Signed   Date of Service: 04/30/2023  9:53 AM  Related encounter: ED to Hosp-Admission (Current) from 04/26/2023 in [facility]   Signed     Expand All Collapse All  PMR Admission Coordinator Pre-Admission Assessment   Patient: Lance Santiago is an 62 y.o., male MRN: [id number] DOB: 1961-06-09 Height: 5\' 7"  (170.2 cm) Weight: 69.7 kg   Insurance Information HMO:     PPO:      PCP:      IPA:      80/20:     OTHER:  PRIMARY: uninsured, provided estimate 04/28/23      Policy#:       Subscriber:  CM Name:       Phone#:      Fax#:  Pre-Cert#:       Employer:  Benefits:  Phone #:      Name:  Eff. Date:      Deduct:       Out of Pocket Max:       Life Max:  CIR:       SNF:  Outpatient:      Co-Pay:  Home Health:       Co-Pay:  DME:      Co-Pay:  Providers:  SECONDARY:       Policy#:      Phone#:    Artist:       Phone#:    The Data processing manager" for patients in Inpatient Rehabilitation Facilities with attached "Privacy Act Statement-Health Care Records" was provided and verbally reviewed with: Patient and Family   Emergency Contact Information Contact Information       Name Relation Home Work Mobile    Lance Santiago,Lance Santiago Relative [phone]             Other Contacts   None on File        Current Medical History  Patient Admitting Diagnosis: CVA    History of Present Illness: Pt is a 62 y/o male with PMH of HTN, CAD s/p stenting, lumbar laminectomy, and SHF admitted to [facility] on 04/26/23 with c/o right sided weakness of sudden onset.  In ED BP 151/93, potassium 5.4, RLE cold and without pulses.  NIHSS 6.  CT head showed L posterior frontal, R cerebellar, and R posterior temporal lobe encephalomalacia consistent with chronic infarcts.  CTA showed R and L ICA stenosis.  CT angio negative for dissection.  MRI showed  developing acute L ACA stroke and he was given TNK.  Echo showed EF 50-55%,  Hgb A1C 6.0.  Pt did require Cleviprex infusion transition to cardene drip due to triglycerides.  BP goal <180/105.  Therapy evaluations completed and pt was recommended for CIR.    Complete NIHSS TOTAL: 3   Patient's medical record from [name] has been reviewed by the rehabilitation admission coordinator and physician.   Past Medical History      Past Medical History:  Diagnosis Date   Coronary artery disease     Hyperlipidemia     Hypertension     Lumbar disc disease     Myocardial infarction (HCC)      9/18 PCI/DESx1 to RCA, EF 45%   Smoking     Systolic heart failure (HCC)            Has the patient had major surgery during 100 days  prior to admission? Yes   Family History   family history includes Coronary artery disease in his father; Diabetes in his mother.   Current Medications  Current Medications    Current Facility-Administered Medications:    acetaminophen (TYLENOL) tablet 650 mg, 650 mg, Oral, Q4H PRN, 650 mg at 04/27/23 1759 **OR** acetaminophen (TYLENOL) 160 MG/5ML solution 650 mg, 650 mg, Per Tube, Q4H PRN **OR** acetaminophen (TYLENOL) suppository 650 mg, 650 mg, Rectal, Q4H PRN, Erick Blinks, MD   amLODipine (NORVASC) tablet 10 mg, 10 mg, Oral, Daily, Marvel Plan, MD, 10 mg at 04/30/23 8469   aspirin EC tablet 81 mg, 81 mg, Oral, Daily, Caryl Pina, MD, 81 mg at 04/30/23 0916   Chlorhexidine Gluconate Cloth 2 % PADS 6 each, 6 each, Topical, Q2000, Erick Blinks, MD, 6 each at 04/28/23 2100   clopidogrel (PLAVIX) tablet 75 mg, 75 mg, Oral, Daily, Marvel Plan, MD, 75 mg at 04/30/23 0916   enoxaparin (LOVENOX) injection 40 mg, 40 mg, Subcutaneous, Q24H, Marvel Plan, MD, 40 mg at 04/30/23 0916   hydrALAZINE (APRESOLINE) injection 10 mg, 10 mg, Intravenous, Q4H PRN, de Saintclair Halsted, Cortney E, NP, 10 mg at 04/29/23 2141   labetalol (NORMODYNE) injection 10 mg, 10 mg,  Intravenous, Q2H PRN, de Saintclair Halsted, Cortney E, NP   lidocaine (LIDODERM) 5 % 1 patch, 1 patch, Transdermal, Daily, Tomie China, MD, 1 patch at 04/30/23 0916   losartan (COZAAR) tablet 100 mg, 100 mg, Oral, Daily, de Saintclair Halsted, Florala E, NP, 100 mg at 04/30/23 0916   metoprolol tartrate (LOPRESSOR) tablet 25 mg, 25 mg, Oral, BID, Marvel Plan, MD, 25 mg at 04/30/23 0916   nicotine (NICODERM CQ - dosed in mg/24 hours) patch 21 mg, 21 mg, Transdermal, Daily PRN, Erick Blinks, MD, 21 mg at 04/29/23 1833   Oral care mouth rinse, 15 mL, Mouth Rinse, PRN, Erick Blinks, MD   pantoprazole (PROTONIX) EC tablet 40 mg, 40 mg, Oral, QHS, Marvel Plan, MD, 40 mg at 04/29/23 2056   rosuvastatin (CRESTOR) tablet 40 mg, 40 mg, Oral, Daily, Tomie China, MD, 40 mg at 04/30/23 0916   senna-docusate (Senokot-S) tablet 1 tablet, 1 tablet, Oral, QHS PRN, Erick Blinks, MD   traMADol Janean Sark) tablet 50 mg, 50 mg, Oral, Q6H PRN, Tomie China, MD, 50 mg at 04/30/23 6295     Patients Current Diet:  Diet Order                  Diet regular Room service appropriate? Yes; Fluid consistency: Thin  Diet effective now                         Precautions / Restrictions Precautions Precautions: Fall, Other (comment) Precaution Comments: BP <180/105 Restrictions Weight Bearing Restrictions: No    Has the patient had 2 or more falls or a fall with injury in the past year? No   Prior Activity Level Community (5-7x/wk): indep without device, driving, not working   Prior Functional Level Self Care: Did the patient need help bathing, dressing, using the toilet or eating? Independent   Indoor Mobility: Did the patient need assistance with walking from room to room (with or without device)? Independent   Stairs: Did the patient need assistance with internal or external stairs (with or without device)? Independent   Functional Cognition: Did the patient need help planning regular  tasks such as shopping or remembering to take medications? Independent   Patient Information Are you of  Hispanic, Latino/a,or Spanish origin?: A. No, not of Hispanic, Latino/a, or Spanish origin What is your race?: A. White Do you need or want an interpreter to communicate with a doctor or health care staff?: 0. No   Patient's Response To:  Health Literacy and Transportation Is the patient able to respond to health literacy and transportation needs?: Yes Health Literacy - How often do you need to have someone help you when you read instructions, pamphlets, or other written material from your doctor or pharmacy?: Never In the past 12 months, has lack of transportation kept you from medical appointments or from getting medications?: No In the past 12 months, has lack of transportation kept you from meetings, work, or from getting things needed for daily living?: No   Journalist, newspaper / Equipment Home Assistive Devices/Equipment: None Home Equipment: None   Prior Device Use: Indicate devices/aids used by the patient prior to current illness, exacerbation or injury? None of the above   Current Functional Level Cognition   Arousal/Alertness: Awake/alert Overall Cognitive Status: Impaired/Different from baseline Orientation Level: Oriented X4 Safety/Judgement: Decreased awareness of deficits, Decreased awareness of safety General Comments: cues for safety and sequencing Attention: Focused Focused Attention: Impaired Focused Attention Impairment: Verbal complex, Functional complex Memory: Impaired Memory Impairment: Decreased short term memory, Decreased recall of new information, Retrieval deficit Decreased Short Term Memory: Verbal complex, Functional complex Awareness: Appears intact    Extremity Assessment (includes Sensation/Coordination)   Upper Extremity Assessment: Right hand dominant, RUE deficits/detail RUE Deficits / Details: ROM with WFL with the exception of thumb to  pinky opposition. Sensation is WFL, proprioception is impaired. Slowed and deliberate coordination. Strength is grossly 4/5 RUE Sensation: decreased proprioception RUE Coordination: decreased fine motor, decreased gross motor  Lower Extremity Assessment: Defer to PT evaluation RLE Deficits / Details: pt able to perform hip flexion to lift leg in supine and limited ability to lift leg in standing, no AROM in sitting for hip flexion/knee flexion/ knee extension, pt reports normal sensation     ADLs   Overall ADL's : Needs assistance/impaired Eating/Feeding: Set up, Sitting Grooming: Supervision/safety, Sitting Grooming Details (indicate cue type and reason): at sink Upper Body Bathing: Minimal assistance, Sitting Upper Body Bathing Details (indicate cue type and reason): assistance for back Lower Body Bathing: Moderate assistance, Sit to/from stand Lower Body Bathing Details (indicate cue type and reason): able to bathe legs while seated requires assistance with periarea back while standing due to reliant on BUE support Upper Body Dressing : Supervision/safety, Sitting Upper Body Dressing Details (indicate cue type and reason): donn gown Lower Body Dressing: Moderate assistance, Sitting/lateral leans Lower Body Dressing Details (indicate cue type and reason): able to doff socks and donn left sock. assistance to maintain figure 4 with RLE and mod assist to donn sock Toilet Transfer: Minimal assistance, +2 for physical assistance, +2 for safety/equipment, Cueing for safety, Cueing for sequencing, Ambulation, BSC/3in1, Rolling walker (2 wheels) Toileting- Clothing Manipulation and Hygiene: Minimal assistance, Sitting/lateral lean Functional mobility during ADLs: Minimal assistance, +2 for physical assistance, +2 for safety/equipment, Rolling walker (2 wheels) General ADL Comments: increased use of RUE     Mobility   Overal bed mobility: Needs Assistance Bed Mobility: Supine to Sit Supine to  sit: Min assist, HOB elevated General bed mobility comments: up in chair on arrival     Transfers   Overall transfer level: Needs assistance Equipment used: Rolling walker (2 wheels) Transfers: Sit to/from Stand, Bed to chair/wheelchair/BSC Sit to Stand: Min assist  Bed to/from chair/wheelchair/BSC transfer type:: Stand pivot Stand pivot transfers: Contact guard assist General transfer comment: min assist for transfers     Ambulation / Gait / Stairs / Wheelchair Mobility   Ambulation/Gait Ambulation/Gait assistance: Min Chemical engineer (Feet): 12 Feet Assistive device: Rolling walker (2 wheels) Gait Pattern/deviations: Step-to pattern General Gait Details: pt with assist to advance RLE and direct/control RW with stepping. short gait in room with ditance limited by elevated BP up to 182/109 Gait velocity interpretation: <1.31 ft/sec, indicative of household ambulator     Posture / Balance Balance Overall balance assessment: Needs assistance Sitting-balance support: No upper extremity supported, Feet supported Sitting balance-Leahy Scale: Good Standing balance support: Bilateral upper extremity supported Standing balance-Leahy Scale: Poor Standing balance comment: reliant on BUE support when standing     Special needs/care consideration Diabetic management yes    Previous Home Environment (from acute therapy documentation) Living Arrangements: Spouse/significant other  Lives With: Significant other, Son Available Help at Discharge: Family, Available PRN/intermittently Type of Home: House Home Layout: One level Home Access: Stairs to enter Secretary/administrator of Steps: 2 Bathroom Shower/Tub: Engineer, manufacturing systems: Standard Home Care Services: No Additional Comments: girlfriend works 3rd shift, son works 1st shift and 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   Discharge Living Setting Plans for Discharge Living Setting: Patient's home, Lives with  (comment) (significant other Lance Santiago), son (Lance Santiago) and Lance Santiago's GF) Type of Home at Discharge: House Discharge Home Layout: One level Discharge Home Access: Stairs to enter Entrance Stairs-Rails: None Entrance Stairs-Number of Steps: 2 Discharge Bathroom Shower/Tub: Tub/shower unit Discharge Bathroom Toilet: Standard Discharge Bathroom Accessibility: Yes How Accessible: Accessible via walker Does the patient have any problems obtaining your medications?: Yes (Describe)   Social/Family/Support Systems Anticipated Caregiver: Lance Santiago (s/o) and Cassian (son) Anticipated Caregiver's Contact Information: Lance Santiago [phone] Ability/Limitations of Caregiver: supervision Caregiver Availability: 24/7 Discharge Plan Discussed with Primary Caregiver: Yes Is Caregiver In Agreement with Plan?: Yes Does Caregiver/Family have Issues with Lodging/Transportation while Pt is in Rehab?: No   Goals Patient/Family Goal for Rehab: PT/OT/SLP supervision Expected length of stay: 10-12 days Additional Information: Discharge plan: return to pt's home with s/o, son, and son's s/o providing 24/7 supervision Pt/Family Agrees to Admission and willing to participate: Yes Program Orientation Provided & Reviewed with Pt/Caregiver Including Roles  & Responsibilities: Yes  Barriers to Discharge: Insurance for SNF coverage   Decrease burden of Care through IP rehab admission: n/a   Possible need for SNF placement upon discharge:  Not anticipated.  Plan for d/c home with significant other, Lance Santiago, and his son, Filipe, providing 24/7 supervision.  Davian's girlfriend is also present in the home.    Patient Condition: I have reviewed medical records from [facility], spoken with  [facility] team , and patient. I met with patient at the bedside for inpatient rehabilitation assessment.  Patient will benefit from ongoing PT, OT, and SLP, can actively participate in 3 hours of therapy a day 5 days of the week, and can make measurable  gains during the admission.  Patient will also benefit from the coordinated team approach during an Inpatient Acute Rehabilitation admission.  The patient will receive intensive therapy as well as Rehabilitation physician, nursing, social worker, and care management interventions.  Due to safety, skin/wound care, disease management, medication administration, pain management, and patient education the patient requires 24 hour a day rehabilitation nursing.  The patient is currently min asisst with mobility and basic ADLs.  Discharge setting and therapy  post discharge at home with home health is anticipated.  Patient has agreed to participate in the Acute Inpatient Rehabilitation Program and will admit today.   Preadmission Screen Completed By:  Stephania Fragmin, PT, DPT 04/30/2023 9:52 AM ______________________________________________________________________   Discussed status with Dr. Riley Kill on 04/30/23  at 9:52 AM  and received approval for admission today.   Admission Coordinator:  Stephania Fragmin, PT, DPT time 9:52 AM Dorna Bloom 04/30/23     Assessment/Plan: Diagnosis: CVA Does the need for close, 24 hr/day Medical supervision in concert with the patient's rehab needs make it unreasonable for this patient to be served in a less intensive setting? Yes Co-Morbidities requiring supervision/potential complications: HTN, CAD, lumbar DDD, CHF Due to bladder management, bowel management, safety, skin/wound care, disease management, medication administration, pain management, and patient education, does the patient require 24 hr/day rehab nursing? Yes Does the patient require coordinated care of a physician, rehab nurse, PT, OT, and SLP to address physical and functional deficits in the context of the above medical diagnosis(es)? Yes Addressing deficits in the following areas: balance, endurance, locomotion, strength, transferring, bowel/bladder control, bathing, dressing, feeding, grooming, toileting,  cognition, speech, and psychosocial support Can the patient actively participate in an intensive therapy program of at least 3 hrs of therapy 5 days a week? Yes The potential for patient to make measurable gains while on inpatient rehab is excellent Anticipated functional outcomes upon discharge from inpatient rehab: supervision PT, supervision OT, supervision SLP Estimated rehab length of stay to reach the above functional goals is: 10-12 days Anticipated discharge destination: Home 10. Overall Rehab/Functional Prognosis: excellent     MD Signature: Lance Oyster, MD, [facility] Physical Medicine & Rehabilitation Medical Director Rehabilitation Services 04/30/2023           Revision History

## 2023-04-30 NOTE — Progress Notes (Signed)
Physical Therapy Treatment Patient Details Name: Lance Santiago MRN: [id number] DOB: [birth year] Today's Date: 04/30/2023   History of Present Illness 62 y.o. male admitted 9/16 with Rt weakness with MRI showing acute left ACA infarct s/p TNK. Pt also with iiac artery occlusion Lt>Rt. PMhx: CAD s/p stent, HLD, HTN, tobacco abuse, lumbar laminectomy, and systolic heart failure.    PT Comments  Pt is progressing towards goals. Pt currently is CGA for sit to stand for stability and Min A for gait due to poor sequencing and R toe drag. Due to current functional status, home set up and available assistance at home recommending skilled physical therapy services >3 hours/day in order to address strength, balance and functional mobility to decrease risk for falls, injury and re-hospitalization. Pt tolerated treatment session well.    If plan is discharge home, recommend the following: A lot of help with walking and/or transfers;A lot of help with bathing/dressing/bathroom;Assistance with cooking/housework;Assist for transportation;Help with stairs or ramp for entrance;Assistance with feeding     Equipment Recommendations  Rolling walker (2 wheels);BSC/3in1       Precautions / Restrictions Precautions Precautions: Fall;Other (comment) Precaution Comments: BP <180/105 Restrictions Weight Bearing Restrictions: No     Mobility  Bed Mobility   General bed mobility comments: Pt in recliner on arrival and at departure    Transfers Overall transfer level: Needs assistance Equipment used: Rolling walker (2 wheels) Transfers: Sit to/from Stand Sit to Stand: Contact guard assist           General transfer comment: CGA verbal cues for safety with sequencing.    Ambulation/Gait Ambulation/Gait assistance: Contact guard assist, Min assist Gait Distance (Feet): 150 Feet Assistive device: Rolling walker (2 wheels) Gait Pattern/deviations: Step-to pattern, Step-through pattern, Knee  hyperextension - right, Decreased dorsiflexion - right, Narrow base of support Gait velocity: decreased cadence Gait velocity interpretation: <1.8 ft/sec, indicate of risk for recurrent falls   General Gait Details: intermittent step to step through pattern. Pt tends to forget to step fully with the RLE which leads to toe drag moderate steppage gait on the R. Verbal cues for stepping with the R foot toward the R leg on the RW w/improved BOS initially adduction at the RLE. Verbal cues for stepping and initial contact w/RLE to improve gait pattern.     Modified Rankin (Stroke Patients Only) Modified Rankin (Stroke Patients Only) Pre-Morbid Rankin Score: No symptoms Modified Rankin: Moderate disability     Balance Overall balance assessment: Needs assistance Sitting-balance support: No upper extremity supported, Feet supported Sitting balance-Leahy Scale: Good     Standing balance support: Bilateral upper extremity supported Standing balance-Leahy Scale: Fair Standing balance comment: Reliant on UE support in standing.        Cognition Arousal: Alert Behavior During Therapy: WFL for tasks assessed/performed Overall Cognitive Status: Impaired/Different from baseline Area of Impairment: Safety/judgement       Safety/Judgement: Decreased awareness of deficits, Decreased awareness of safety     General Comments: cues for safety and sequencing           General Comments General comments (skin integrity, edema, etc.): Pt states he wants to get home to his cat Sonya      Pertinent Vitals/Pain Pain Assessment Pain Assessment: No/denies pain     PT Goals (current goals can now be found in the care plan section) Acute Rehab PT Goals Patient Stated Goal: return home to  my cat PT Goal Formulation: With patient Time For Goal Achievement: 05/11/23 Potential to  Achieve Goals: Good Progress towards PT goals: Progressing toward goals    Frequency    Min 1X/week       PT Plan  Continue with current POC       AM-PAC PT "6 Clicks" Mobility   Outcome Measure  Help needed turning from your back to your side while in a flat bed without using bedrails?: A Little Help needed moving from lying on your back to sitting on the side of a flat bed without using bedrails?: A Little Help needed moving to and from a bed to a chair (including a wheelchair)?: A Little Help needed standing up from a chair using your arms (e.g., wheelchair or bedside chair)?: A Little Help needed to walk in hospital room?: A Little Help needed climbing 3-5 steps with a railing? : A Lot 6 Click Score: 17    End of Session Equipment Utilized During Treatment: Gait belt Activity Tolerance: Patient tolerated treatment well Patient left: in chair;with call bell/phone within reach;with chair alarm set Nurse Communication: Mobility status PT Visit Diagnosis: Other abnormalities of gait and mobility (R26.89);Muscle weakness (generalized) (M62.81);Unsteadiness on feet (R26.81)     Time: 5284-1324 PT Time Calculation (min) (ACUTE ONLY): 27 min  Charges:    $Gait Training: 8-22 mins $Therapeutic Activity: 8-22 mins PT General Charges $$ ACUTE PT VISIT: 1 Visit                     Harrel Carina, DPT, CLT  Acute Rehabilitation Services Office: [phone] (Secure chat preferred)    Claudia Desanctis 04/30/2023, 2:44 PM

## 2023-04-30 NOTE — Progress Notes (Signed)
Orthopedic Tech Progress Note Patient Details:  Lance Santiago 1960-08-18 [id number]  Ortho Devices Type of Ortho Device: Prafo boot/shoe Ortho Device/Splint Location: RLE only Ortho Device/Splint Interventions: Ordered, Application   Post Interventions Patient Tolerated: Well  Tonye Pearson 04/30/2023, 6:19 PM

## 2023-04-30 NOTE — Progress Notes (Signed)
Inpatient Rehabilitation Admission Medication Review by a Pharmacist  A complete drug regimen review was completed for this patient to identify any potential clinically significant medication issues.  High Risk Drug Classes Is patient taking? Indication by Medication  Antipsychotic Yes, as an intravenous medication Prochlorperazine - nausea  Anticoagulant Yes Enoxaparin - VTE proph  Antibiotic No   Opioid Yes Tramadol - pain   Antiplatelet Yes Aspirin, clopidogrel - stroke proph  Hypoglycemics/insulin No   Vasoactive Medication Yes Amlodipine, losartan, metoprolol, hydralazine - HTN  Chemotherapy No   Other Yes Lidocaine patch - back pain Pantoprazole - GERD Rosuvastatin - HLD Diphenhydramine - itching Robitussin DM - cough Nicotine patch - smoking cessation  Trazodone - sleep     Type of Medication Issue Identified Description of Issue Recommendation(s)  Drug Interaction(s) (clinically significant)     Duplicate Therapy     Allergy     No Medication Administration End Date  bASA + clopidogrel for 3 months then ASA alone Clopidogrel stopped date of 07/25/23 placed  Incorrect Dose     Additional Drug Therapy Needed     Significant med changes from prior encounter (inform family/care partners about these prior to discharge). PTA meds not resumed: Esomeprazole Naproxen  Stop esomeprazole due to DDI with clopidogrel - continue pantoprazole on discharge if needed Stroke team ordered celecoxib prn pain - consider resuming on discharge  Other       Clinically significant medication issues were identified that warrant physician communication and completion of prescribed/recommended actions by midnight of the next day:  No   Pharmacist comments: consider resuming celecoxib on discharge  Time spent performing this drug regimen review (minutes):  20  Loura Back, PharmD, BCPS 5:51 PM

## 2023-04-30 NOTE — H&P (Addendum)
Physical Medicine and Rehabilitation Admission H&P        Chief Complaint  Patient presents with   Stroke with functional due to stroke      HPI:  Lance Santiago is a 62 year old male with history of CAD, HTN, Systolic CHF, tobacco use- I PPD who was admitted on 04/26/23 with right sided weakness and coolness RLE. CTA head was negative for LVO and showed severe stenosis distal R-P3 segment and left P1 segment, tiny 2 mm aneurysm and carotid bifurcation showed 70% stenosis proximal R-ICA and 50% stenosis proximal L-ICA. 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 UDS positive for THC.  MRI brain showed L-ACA territory infarct. 2D echo showed EF 50-55% with now all abnormality and no shunt.  Patient noted to have RUE/RLE weakness which is resolving.   Elevated BP treated with titration of meds. Dr. Roda Shutters felt that stroke likely due to small vessel disease and recs for DAPT X 3 months followed by ASA indefinitely. Dr. Randie Heinz consulted for input and recommended outpatient follow up on PAD as patient with palpable right PT pulse and able to ambulate 2 blocks prior to symptoms of claudication. Carotid  dopplers ordered  showing 40-59% R-ICA stenosis and 1-39% L-ICA stenosis. Patient was educated on importance of smoking cessation by vascular and to follow up in office for work up. Therapy has been working with patient who requires cues for BOS and RLE weakness with mobility,  assistance with mobility as well as min assist with ADLs.  CIR recommended due to functional decline.      Review of Systems  Constitutional:  Negative for fever.   Eyes:  Negative for pain.  Respiratory:  Negative for cough.   Cardiovascular:  Negative for chest pain and leg swelling.  Gastrointestinal:  Negative for abdominal pain and constipation.  Musculoskeletal:  Positive for back pain and myalgias.  Neurological:  Positive for sensory change and weakness.            Past Medical History:  Diagnosis Date   Coronary artery disease     Hyperlipidemia     Hypertension     Lumbar disc disease     Myocardial infarction (HCC)      9/18 PCI/DESx1 to RCA, EF 45%   Smoking     Systolic heart failure [facility])                 Past Surgical History:  Procedure Laterality Date   CARDIAC CATHETERIZATION       CORONARY/GRAFT ACUTE MI REVASCULARIZATION N/A 04/25/2017    Procedure: Coronary/Graft Acute MI Revascularization;  Surgeon: Runell Gess, MD;  Location: [facility] INVASIVE CV LAB;  Service: Cardiovascular;  Laterality: N/A;   KNEE SURGERY       LEFT HEART CATH AND CORONARY ANGIOGRAPHY N/A 04/25/2017    Procedure: LEFT HEART CATH AND CORONARY ANGIOGRAPHY;  Surgeon: Runell Gess, MD;  Location: [facility] INVASIVE CV LAB;  Service: Cardiovascular;  Laterality: N/A;   LUMBAR LAMINECTOMY                   Family History  Problem Relation Age of Onset   Diabetes Mother     Coronary  artery disease Father            Social History:  reports that he has been smoking cigarettes. He has a 40 pack-year smoking history. He has never used smokeless tobacco. No history on file for alcohol use and drug use.     Allergies       Allergies  Allergen Reactions   Penicillins Other (See Comments)      Causes sore throat Has patient had a PCN reaction causing immediate rash, facial/tongue/throat swelling, SOB or lightheadedness with hypotension: No Has patient had a PCN reaction causing severe rash involving mucus membranes or skin necrosis: No Has patient had a PCN reaction that required hospitalization: No Has patient had a PCN reaction occurring  within the last 10 years: No If all of the above answers are "NO", then may proceed with Cephalosporin use.              Medications Prior to Admission  Medication Sig Dispense Refill   esomeprazole (NEXIUM) 10 MG packet Take 10 mg by mouth daily before breakfast.       naproxen sodium (ALEVE) 220 MG tablet Take 220 mg by mouth in the morning and at bedtime.                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  Lives With: Significant other, Son   Functional History: Prior Function Prior Level of Function : Independent/Modified Independent, Driving Mobility Comments: indep ADLs Comments: indep, doesnt work   Functional Status:  Mobility: Bed Mobility Overal bed mobility: Needs Assistance Bed Mobility: Supine to Sit Supine to sit: Min assist, HOB elevated General bed mobility comments: Pt in recliner on arrival and at departure Transfers Overall transfer level: Needs assistance Equipment used: Rolling walker (2 wheels) Transfers: Sit to/from Stand Sit to Stand: Contact guard assist Bed to/from chair/wheelchair/BSC transfer type:: Stand pivot Stand pivot transfers: Contact guard assist General transfer comment: CGA verbal cues for safety with sequencing. Ambulation/Gait Ambulation/Gait assistance: Contact guard assist, Min assist Gait Distance (Feet): 150 Feet Assistive device: Rolling walker (2 wheels) Gait Pattern/deviations: Step-to pattern, Step-through pattern, Knee hyperextension - right, Decreased  dorsiflexion - right, Narrow base of support General Gait Details: intermittent step to step through pattern. Pt tends to forget to step fully with the RLE which leads to toe drag moderate steppage gait on the R. Verbal cues for stepping with the R foot toward the R leg on the RW w/improved BOS initially adduction at the RLE. Verbal cues for stepping and initial contact w/RLE to improve gait pattern. Gait velocity: decreased cadence Gait velocity interpretation: <1.8 ft/sec, indicate of risk for recurrent falls   ADL: ADL Overall ADL's : Needs assistance/impaired Eating/Feeding: Set up, Sitting Grooming: Supervision/safety, Sitting Grooming Details (indicate cue type and reason): at sink Upper Body Bathing: Minimal assistance, Sitting Upper Body Bathing Details (indicate cue type and reason): assistance for back Lower Body Bathing: Moderate assistance, Sit to/from stand Lower Body Bathing Details (indicate cue type and reason): able to bathe legs while seated requires assistance with periarea back while standing due to reliant on BUE support Upper Body Dressing :  Supervision/safety, Sitting Upper Body Dressing Details (indicate cue type and reason): donn gown Lower Body Dressing: Moderate assistance, Sitting/lateral leans Lower Body Dressing Details (indicate cue type and reason): able to doff socks and donn left sock. assistance to maintain figure 4 with RLE and mod assist to donn sock Toilet Transfer: Minimal assistance, +2 for physical assistance, +2 for safety/equipment, Cueing for safety, Cueing for sequencing, Ambulation, BSC/3in1, Rolling walker (2 wheels) Toileting- Clothing Manipulation and Hygiene: Minimal assistance, Sitting/lateral lean Functional mobility during ADLs: Minimal assistance, +2 for physical assistance, +2 for safety/equipment, Rolling walker (2 wheels) General ADL Comments: increased use of RUE   Cognition: Cognition Overall Cognitive Status: Impaired/Different  from baseline Arousal/Alertness: Awake/alert Orientation Level: Oriented X4 Attention: Focused Focused Attention: Impaired Focused Attention Impairment: Verbal complex, Functional complex Memory: Impaired Memory Impairment: Decreased short term memory, Decreased recall of new information, Retrieval deficit Decreased Short Term Memory: Verbal complex, Functional complex Awareness: Appears intact Cognition Arousal: Alert Behavior During Therapy: WFL for tasks assessed/performed Overall Cognitive Status: Impaired/Different from baseline Area of Impairment: Safety/judgement Safety/Judgement: Decreased awareness of deficits, Decreased awareness of safety General Comments: cues for safety and sequencing     Blood pressure (!) 164/114, pulse 86, temperature 98.2 F (36.8 C), temperature source Oral, resp. rate 18, height 5\' 7"  (1.702 m), weight 69.7 kg, SpO2 97%. Physical Exam Vitals and nursing note reviewed.  Constitutional:      Appearance: Normal appearance.  HENT:     Head: Normocephalic.     Right Ear: External ear normal.     Left Ear: External ear normal.     Nose: Nose normal.     Mouth/Throat:     Comments: Missing numerous teeth Eyes:     Pupils: Pupils are equal, round, and reactive to light.  Cardiovascular:     Rate and Rhythm: Normal rate and regular rhythm.     Pulses: Normal pulses.     Heart sounds: No murmur heard.    No gallop.  Pulmonary:     Effort: Pulmonary effort is normal. No respiratory distress.     Breath sounds: Normal breath sounds.  Abdominal:     General: Bowel sounds are normal. There is no distension.     Palpations: Abdomen is soft.     Tenderness: There is no abdominal tenderness.  Musculoskeletal:        General: No swelling or deformity. Normal range of motion.     Cervical back: Normal range of motion.  Skin:    General: Skin is warm and dry.  Neurological:     Mental Status: He is alert and oriented to person, place, and time.      Comments: Alert and oriented x 3. Normal insight and awareness. Intact Memory. Normal language and speech sl dysarthric. Cranial nerve exam unremarkable except for mild right central 7.. MMT: RUE 4 to 4+/5 prox to distal. LUE 5/5. RLE 2/5 HF, 1+ to 2- KE and 0/5 ADF/PF. LLE 5/5. Sensory exam normal for light touch and pain in all 4 limbs. No limb ataxia or cerebellar signs. No abnormal tone appreciated.  Marland Kitchen       Psychiatric:        Mood and Affect: Mood normal.        Behavior: Behavior normal.        Lab Results Last 48 Hours        Results for orders placed or performed during the hospital encounter of 04/26/23 (from the past 48 hour(s))  Basic metabolic  panel     Status: Abnormal    Collection Time: 04/29/23  7:53 AM  Result Value Ref Range    Sodium 135 135 - 145 mmol/L    Potassium 4.2 3.5 - 5.1 mmol/L    Chloride 102 98 - 111 mmol/L    CO2 20 (L) 22 - 32 mmol/L    Glucose, Bld 109 (H) 70 - 99 mg/dL      Comment: Glucose reference range applies only to samples taken after fasting for at least 8 hours.    BUN 21 (H) 6 - 20 mg/dL    Creatinine, Ser 8.29 0.61 - 1.24 mg/dL    Calcium 9.3 8.9 - 56.2 mg/dL    GFR, Estimated >13 >08 mL/min      Comment: (NOTE) Calculated using the CKD-EPI Creatinine Equation (2021)      Anion gap 13 5 - 15      Comment: Performed at [facility], [address], [place], [place] [address]  CBC     Status: None    Collection Time: 04/29/23  7:53 AM  Result Value Ref Range    WBC 7.8 4.0 - 10.5 K/uL    RBC 5.54 4.22 - 5.81 MIL/uL    Hemoglobin 16.2 13.0 - 17.0 g/dL    HCT 69.6 29.5 - 28.4 %    MCV 90.8 80.0 - 100.0 fL    MCH 29.2 26.0 - 34.0 pg    MCHC 32.2 30.0 - 36.0 g/dL    RDW 13.2 44.0 - 10.2 %    Platelets 182 150 - 400 K/uL    nRBC 0.0 0.0 - 0.2 %      Comment: Performed at [facility], [address] [place], [place] [address]  Basic metabolic panel     Status: Abnormal    Collection Time: 04/30/23  6:18 AM  Result  Value Ref Range    Sodium 136 135 - 145 mmol/L    Potassium 4.5 3.5 - 5.1 mmol/L    Chloride 100 98 - 111 mmol/L    CO2 26 22 - 32 mmol/L    Glucose, Bld 91 70 - 99 mg/dL      Comment: Glucose reference range applies only to samples taken after fasting for at least 8 hours.    BUN 22 (H) 6 - 20 mg/dL    Creatinine, Ser 6.44 0.61 - 1.24 mg/dL    Calcium 9.5 8.9 - 03.4 mg/dL    GFR, Estimated >74 >25 mL/min      Comment: (NOTE) Calculated using the CKD-EPI Creatinine Equation (2021)      Anion gap 10 5 - 15      Comment: Performed at [facility], [address] [place], [place] [address]  CBC     Status: None    Collection Time: 04/30/23  6:18 AM  Result Value Ref Range    WBC 9.1 4.0 - 10.5 K/uL    RBC 5.70 4.22 - 5.81 MIL/uL    Hemoglobin 16.7 13.0 - 17.0 g/dL    HCT 75.6 43.3 - 29.5 %    MCV 90.0 80.0 - 100.0 fL    MCH 29.3 26.0 - 34.0 pg    MCHC 32.6 30.0 - 36.0 g/dL    RDW 18.8 41.6 - 60.6 %    Platelets 204 150 - 400 K/uL    nRBC 0.0 0.0 - 0.2 %      Comment: Performed at [facility], [address] [place], [place] [address]      Imaging Results (Last 48 hours)  No results found.         Blood pressure (!) 164/114, pulse 86, temperature 98.2 F (36.8 C), temperature source Oral, resp. rate 18, height 5\' 7"  (1.702 m), weight 69.7 kg, SpO2 97%.   Medical Problem List and Plan: 1. 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 Will likely need AFO for gait 2.  Antithrombotics: -DVT/anticoagulation:  Pharmaceutical: Lovenox             -antiplatelet therapy: DAPT X 3 months followed by ASA alone.  3. Pain Management: Tylenol prn mild and ultram prn moderate pain             --continue Lidoderm patches for local measure.  4. Mood/Behavior/Sleep: LCSW to follow for evaluation and support.               -antipsychotic agents: N/A 5. Neuropsych/cognition: This patient is capable of making decisions on his own behalf. 6. Skin/Wound Care: Routine pressure relief measures.  7. Fluids/Electrolytes/Nutrition: Monitor I/O. Check CMET in am 8. HTN: Monitor BP TID             -BP poorly controlled at present             -add hydralazine for BP > 199/99 9. CAD: Monitor for symptoms with increase in activity.  --daily wts. Monitor for signs of overload.              -- On Cozaar, metoprolol, Crestor,  10 Pre-renal azotemia: Encourage fluid intake.  11. Tobacco dependence: Continue nicotine patch.              --educate on importance of tobacco cessation 12. Aortoiliac disease w/ occluded Left CIA/infrainguinal dz:  NO rest pain. Continue ASA and Lipitor.              --smoking cessation.  --follow up with Dr. Randie Heinz after discharge.          Jacquelynn Cree, PA-C 04/30/2023  I have personally performed a face to face diagnostic evaluation of this patient and formulated the key components of the plan.  Additionally, I have personally reviewed laboratory data, imaging studies, as well as relevant notes and concur with the physician assistant's documentation above.  The patient's status has not changed from the original H&P.  Any changes in documentation from the acute care chart have been noted above.  Ranelle Oyster, MD, [place]

## 2023-04-30 NOTE — Plan of Care (Signed)
  Problem: Education: Goal: Knowledge of disease or condition will improve Outcome: Progressing   Problem: Education: Goal: Knowledge of secondary prevention will improve (MUST DOCUMENT ALL) Outcome: Progressing   Problem: Education: Goal: Knowledge of patient specific risk factors will improve Loraine Leriche N/A or DELETE if not current risk factor) Outcome: Progressing   Problem: Ischemic Stroke/TIA Tissue Perfusion: Goal: Complications of ischemic stroke/TIA will be minimized Outcome: Progressing   Problem: Health Behavior/Discharge Planning: Goal: Ability to manage health-related needs will improve Outcome: Progressing   Problem: Self-Care: Goal: Ability to participate in self-care as condition permits will improve Outcome: Progressing   Problem: Education: Goal: Knowledge of General Education information will improve Description: Including pain rating scale, medication(s)/side effects and non-pharmacologic comfort measures Outcome: Progressing   Problem: Nutrition: Goal: Adequate nutrition will be maintained Outcome: Progressing   Problem: Coping: Goal: Level of anxiety will decrease Outcome: Progressing   Problem: Pain Managment: Goal: General experience of comfort will improve Outcome: Progressing   Problem: Safety: Goal: Ability to remain free from injury will improve Outcome: Progressing

## 2023-04-30 NOTE — TOC Transition Note (Signed)
Transition of Care [facility]) - CM/SW Discharge Note   Patient Details  Name: OMARRI NGAN MRN: [id number] Date of Birth: 1960-12-26  Transition of Care [facility]) CM/SW Contact:  Kermit Balo, RN Phone Number: 04/30/2023, 10:25 AM   Clinical Narrative:    Pt is discharging to CIR today. CM signing off.    Final next level of care: IP Rehab Facility Barriers to Discharge: Inadequate or no insurance, Barriers Unresolved (comment)   Patient Goals and CMS Choice CMS Medicare.gov Compare Post Acute Care list provided to:: Patient Choice offered to / list presented to : Patient  Discharge Placement                         Discharge Plan and Services Additional resources added to the After Visit Summary for                                       Social Determinants of Health (SDOH) Interventions SDOH Screenings   Food Insecurity: Food Insecurity Present (04/30/2023)  Housing: Low Risk  (04/30/2023)  Transportation Needs: No Transportation Needs (04/30/2023)  Utilities: Not At Risk (04/30/2023)  Tobacco Use: High Risk (04/30/2023)     Readmission Risk Interventions     No data to display

## 2023-04-30 NOTE — Progress Notes (Signed)
Inpatient Rehab Admissions Coordinator:   I have a bed for this patient to admit to CIR today.  Dr. Roda Shutters in agreement and [facility] aware.  I will let pt/family know.   Estill Dooms, PT, DPT Admissions Coordinator [phone] 04/30/23  9:52 AM

## 2023-05-01 DIAGNOSIS — I63522 Cerebral infarction due to unspecified occlusion or stenosis of left anterior cerebral artery: Secondary | ICD-10-CM | POA: Diagnosis not present

## 2023-05-01 DIAGNOSIS — F17210 Nicotine dependence, cigarettes, uncomplicated: Secondary | ICD-10-CM | POA: Diagnosis not present

## 2023-05-01 DIAGNOSIS — I10 Essential (primary) hypertension: Secondary | ICD-10-CM | POA: Diagnosis not present

## 2023-05-01 DIAGNOSIS — R7989 Other specified abnormal findings of blood chemistry: Secondary | ICD-10-CM | POA: Diagnosis not present

## 2023-05-01 LAB — COMPREHENSIVE METABOLIC PANEL
ALT: 29 U/L (ref 0–44)
AST: 39 U/L (ref 15–41)
Albumin: 3.5 g/dL (ref 3.5–5.0)
Alkaline Phosphatase: 75 U/L (ref 38–126)
Anion gap: 10 (ref 5–15)
BUN: 27 mg/dL — ABNORMAL HIGH (ref 6–20)
CO2: 25 mmol/L (ref 22–32)
Calcium: 9.5 mg/dL (ref 8.9–10.3)
Chloride: 98 mmol/L (ref 98–111)
Creatinine, Ser: 1.42 mg/dL — ABNORMAL HIGH (ref 0.61–1.24)
GFR, Estimated: 57 mL/min — ABNORMAL LOW (ref >60)
Glucose, Bld: 93 mg/dL (ref 70–99)
Potassium: 4.6 mmol/L (ref 3.5–5.1)
Sodium: 133 mmol/L — ABNORMAL LOW (ref 135–145)
Total Bilirubin: 0.7 mg/dL (ref 0.3–1.2)
Total Protein: 7.1 g/dL (ref 6.5–8.1)

## 2023-05-01 LAB — CBC WITH DIFFERENTIAL/PLATELET
Abs Immature Granulocytes: 0.06 10*3/uL (ref 0.00–0.07)
Basophils Absolute: 0.1 10*3/uL (ref 0.0–0.1)
Basophils Relative: 1 %
Eosinophils Absolute: 0.6 10*3/uL — ABNORMAL HIGH (ref 0.0–0.5)
Eosinophils Relative: 7 %
HCT: 49.4 % (ref 39.0–52.0)
Hemoglobin: 16.8 g/dL (ref 13.0–17.0)
Immature Granulocytes: 1 %
Lymphocytes Relative: 24 %
Lymphs Abs: 2 10*3/uL (ref 0.7–4.0)
MCH: 30.4 pg (ref 26.0–34.0)
MCHC: 34 g/dL (ref 30.0–36.0)
MCV: 89.3 fL (ref 80.0–100.0)
Monocytes Absolute: 1 10*3/uL (ref 0.1–1.0)
Monocytes Relative: 12 %
Neutro Abs: 4.7 10*3/uL (ref 1.7–7.7)
Neutrophils Relative %: 55 %
Platelets: 221 10*3/uL (ref 150–400)
RBC: 5.53 MIL/uL (ref 4.22–5.81)
RDW: 14.3 % (ref 11.5–15.5)
WBC: 8.4 10*3/uL (ref 4.0–10.5)
nRBC: 0 % (ref 0.0–0.2)

## 2023-05-01 LAB — MAGNESIUM: Magnesium: 2.1 mg/dL (ref 1.7–2.4)

## 2023-05-01 LAB — VITAMIN D 25 HYDROXY (VIT D DEFICIENCY, FRACTURES): Vit D, 25-Hydroxy: 26.64 ng/mL — ABNORMAL LOW (ref 30–100)

## 2023-05-01 MED ORDER — VITAMIN D (ERGOCALCIFEROL) 1.25 MG (50000 UNIT) PO CAPS
50000.0000 [IU] | ORAL_CAPSULE | ORAL | Status: DC
  Administered 2023-05-01: 50000 [IU] via ORAL
  Filled 2023-05-01: qty 1

## 2023-05-01 MED ORDER — NICOTINE 21 MG/24HR TD PT24
21.0000 mg | MEDICATED_PATCH | Freq: Every day | TRANSDERMAL | Status: DC
  Administered 2023-05-01 – 2023-05-07 (×7): 21 mg via TRANSDERMAL
  Filled 2023-05-01 (×7): qty 1

## 2023-05-01 NOTE — Evaluation (Signed)
Occupational Therapy Assessment and Plan  Patient Details  Name: RAYYAAN BEGGS MRN: [id number] Date of Birth: [birth year]  OT Diagnosis: cognitive deficits, hemiplegia affecting dominant side, lumbago (low back pain), and pain in joint Rehab Potential: Rehab Potential (ACUTE ONLY): Good ELOS: 10-12   Today's Date: 05/01/2023 OT Individual Time: 0800-0900 OT Individual Time Calculation (min): 60 min     Hospital Problem: Principal Problem:   Acute ischemic left ACA stroke [facility])   Past Medical History:  Past Medical History:  Diagnosis Date   Coronary artery disease    Hyperlipidemia    Hypertension    Lumbar disc disease    Myocardial infarction (HCC)    9/18 PCI/DESx1 to RCA, EF 45%   Smoking    Systolic heart failure (HCC)    Past Surgical History:  Past Surgical History:  Procedure Laterality Date   CARDIAC CATHETERIZATION     CORONARY/GRAFT ACUTE MI REVASCULARIZATION N/A 04/25/2017   Procedure: Coronary/Graft Acute MI Revascularization;  Surgeon: Runell Gess, MD;  Location: [facility] INVASIVE CV [facility];  Service: Cardiovascular;  Laterality: N/A;   KNEE SURGERY     LEFT HEART CATH AND CORONARY ANGIOGRAPHY N/A 04/25/2017   Procedure: LEFT HEART CATH AND CORONARY ANGIOGRAPHY;  Surgeon: Runell Gess, MD;  Location: [facility] INVASIVE CV LAB;  Service: Cardiovascular;  Laterality: N/A;   LUMBAR LAMINECTOMY      Assessment & Plan Clinical Impression:  Jhoan Pierotti is a 62 year old male with history of CAD, HTN, Systolic CHF, tobacco use- I PPD who was admitted on 04/26/23 with right sided weakness and coolness RLE. CTA head was negative for LVO and showed severe stenosis distal R-P3 segment and left P1 segment, tiny 2 mm aneurysm and carotid bifurcation showed 70% stenosis proximal R-ICA and 50% stenosis proximal L-ICA. 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 UDS positive for THC.  MRI brain showed L-ACA territory infarct. 2D echo showed EF 50-55% with now all abnormality and no shunt.  Patient noted to have RUE/RLE weakness which is resolving.   Elevated BP treated with titration of meds. Dr. Roda Shutters felt that stroke likely due to small vessel disease and recs for DAPT X 3 months followed by ASA indefinitely. Dr. Randie Heinz consulted for input and recommended outpatient follow up on PAD as patient with palpable right PT pulse and able to ambulate 2 blocks prior to symptoms of claudication. Carotid  dopplers ordered  showing 40-59% R-ICA stenosis and 1-39% L-ICA stenosis. Patient was educated on importance of smoking cessation by vascular and to follow up in office for work up. Therapy has been working with patient who requires cues for BOS and RLE weakness with mobility,  assistance with mobility as well as min assist with ADLs.  Patient transferred to CIR on 04/30/2023 .    Patient currently requires min-mod with basic self-care skills and IADL secondary to muscle weakness, decreased cardiorespiratoy endurance, impaired timing and sequencing, unbalanced muscle activation, decreased coordination, and decreased motor planning, decreased attention, decreased problem solving, decreased safety awareness, and decreased memory, central origin, and decreased sitting balance, decreased standing balance, decreased postural control, hemiplegia, and decreased balance strategies.  Prior to hospitalization, patient could complete BADLs and IADLs  with independent .  Patient will benefit from skilled intervention to decrease level of assist with basic self-care skills, increase independence with basic self-care skills, and increase  level of independence with iADL prior to discharge home with care partner.  Anticipate patient will require intermittent supervision and  follow-up services to be determined .  OT - End of Session Activity Tolerance: Decreased this session Endurance Deficit: Yes Endurance Deficit Description: SOB during BADLs with seated rest breaks required OT Assessment Rehab Potential (ACUTE ONLY): Good OT Patient demonstrates impairments in the following area(s): Balance;Behavior;Safety;Cognition;Skin Integrity;Endurance;Motor;Pain OT Basic ADL's Functional Problem(s): Grooming;Bathing;Dressing;Toileting OT Advanced ADL's Functional Problem(s): Simple Meal Preparation OT Transfers Functional Problem(s): Tub/Shower;Toilet OT Additional Impairment(s): Fuctional Use of Upper Extremity OT Plan OT Intensity: Minimum of 1-2 x/day, 45 to 90 minutes OT Frequency: 5 out of 7 days OT Duration/Estimated Length of Stay: 10-12 OT Treatment/Interventions: Balance/vestibular training;Discharge planning;Pain management;Self Care/advanced ADL retraining;Therapeutic Activities;UE/LE Coordination activities;Cognitive remediation/compensation;Disease mangement/prevention;Functional mobility training;Patient/family education;Skin care/wound managment;Functional electrical stimulation;Therapeutic Exercise;Community reintegration;DME/adaptive equipment instruction;Neuromuscular re-education;Psychosocial support;Splinting/orthotics;UE/LE Strength taining/ROM OT Self Feeding Anticipated Outcome(s): Independent OT Basic Self-Care Anticipated Outcome(s): Mod I-superivsion OT Toileting Anticipated Outcome(s): Mod I-supervision OT Bathroom Transfers Anticipated Outcome(s): Mod I-supervision OT Recommendation Recommendations for Other Services: Therapeutic Recreation consult Therapeutic Recreation Interventions:  (dance group) Patient destination: Home Follow Up Recommendations: Outpatient OT;None (To be determined) Equipment  Recommended: To be determined   OT Evaluation Precautions/Restrictions  Precautions Precautions: Fall;Other (comment) Precaution Comments: BP <180/105 Restrictions Weight Bearing Restrictions: No General Chart Reviewed: Yes Additional Pertinent History: HTN, cystolic CHF, CAD, tobacco use Family/Caregiver Present: No Pain  4/10 in back d/t chronic back pain Home Living/Prior Functioning Home Living Available Help at Discharge: Family, Available PRN/intermittently Type of Home: House Home Access: Stairs to enter Secretary/administrator of Steps: 2 Home Layout: One level Bathroom Shower/Tub: Nurse, adult Accessibility: Yes Additional Comments: girlfriend works 3rd shift, son works 1st shift and 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  Lives With: Significant other, Son IADL History Homemaking Responsibilities: Yes Meal Prep Responsibility: Training and development officer Responsibility: Secondary Cleaning Responsibility: Secondary Bill Paying/Finance Responsibility: Primary Shopping Responsibility: Secondary Child Care Responsibility: No Current License: Yes Mode of Transportation: Car Occupation: Other (comment) (waiting to get on disability per Pt report) Prior Function Level of Independence: Independent with basic ADLs, Independent with homemaking with ambulation  Able to Take Stairs?: Yes Driving: Yes Vocation: Unemployed Vision Baseline Vision/History: 1 Wears glasses Ability to See in Adequate Light: 0 Adequate (needs glasses 24/7) Patient Visual Report: No change from baseline Vision Assessment?: No apparent visual deficits Perception  Perception: Within Functional Limits Praxis Praxis: WFL Cognition Cognition Overall Cognitive Status: Impaired/Different from baseline Arousal/Alertness: Awake/alert Orientation Level: Person;Place;Situation Person: Oriented Place: Oriented Situation: Oriented Memory: Impaired Memory Impairment: Decreased short  term memory;Decreased recall of new information;Retrieval deficit Decreased Short Term Memory: Verbal complex;Functional complex Attention: Selective Selective Attention: Impaired Awareness: Appears intact Problem Solving: Impaired (mild impairment) Problem Solving Impairment: Functional basic Executive Function: Self Monitoring Self Monitoring: Impaired Self Monitoring Impairment: Functional basic Behaviors: Impulsive (very mild, Pt reports he had ADD at baseline and like to "move fast") Safety/Judgment: Impaired Brief Interview for Mental Status (BIMS) Repetition of Three Words (First Attempt): 3 Temporal Orientation: Year: Correct Temporal Orientation: Month: Accurate within 5 days Temporal Orientation: Day: Correct Recall: "Sock": Yes, after cueing ("something to wear") Recall: "Blue": Yes, no cue required Recall: "Bed": Yes, no cue required BIMS Summary Score: 14 Sensation Sensation Light Touch: Appears Intact Hot/Cold: Appears Intact Proprioception: Not  tested Stereognosis: Not tested Coordination Gross Motor Movements are Fluid and Coordinated: No Fine Motor Movements are Fluid and Coordinated: Yes Coordination and Movement Description: decreased coordination and strength in R LE Finger Nose Finger Test: Smooth equal movements Motor  Motor Motor: Hemiplegia Motor - Skilled Clinical Observations: R hemiplegia present in R LE>UE  Trunk/Postural Assessment  Cervical Assessment Cervical Assessment: Within Functional Limits Thoracic Assessment Thoracic Assessment: Exceptions to [facility] (mild rounded shoudlers) Lumbar Assessment Lumbar Assessment: Exceptions to [facility] (mild posterior pelvic tilt) Postural Control Postural Control: Deficits on evaluation (required RW for stability in standing) Protective Responses: delayed  Balance Balance Balance Assessed: Yes Static Sitting Balance Static Sitting - Balance Support: Feet supported Static Sitting - Level of Assistance: 5:  Stand by assistance Dynamic Sitting Balance Dynamic Sitting - Balance Support: Feet supported Dynamic Sitting - Level of Assistance: 4: Min assist;5: Stand by assistance Dynamic Sitting - Balance Activities: Reaching for objects;Reaching across midline Static Standing Balance Static Standing - Balance Support: During functional activity;Bilateral upper extremity supported Static Standing - Level of Assistance: 5: Stand by assistance;4: Min assist (CGA) Dynamic Standing Balance Dynamic Standing - Balance Support: Bilateral upper extremity supported Dynamic Standing - Level of Assistance: 4: Min assist Dynamic Standing - Comments: BADLs Extremity/Trunk Assessment RUE Assessment RUE Assessment: Exceptions to [facility] Passive Range of Motion (PROM) Comments: WFL Active Range of Motion (AROM) Comments: WFL General Strength Comments: mild strength deficit 4+/5 proximally and 4/5 distally LUE Assessment LUE Assessment: Within Functional Limits  Care Tool Care Tool Self Care Eating   Eating Assist Level: Set up assist    Oral Care    Oral Care Assist Level: Set up assist    Bathing   Body parts bathed by patient: Right arm;Left arm;Chest;Abdomen;Front perineal area;Right upper leg;Left upper leg;Face;Right lower leg;Left lower leg Body parts bathed by helper: Buttocks   Assist Level: Minimal Assistance - Patient > 75%    Upper Body Dressing(including orthotics)   What is the patient wearing?: Pull over shirt   Assist Level: Supervision/Verbal cueing    Lower Body Dressing (excluding footwear)   What is the patient wearing?: Pants;Underwear/pull up Assist for lower body dressing: Moderate Assistance - Patient 50 - 74%    Putting on/Taking off footwear   What is the patient wearing?: Socks Assist for footwear: Moderate Assistance - Patient 50 - 74%       Care Tool Toileting Toileting activity   Assist for toileting: Minimal Assistance - Patient > 75%     Care Tool Bed  Mobility Roll left and right activity   Roll left and right assist level: Supervision/Verbal cueing    Sit to lying activity   Sit to lying assist level: Contact Guard/Touching assist    Lying to sitting on side of bed activity   Lying to sitting on side of bed assist level: the ability to move from lying on the back to sitting on the side of the bed with no back support.: Contact Guard/Touching assist     Care Tool Transfers Sit to stand transfer   Sit to stand assist level: Minimal Assistance - Patient > 75%    Chair/bed transfer   Chair/bed transfer assist level: Minimal Assistance - Patient > 75%     Toilet transfer   Assist Level: Minimal Assistance - Patient > 75%     Care Tool Cognition  Expression of Ideas and Wants Expression of Ideas and Wants: 4. Without difficulty (complex and basic) - expresses complex messages without difficulty  and with speech that is clear and easy to understand  Understanding Verbal and Non-Verbal Content Understanding Verbal and Non-Verbal Content: 4. Understands (complex and basic) - clear comprehension without cues or repetitions   Memory/Recall Ability Memory/Recall Ability : Current season;That he or she is in a hospital/hospital unit   Refer to Care Plan for Long Term Goals  SHORT TERM GOAL WEEK 1 OT Short Term Goal 1 (Week 1): Pt will complete toilet/shower transfers with CGA using LRAD OT Short Term Goal 2 (Week 1): Pt will complete LB dressing with CGA using modfied techniques PRN and LRAD OT Short Term Goal 3 (Week 1): Pt wil recall hemi-dressing technique with min questioning cues OT Short Term Goal 4 (Week 1): Pt complete toileting wiht CGA  Recommendations for other services: Therapeutic Recreation  Other Dance group    Skilled Therapeutic Intervention Skilled Therapeutic Interventions/Progress Updates:  1:1 OT evaluation and intervention initiated with skilled education provided on OT role, goals, and POC. Completed assessments  on sensation, vision, cognition, MMT, and balance at beginning of evaluation/therapy session. Pt received using BSC with nursing staff present in room upon OT arrival. Pt presenting to be in good spirits receptive to skilled OT session reporting 4/10 chronic pain in back with RN in/out to provide medications- OT offering intermittent rest breaks, repositioning, and therapeutic support to optimize participation in therapy session. MD in/out for morning rounding. Pt completed BADLs at levels listed below with skilled education provided on energy conservation, CVA etiology/recovery, DME use, and hemi-techniques. Pt was left resting in recliner with call bell in reach, seat belt alarm on, and all needs met.   ADL ADL Equipment Provided: Long-handled shoe horn Eating: Set up Where Assessed-Eating: Bed level Grooming: Supervision/safety Where Assessed-Grooming: Sitting at sink Upper Body Bathing: Contact guard Where Assessed-Upper Body Bathing: Shower Lower Body Bathing: Minimal assistance Where Assessed-Lower Body Bathing: Shower Upper Body Dressing: Supervision/safety Where Assessed-Upper Body Dressing: Wheelchair Lower Body Dressing: Moderate assistance;Minimal assistance Where Assessed-Lower Body Dressing: Wheelchair Toileting: Minimal assistance Where Assessed-Toileting: Teacher, adult education: Curator Method: Surveyor, minerals: Engineer, technical sales: Not assessed Film/video editor: Insurance underwriter Method: Warden/ranger: Grab bars Mobility  Bed Mobility Bed Mobility: Supine to Sit;Sit to Supine;Rolling Left;Rolling Right Rolling Right: Supervision/verbal cueing Rolling Left: Supervision/Verbal cueing Supine to Sit: Contact Guard/Touching assist Sit to Supine: Contact Guard/Touching assist Transfers Sit to Stand: Minimal Assistance - Patient > 75% Stand to Sit: Minimal  Assistance - Patient > 75%   Discharge Criteria: Patient will be discharged from OT if patient refuses treatment 3 consecutive times without medical reason, if treatment goals not met, if there is a change in medical status, if patient makes no progress towards goals or if patient is discharged from hospital.  The above assessment, treatment plan, treatment alternatives and goals were discussed and mutually agreed upon: by patient  Clide Deutscher 05/01/2023, 12:29 PM

## 2023-05-01 NOTE — Plan of Care (Signed)
  Problem: RH Balance Goal: LTG Patient will maintain dynamic sitting balance (PT) Description: LTG:  Patient will maintain dynamic sitting balance with assistance during mobility activities (PT) Flowsheets (Taken 05/01/2023 1558) LTG: Pt will maintain dynamic sitting balance during mobility activities with:: Independent with assistive device  Goal: LTG Patient will maintain dynamic standing balance (PT) Description: LTG:  Patient will maintain dynamic standing balance with assistance during mobility activities (PT) Flowsheets (Taken 05/01/2023 1558) LTG: Pt will maintain dynamic standing balance during mobility activities with:: Independent with assistive device    Problem: Sit to Stand Goal: LTG:  Patient will perform sit to stand with assistance level (PT) Description: LTG:  Patient will perform sit to stand with assistance level (PT) Flowsheets (Taken 05/01/2023 1558) LTG: PT will perform sit to stand in preparation for functional mobility with assistance level: Independent with assistive device   Problem: RH Bed to Chair Transfers Goal: LTG Patient will perform bed/chair transfers w/assist (PT) Description: LTG: Patient will perform bed to chair transfers with assistance (PT). Flowsheets (Taken 05/01/2023 1558) LTG: Pt will perform Bed to Chair Transfers with assistance level: Independent with assistive device    Problem: RH Car Transfers Goal: LTG Patient will perform car transfers with assist (PT) Description: LTG: Patient will perform car transfers with assistance (PT). Flowsheets (Taken 05/01/2023 1558) LTG: Pt will perform car transfers with assist:: Independent with assistive device    Problem: RH Ambulation Goal: LTG Patient will ambulate in controlled environment (PT) Description: LTG: Patient will ambulate in a controlled environment, # of feet with assistance (PT). 05/01/2023 1814 by Gilman Buttner, Student-PT Flowsheets Taken 05/01/2023 1814 LTG: Pt will ambulate in  controlled environ  assist needed:: Independent with assistive device Taken 05/01/2023 1558 LTG: Ambulation distance in controlled environment: 150 ft 05/01/2023 1558 by Gilman Buttner, Student-PT Flowsheets (Taken 05/01/2023 1558) LTG: Pt will ambulate in controlled environ  assist needed:: Supervision/Verbal cueing LTG: Ambulation distance in controlled environment: 150 ft Goal: LTG Patient will ambulate in home environment (PT) Description: LTG: Patient will ambulate in home environment, # of feet with assistance (PT). 05/01/2023 1814 by Gilman Buttner, Student-PT Flowsheets Taken 05/01/2023 1814 LTG: Pt will ambulate in home environ  assist needed:: Independent with assistive device Taken 05/01/2023 1558 LTG: Ambulation distance in home environment: 50 ft 05/01/2023 1558 by Gilman Buttner, Student-PT Flowsheets (Taken 05/01/2023 1558) LTG: Pt will ambulate in home environ  assist needed:: Supervision/Verbal cueing LTG: Ambulation distance in home environment: 50 ft   Problem: RH Stairs Goal: LTG Patient will ambulate up and down stairs w/assist (PT) Description: LTG: Patient will ambulate up and down # of stairs with assistance (PT) 05/01/2023 1814 by Gilman Buttner, Student-PT Flowsheets Taken 05/01/2023 1814 LTG: Pt will ambulate up/down stairs assist needed:: Independent with assistive device Taken 05/01/2023 1558 LTG: Pt will  ambulate up and down number of stairs: 12 05/01/2023 1558 by Gilman Buttner, Student-PT Flowsheets (Taken 05/01/2023 1558) LTG: Pt will ambulate up/down stairs assist needed:: Supervision/Verbal cueing LTG: Pt will  ambulate up and down number of stairs: 12

## 2023-05-01 NOTE — Plan of Care (Signed)
  Problem: RH Balance Goal: LTG Patient will maintain dynamic standing with ADLs (OT) Description: LTG:  Patient will maintain dynamic standing balance with assist during activities of daily living (OT)  Flowsheets (Taken 05/01/2023 1242) LTG: Pt will maintain dynamic standing balance during ADLs with: Supervision/Verbal cueing   Problem: Sit to Stand Goal: LTG:  Patient will perform sit to stand in prep for activites of daily living with assistance level (OT) Description: LTG:  Patient will perform sit to stand in prep for activites of daily living with assistance level (OT) Flowsheets (Taken 05/01/2023 1242) LTG: PT will perform sit to stand in prep for activites of daily living with assistance level: Supervision/Verbal cueing   Problem: RH Bathing Goal: LTG Patient will bathe all body parts with assist levels (OT) Description: LTG: Patient will bathe all body parts with assist levels (OT) Flowsheets (Taken 05/01/2023 1242) LTG: Pt will perform bathing with assistance level/cueing: Supervision/Verbal cueing LTG: Position pt will perform bathing: Shower   Problem: RH Dressing Goal: LTG Patient will perform lower body dressing w/assist (OT) Description: LTG: Patient will perform lower body dressing with assist, with/without cues in positioning using equipment (OT) Flowsheets (Taken 05/01/2023 1242) LTG: Pt will perform lower body dressing with assistance level of: Supervision/Verbal cueing   Problem: RH Toileting Goal: LTG Patient will perform toileting task (3/3 steps) with assistance level (OT) Description: LTG: Patient will perform toileting task (3/3 steps) with assistance level (OT)  Flowsheets (Taken 05/01/2023 1242) LTG: Pt will perform toileting task (3/3 steps) with assistance level: Supervision/Verbal cueing   Problem: RH Functional Use of Upper Extremity Goal: LTG Patient will use RT/LT upper extremity as a (OT) Description: LTG: Patient will use right/left upper extremity as a  stabilizer/gross assist/diminished/nondominant/dominant level with assist, with/without cues during functional activity (OT) Flowsheets (Taken 05/01/2023 1242) LTG: Use of upper extremity in functional activities: RUE as dominant level LTG: Pt will use upper extremity in functional activity with assistance level of: Supervision/Verbal cueing   Problem: RH Simple Meal Prep Goal: LTG Patient will perform simple meal prep w/assist (OT) Description: LTG: Patient will perform simple meal prep with assistance, with/without cues (OT). Flowsheets (Taken 05/01/2023 1242) LTG: Pt will perform simple meal prep with assistance level of: Supervision/Verbal cueing   Problem: RH Toilet Transfers Goal: LTG Patient will perform toilet transfers w/assist (OT) Description: LTG: Patient will perform toilet transfers with assist, with/without cues using equipment (OT) Flowsheets (Taken 05/01/2023 1242) LTG: Pt will perform toilet transfers with assistance level of: Supervision/Verbal cueing   Problem: RH Tub/Shower Transfers Goal: LTG Patient will perform tub/shower transfers w/assist (OT) Description: LTG: Patient will perform tub/shower transfers with assist, with/without cues using equipment (OT) Flowsheets (Taken 05/01/2023 1242) LTG: Pt will perform tub/shower stall transfers with assistance level of: Supervision/Verbal cueing

## 2023-05-01 NOTE — Evaluation (Signed)
Physical Therapy Assessment and Plan  Patient Details  Name: Lance Santiago MRN: [id number] Date of Birth: 1961-06-21  PT Diagnosis: Abnormal posture, Abnormality of gait, Hemiplegia dominant, Muscle weakness, and Pain in back and R hip Rehab Potential: Good ELOS: 10-14   Today's Date: 05/01/2023 PT Individual Time: 6387-5643 PT Individual Time Calculation (min): 70 min    Hospital Problem: Principal Problem:   Acute ischemic left ACA stroke [facility])   Past Medical History:  Past Medical History:  Diagnosis Date   Coronary artery disease    Hyperlipidemia    Hypertension    Lumbar disc disease    Myocardial infarction (HCC)    9/18 PCI/DESx1 to RCA, EF 45%   Smoking    Systolic heart failure (HCC)    Past Surgical History:  Past Surgical History:  Procedure Laterality Date   CARDIAC CATHETERIZATION     CORONARY/GRAFT ACUTE MI REVASCULARIZATION N/A 04/25/2017   Procedure: Coronary/Graft Acute MI Revascularization;  Surgeon: Runell Gess, MD;  Location: [facility] INVASIVE CV LAB;  Service: Cardiovascular;  Laterality: N/A;   KNEE SURGERY     LEFT HEART CATH AND CORONARY ANGIOGRAPHY N/A 04/25/2017   Procedure: LEFT HEART CATH AND CORONARY ANGIOGRAPHY;  Surgeon: Runell Gess, MD;  Location: [facility] INVASIVE CV LAB;  Service: Cardiovascular;  Laterality: N/A;   LUMBAR LAMINECTOMY      Assessment & Plan Clinical Impression: Patient is a 62 y.o. year old male with history of CAD, HTN, Systolic CHF, tobacco use- I PPD who was admitted on 04/26/23 with right sided weakness and coolness RLE. CTA head was negative for LVO and showed severe stenosis distal R-P3 segment and left P1 segment, tiny 2 mm aneurysm and carotid bifurcation showed 70% stenosis proximal R-ICA and 50% stenosis proximal L-ICA. 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 UDS positive for THC. MRI brain showed L-ACA territory infarct. 2D echo showed EF 50-55% with now all abnormality and no shunt.  Patient noted to have RUE/RLE weakness which is resolving.  Patient transferred to CIR on 04/30/2023 .   Patient currently requires min with mobility secondary to muscle weakness and muscle paralysis, decreased cardiorespiratoy endurance, impaired timing and sequencing, unbalanced muscle activation, decreased coordination, and decreased motor planning, decreased visual perceptual skills, decreased attention to right and decreased motor planning, decreased attention,  , and decreased standing balance, decreased postural control, hemiplegia, and decreased balance strategies.  Prior to hospitalization, patient was independent  with mobility and lived with Significant other, Son in a House home.  Home access is 2Stairs to enter.  Patient will benefit from skilled PT intervention to maximize safe functional mobility, minimize fall risk, and decrease caregiver burden for planned discharge home alone.  Anticipate patient will benefit from follow up OP at discharge.  PT - End of Session Activity Tolerance: Tolerates 30+ min activity with multiple rests Endurance Deficit: Yes Endurance Deficit Description: SOB after ambulation and stairs, seated rest breaks to recover PT Assessment Rehab Potential (ACUTE/IP ONLY): Good PT Barriers to Discharge: Lack of/limited family support PT Patient demonstrates impairments in the following area(s): Motor;Balance;Safety;Pain;Endurance PT Transfers Functional Problem(s): Bed to Chair;Bed Mobility;Car PT Locomotion Functional Problem(s): Ambulation;Stairs PT Plan PT Intensity: Minimum of 1-2  x/day ,45 to 90 minutes PT Frequency:  5 out of 7 days PT Duration Estimated Length of Stay: 10-14 PT Treatment/Interventions: Ambulation/gait training;Balance/vestibular training;Cognitive remediation/compensation;Community reintegration;Discharge planning;Disease management/prevention;DME/adaptive equipment instruction;Functional electrical stimulation;Neuromuscular re-education;Functional mobility training;Pain management;Patient/family education;Psychosocial support;Splinting/orthotics;Stair training;Therapeutic Activities;Therapeutic Exercise;UE/LE Strength taining/ROM;UE/LE Coordination activities;Visual/perceptual remediation/compensation PT Transfers Anticipated Outcome(s): mod-I PT Locomotion Anticipated Outcome(s): mod-I PT Recommendation Recommendations for Other Services: Therapeutic Recreation consult Therapeutic Recreation Interventions: Stress management Follow Up Recommendations: Outpatient PT Patient destination: Home Equipment Recommended: To be determined   PT Evaluation Precautions/Restrictions Precautions Precautions: Fall;Other (comment) Precaution Comments: BP <180/105 Restrictions Weight Bearing Restrictions: No Pain Pain Assessment Pain Scale: 0-10 Pain Score: 0-No pain Multiple Pain Sites: No Pain Interference Pain Interference Pain Effect on Sleep: 2. Occasionally Pain Interference with Therapy Activities: 1. Rarely or not at all Pain Interference with Day-to-Day Activities: 1. 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  Lives With: Significant other;Son Prior Function Level of Independence: Independent with basic  ADLs;Independent with homemaking with ambulation  Able to Take Stairs?: Yes Driving: Yes Vocation: Unemployed Vision/Perception  Vision - History Ability to See in Adequate Light: 0 Adequate Perception Perception: impaired -- inattention/neglect spatial orientation to the R during ambulation Praxis Praxis: WFL  Cognition Overall Cognitive Status: Within Functional Limits for tasks assessed Arousal/Alertness: Awake/alert Orientation Level: Oriented X4 Year: 2024 Month: September Day of Week: Correct Attention: Focused Focused Attention: Appears intact Focused Attention Impairment: Verbal basic;Functional basic Awareness: Appears intact Awareness Impairment: Other (comment) Problem Solving: Impaired Problem Solving Impairment: Functional basic Self Monitoring: Impaired Self Monitoring Impairment: Functional basic Behaviors: Other (comment) (hyperverbose) Safety/Judgment: Appears intact Sensation Sensation Light Touch: Appears Intact Hot/Cold: Not tested Proprioception: Appears Intact Stereognosis: Not tested Coordination Gross Motor Movements are Fluid and Coordinated: No Fine Motor Movements are Fluid and Coordinated: Yes Coordination and Movement Description: decreased d/t strength deficits in RLE Finger Nose Finger Test: Smooth equal movements Heel Shin Test: strength deficits on RLE. intact LLE Motor  Motor Motor: Hemiplegia;Abnormal postural alignment and control;Motor impersistence Motor - Skilled Clinical Observations: R hemiplegia (LE > UE) ; able to activate quadriceps in standing & once in TKE but unable to from 90 degrees sitting in chair  Trunk/Postural Assessment  Cervical Assessment Cervical Assessment: Within Functional Limits Thoracic Assessment Thoracic Assessment: Exceptions to [facility] (L thoracic scoliosis, rounded shoulders) Lumbar Assessment Lumbar Assessment: Exceptions to [facility] (posterior pelvic tilt) Postural Control Postural Control: Within  Functional Limits Protective Responses: delayed  Balance Balance Balance Assessed: Yes Static Sitting Balance Static Sitting - Balance Support: Feet unsupported;Feet supported Static Sitting - Level of Assistance: 5: Stand by assistance Dynamic Sitting Balance Dynamic Sitting - Balance Support: Feet supported Dynamic Sitting - Level of Assistance: 5: Stand by assistance Dynamic Sitting - Balance Activities: Lateral lean/weight shifting;Forward lean/weight shifting;Reaching for objects;Reaching across midline Static Standing Balance Static Standing - Balance Support: No upper extremity supported;Bilateral upper extremity supported Static Standing - Level of Assistance: 4: Min assist;5: Stand by assistance Dynamic Standing Balance Dynamic Standing - Balance Support: No upper extremity supported;Bilateral upper extremity supported Dynamic Standing - Level of Assistance: 4: Min assist Dynamic Standing - Balance Activities: Lateral lean/weight shifting;Forward lean/weight shifting;Reaching for objects Dynamic Standing - Comments: ambulation and stairs  Extremity Assessment  RLE Assessment RLE Assessment: Exceptions to [facility] Passive Range of Motion (PROM) Comments: WFL Active Range of Motion (AROM) Comments: WFL General Strength Comments: quad 2-/5 after being brought into terminal knee extension RLE Strength RLE Overall Strength: Deficits Right Hip Flexion: 3-/5 Right Knee Flexion: 1/5 Right Knee Extension: 2-/5 Right Ankle Dorsiflexion: 1/5 Right Ankle Plantar Flexion: 1/5 LLE Assessment LLE Assessment: Exceptions to [facility] Passive Range of Motion (PROM) Comments: WFL Active Range of Motion (AROM) Comments: WFL General Strength Comments: 4+/5 globally  Care Tool Care Tool Bed Mobility Roll left and right activity   Roll left and right assist level: Supervision/Verbal cueing    Sit to lying activity   Sit to lying assist level: Contact Guard/Touching assist    Lying to sitting on  side of bed activity   Lying to sitting on side of bed assist level: the ability to move from lying on the back to sitting on the side of the bed with no back support.: Contact Guard/Touching assist     Care Tool Transfers Sit to stand transfer   Sit to stand assist level: Contact Guard/Touching assist    Chair/bed transfer   Chair/bed transfer assist level: Minimal Assistance - Patient > 75%     Toilet transfer   Assist Level: Minimal Assistance - Patient > 75%    Car transfer   Car transfer assist level: Minimal Assistance - Patient > 75%      Care Tool Locomotion Ambulation   Assist level: Minimal Assistance - Patient > 75% Assistive device: No Device Max distance: 200  Walk 10 feet activity   Assist level: Minimal Assistance - Patient > 75% Assistive device: No Device   Walk 50 feet with 2 turns activity   Assist level: Minimal Assistance - Patient > 75% Assistive device: No Device  Walk 150 feet activity   Assist level: Minimal Assistance - Patient > 75% Assistive device: No Device  Walk 10 feet on uneven surfaces activity   Assist level: Minimal Assistance - Patient > 75%    Stairs   Assist level: Minimal Assistance - Patient > 75% Stairs assistive device: 2 hand rails Max number of stairs: 12  Walk up/down 1 step activity   Walk up/down 1 step (curb) assist level: Minimal Assistance - Patient > 75% Walk up/down 1 step or curb assistive device: 2 hand rails  Walk up/down 4 steps activity   Walk up/down 4 steps assist level: Minimal Assistance - Patient > 75% Walk up/down 4 steps assistive device: 2 hand rails  Walk up/down 12 steps activity   Walk up/down 12 steps assist level: Minimal Assistance - Patient > 75% Walk up/down 12 steps assistive device: 2 hand rails  Pick up small objects from floor   Pick up small object from the floor assist level: Minimal Assistance - Patient > 75%    Wheelchair Is the patient using a wheelchair?: Yes Type of Wheelchair:  Manual   Wheelchair assist level: Minimal Assistance - Patient > 75% Max wheelchair distance: 150 ft  Wheel 50 feet with 2 turns activity   Assist Level: Minimal Assistance - Patient > 75%  Wheel 150 feet activity   Assist Level: Minimal Assistance - Patient > 75%    Refer to Care Plan for Long Term Goals  SHORT TERM GOAL WEEK 1 PT Short Term Goal 1 (Week 1): Pt will be able to STS w/ CGA using LRAD PT Short Term Goal 2 (Week 1): Pt will be able  to perform bed to chair w/ CGA using LRAD PT Short Term Goal 3 (Week 1): Pt will ambulate 300 ft CGA w/ LRAD PT Short Term Goal 4 (Week 1): Pt perform 12 stairs CGA w/ LRAD  Recommendations for other services: Therapeutic Recreation  Stress management  Skilled Therapeutic Intervention Evaluation completed (see details above) with patient education regarding purpose of PT evaluation, PT POC and goals, therapy schedule, weekly team meetings, and other CIR information including safety plan and fall risk safety. Pt performed the below functional mobility tasks with the specified levels of skilled cuing and assistance. Pt is generally minA w/o the use of a RW as well as for stair navigation. Pt has difficulty motor planning muscle function in open kinetic chain more than closed kinetic chain given he ambulates with no buckling of the knee. Pt does ambulate w/ slight R genu recurvatum. During ambulation pt requires increased verbal cues to watch obstacles on his R side, attempt a heel strike, step higher and step farther. Use of R DF assist wrap to encourage heel strike as well. Pt would benefit from NMR to RLE, dynamic balance, navigating obstacles, and functional mobility.  Sitting BP 137/96 , MAP 110 , 85 BPM After walking BP 149/101, MAP 114, 86 BPM After stairs BP 140/102, MAP 115, 88 BPM  Pt continent of bladder at beginning of session. CGA toilet transfer w/ RW. MinA for standing postural stability while pt manages toileting hygiene.   Pt  educated on the potential need for an AFO and new shoes w/ laces while in CIR and home return for increased independence.  Pt left in recliner, alarm set, all needs met.  Mobility Bed Mobility Bed Mobility: Supine to Sit;Sit to Supine (performed from recliner to simulate pt home setup) Rolling Right: Supervision/verbal cueing Rolling Left: Supervision/Verbal cueing Supine to Sit: Supervision/Verbal cueing Sit to Supine: Supervision/Verbal cueing Transfers Transfers: Sit to Stand;Stand to Sit;Stand Pivot Transfers Sit to Stand: Contact Guard/Touching assist Stand to Sit: Minimal Assistance - Patient > 75% Stand Pivot Transfers: Minimal Assistance - Patient > 75% Stand Pivot Transfer Details: Verbal cues for technique;Verbal cues for safe use of DME/AE;Tactile cues for placement;Tactile cues for sequencing;Verbal cues for precautions/safety;Verbal cues for gait pattern;Tactile cues for posture Transfer (Assistive device): Other (Comment) (RW and no AD) Locomotion  Gait Ambulation: Yes Gait Assistance: Minimal Assistance - Patient > 75% Gait Distance (Feet): 200 Feet Assistive device: None Gait Assistance Details: Tactile cues for weight shifting;Tactile cues for posture;Visual cues for safe use of DME/AE;Visual cues/gestures for precautions/safety;Verbal cues for technique;Verbal cues for precautions/safety;Verbal cues for gait pattern;Verbal cues for safe use of DME/AE Gait Gait: Yes Gait Pattern: Step-through pattern;Decreased stance time - right;Decreased step length - right;Decreased step length - left;Decreased hip/knee flexion - right;Decreased dorsiflexion - right;Decreased weight shift to right;Right genu recurvatum;Narrow base of support;Abducted- right;Poor foot clearance - right;Decreased trunk rotation (R spatial inattention during ambulation, R RW preference) Gait velocity: decreased Stairs / Additional Locomotion Stairs: Yes Stairs Assistance: Minimal Assistance - Patient >  75% Stair Management Technique: One rail Left Number of Stairs: 12 Height of Stairs: 6 Ramp: Minimal Assistance - Patient >75% Curb: Minimal Assistance - Patient >75% Wheelchair Mobility Wheelchair Mobility: Yes Wheelchair Assistance: Minimal assistance - Patient >75% Wheelchair Propulsion: Both upper extremities Wheelchair Parts Management: Needs assistance Distance: 150  Discharge Criteria: Patient will be discharged from PT if patient refuses treatment 3 consecutive times without medical reason, if treatment goals not met, if there is a change in medical  status, if patient makes no progress towards goals or if patient is discharged from hospital.  The above assessment, treatment plan, treatment alternatives and goals were discussed and mutually agreed upon: by patient  Gilman Buttner 05/01/2023, 1:56 PM

## 2023-05-01 NOTE — Progress Notes (Signed)
PROGRESS NOTE   Subjective/Complaints:  Pt doing well. Slept ok, better than before. Pain well controlled. LBM this morning. Urinating fine. Denies any other complaints or concerns today.   ROS: as per HPI. Denies CP, SOB, abd pain, N/V/D/C, or any other complaints at this time.    Objective:   No results found. Recent Labs    04/30/23 0618 05/01/23 0551  WBC 9.1 8.4  HGB 16.7 16.8  HCT 51.3 49.4  PLT 204 221   Recent Labs    04/30/23 0618 05/01/23 0551  NA 136 133*  K 4.5 4.6  CL 100 98  CO2 26 25  GLUCOSE 91 93  BUN 22* 27*  CREATININE 1.21 1.42*  CALCIUM 9.5 9.5     Intake/Output Summary (Last 24 hours) at 05/01/2023 0809 Last data filed at 05/01/2023 0535 Gross per 24 hour  Intake --  Output 1350 ml  Net -1350 ml        Physical Exam: Vital Signs Blood pressure (!) 155/115, pulse 99, temperature 98.4 F (36.9 C), temperature source Oral, resp. rate 17, height 5\' 7"  (1.702 m), weight 68.4 kg, SpO2 97%.  Physical Exam Vitals and nursing note reviewed.  Constitutional:      Appearance: Normal appearance.  HENT:     Head: Normocephalic.     Right Ear: External ear normal.     Left Ear: External ear normal.     Nose: Nose normal.     Mouth/Throat:     Comments: Missing numerous teeth Eyes:     Pupils: Pupils are equal, round, and reactive to light.  Cardiovascular:     Rate and Rhythm: Normal rate and regular rhythm.     Pulses: Normal pulses.     Heart sounds: No murmur heard.    No gallop.  Pulmonary:     Effort: Pulmonary effort is normal. No respiratory distress.     Breath sounds: Normal breath sounds.  Abdominal:     General: Bowel sounds are normal. There is no distension.     Palpations: Abdomen is soft.     Tenderness: There is no abdominal tenderness.  Extremities: no edema  PRIOR EXAMS: Musculoskeletal:        General: No swelling or deformity. Normal range of motion.      Cervical back: Normal range of motion.  Skin:    General: Skin is warm and dry.  Neurological:     Mental Status: He is alert and oriented to person, place, and time.     Comments: Alert and oriented x 3. Normal insight and awareness. Intact Memory. Normal language and speech sl dysarthric. Cranial nerve exam unremarkable except for mild right central 7.. MMT: RUE 4 to 4+/5 prox to distal. LUE 5/5. RLE 2/5 HF, 1+ to 2- KE and 0/5 ADF/PF. LLE 5/5. Sensory exam normal for light touch and pain in all 4 limbs. No limb ataxia or cerebellar signs. No abnormal tone appreciated.  Marland Kitchen       Psychiatric:        Mood and Affect: Mood normal.        Behavior: Behavior normal.  Assessment/Plan: 1. Functional deficits which require 3+ hours  per day of interdisciplinary therapy in a comprehensive inpatient rehab setting. Physiatrist is providing close team supervision and 24 hour management of active medical problems listed below. Physiatrist and rehab team continue to assess barriers to discharge/monitor patient progress toward functional and medical goals  Care Tool:  Bathing              Bathing assist       Upper Body Dressing/Undressing Upper body dressing        Upper body assist      Lower Body Dressing/Undressing Lower body dressing            Lower body assist       Toileting Toileting    Toileting assist       Transfers Chair/bed transfer  Transfers assist           Locomotion Ambulation   Ambulation assist              Walk 10 feet activity   Assist           Walk 50 feet activity   Assist           Walk 150 feet activity   Assist           Walk 10 feet on uneven surface  activity   Assist           Wheelchair     Assist               Wheelchair 50 feet with 2 turns activity    Assist            Wheelchair 150 feet activity     Assist          Blood pressure (!) 155/115, pulse 99,  temperature 98.4 F (36.9 C), temperature source Oral, resp. rate 17, height 5\' 7"  (1.702 m), weight 68.4 kg, SpO2 97%.  Medical Problem List and Plan: 1. 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 Will likely need AFO for gait  -05/01/23 CIR evals today 2.  Antithrombotics: -DVT/anticoagulation:  Pharmaceutical: Lovenox 40mg  QD             -antiplatelet therapy: ASA/Plavix DAPT X 3 months followed by ASA alone.  3. Pain Management: Tylenol prn mild and ultram prn moderate pain             --continue Lidoderm patches for local measure.  4. Mood/Behavior/Sleep: LCSW to follow for evaluation and support.              -antipsychotic agents: N/A  -Trazodone PRN 5. Neuropsych/cognition: This patient is capable of making decisions on his own behalf. 6. Skin/Wound Care: Routine pressure relief measures.  7. Fluids/Electrolytes/Nutrition: Monitor I/O. Monitor labs  -05/01/23 Labs fairly unremarkable except Cr 1.42, encourage PO fluids 8. HTN: Monitor BP TID, continue Amlodipine 10mg  daily, Cozaar 100mg  daily, Metoprolol 25mg  BID             -BP poorly controlled at present             -add hydralazine 10mg  q6h PRN for BP > 199/99  -05/01/23 BPs variable, but overall improving, monitor for trend Vitals:   04/30/23 1600  04/30/23 1936 05/01/23 0533 05/01/23 0803  BP: (!) 144/99 (!) 137/101 (!) 148/119 (!) 155/115   05/01/23 0827  BP: (!) 160/104    9. CAD: Monitor for symptoms with increase in activity.  --daily wts. Monitor for signs of overload.              -- On Cozaar, metoprolol, Crestor 40mg  QD  Filed Weights   04/30/23 1600  Weight: 68.4 kg    10 Pre-renal azotemia: Encourage fluid intake, Cr 1.42 on 05/01/23.  11. Tobacco dependence: Continue nicotine patch 21mg . Changed to daily rather than PRN, per request             --educate on  importance of tobacco cessation 12. Aortoiliac disease w/ occluded Left CIA/infrainguinal dz:  NO rest pain. Continue ASA and Lipitor.              --smoking cessation.  --follow up with Dr. Randie Heinz after discharge 13. Suboptimal Vitamin D: 05/01/23 level 26.64, will start supplementation 50000U weekly x7wks  LOS: 1 days A FACE TO FACE EVALUATION WAS PERFORMED  [address] 05/01/2023, 8:09 AM

## 2023-05-01 NOTE — Plan of Care (Signed)
  Problem: Consults Goal: RH STROKE PATIENT EDUCATION Description: See Patient Education module for education specifics  Outcome: Progressing   Problem: RH SAFETY Goal: RH STG ADHERE TO SAFETY PRECAUTIONS W/ASSISTANCE/DEVICE Description: STG Adhere to Safety Precautions With cues Assistance/Device. Outcome: Progressing   Problem: RH PAIN MANAGEMENT Goal: RH STG PAIN MANAGED AT OR BELOW PT'S PAIN GOAL Description: < 4 with prns Outcome: Progressing   Problem: RH KNOWLEDGE DEFICIT Goal: RH STG INCREASE KNOWLEDGE OF HYPERTENSION Description: Patient and significant other will be able to manage HTN with medications and dietary modifications using educational resources independently Outcome: Progressing Goal: RH STG INCREASE KNOWLEDGE OF STROKE PROPHYLAXIS Description: Patient and significant other will be able to manage secondary stroke risks with medications and dietary modifications using educational resources independently Outcome: Progressing

## 2023-05-01 NOTE — Evaluation (Signed)
Speech Language Pathology Assessment and Plan  Patient Details  Name: Lance Santiago MRN: [id number] Date of Birth: 06/23/1961  SLP Diagnosis: Cognitive Impairments  Rehab Potential: Good ELOS: 10-12 days    Today's Date: 05/01/2023 SLP Individual Time: 4782-9562 SLP Individual Time Calculation (min): 60 min   Hospital Problem: Principal Problem:   Acute ischemic left ACA stroke [facility])  Past Medical History:  Past Medical History:  Diagnosis Date   Coronary artery disease    Hyperlipidemia    Hypertension    Lumbar disc disease    Myocardial infarction (HCC)    9/18 PCI/DESx1 to RCA, EF 45%   Smoking    Systolic heart failure (HCC)    Past Surgical History:  Past Surgical History:  Procedure Laterality Date   CARDIAC CATHETERIZATION     CORONARY/GRAFT ACUTE MI REVASCULARIZATION N/A 04/25/2017   Procedure: Coronary/Graft Acute MI Revascularization;  Surgeon: Runell Gess, MD;  Location: [facility] INVASIVE CV LAB;  Service: Cardiovascular;  Laterality: N/A;   KNEE SURGERY     LEFT HEART CATH AND CORONARY ANGIOGRAPHY N/A 04/25/2017   Procedure: LEFT HEART CATH AND CORONARY ANGIOGRAPHY;  Surgeon: Runell Gess, MD;  Location: [facility] INVASIVE CV LAB;  Service: Cardiovascular;  Laterality: N/A;   LUMBAR LAMINECTOMY      Assessment / Plan / Recommendation Clinical Impression   Per chart review:  Lance Santiago is a 62 year old male with history of CAD, HTN, Systolic CHF, tobacco use- I PPD who was admitted on 04/26/23 with right sided weakness and coolness RLE. CTA head was negative for LVO and showed severe stenosis distal R-P3 segment and left P1 segment, tiny 2 mm aneurysm and carotid bifurcation showed 70% stenosis proximal R-ICA and 50% stenosis proximal L-ICA. 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 UDS positive for THC.  MRI brain showed L-ACA territory infarct. 2D echo showed EF 50-55% with now all abnormality and no shunt.  Patient noted to have RUE/RLE weakness which is resolving.   Elevated BP treated with titration of meds. Dr. Roda Shutters felt that stroke likely due to small vessel disease and recs for DAPT X 3 months followed by ASA indefinitely. Dr. Randie Heinz consulted for input and recommended outpatient follow up on PAD as patient with palpable right PT pulse and able to ambulate 2 blocks prior to symptoms of claudication. Carotid  dopplers ordered  showing 40-59% R-ICA stenosis and 1-39% L-ICA stenosis. Patient was educated on importance of smoking cessation by vascular and to follow up in office for work up. Therapy has been working with patient who requires cues for BOS and RLE weakness with mobility,  assistance with mobility as well as min assist with ADLs.  CIR recommended due to functional decline.    Cognitive-Communication: Patient was seen for skilled ST evaluation of cognitive-communication.  Patient was sitting in chair upon SLP arrival and was agreeable to ST assessment.  Patient was educated on SLP role in inpatient rehabilitation. Patient reported he had attention difficulty at baseline, but has never been diagnosed.  Patient was noted to be impulsive throughout SLP evaluation, rushing through tasks and making careless errors.   SLP administered portions of CLQT. See below:   Cognitive  Linguistic Quick Test: AGE - 18 - 69   The Cognitive Linguistic Quick Test (CLQT) was administered to assess the relative status of five cognitive domains: attention, memory, language, executive functioning, and visuospatial skills. Scores from 10 tasks were used to estimate severity ratings  (standardized for age groups 18-69 years and 70-89 years) for each domain, a clock drawing task, as well as an overall composite severity rating of cognition.       Task Score Criterion Cut Scores  Personal Facts 8/8 8  Symbol Cancellation 8/12 11  Confrontation Naming 10/10 10  Clock Drawing  13/13 12  Story Retelling 6/10 6  Symbol Trails 10/10 9  Generative Naming 4/9 5  Design Memory Not completed 5  Mazes  8/8 7  Design Generation Not completed 6   SLP briefly reviewed results with patient.  Patient was surprised by the errors he made.  SLP explained how attention can impact memory, and how stroke can impact awareness of impairments.  SLP recommends intervention for awareness, attention and memory.  Patient would benefit from strategy education.     Skilled Therapeutic Interventions          Cognitive-communication evaluation.    SLP Assessment  Patient will need skilled Speech Lanaguage Pathology Services during CIR admission    Recommendations  Patient destination: Home Follow up Recommendations: Outpatient SLP    SLP Frequency 3 to 5 out of 7 days   SLP Duration  SLP Intensity  SLP Treatment/Interventions 10-12 days     Cognitive remediation/compensation;Internal/external aids;Cueing hierarchy;Therapeutic Activities;Environmental controls;Functional tasks;Patient/family education    Pain Pain Assessment Pain Scale: 0-10 Pain Score: 0-No pain Multiple Pain Sites: No  Prior Functioning Cognitive/Linguistic Baseline: Within functional limits (Reports attention deficit at baseline (no formal dx)) Type of Home: House  Lives With: Significant other;Son Available Help at Discharge: Family;Available PRN/intermittently Vocation: Unemployed  SLP Evaluation Cognition Overall Cognitive Status: No family/caregiver present to determine baseline cognitive functioning Arousal/Alertness: Awake/alert Orientation Level: Oriented X4 Year: 2024 Month: September Day of  Week: Correct Attention: Alternating;Divided Focused Attention: Impaired Focused Attention Impairment: Verbal complex Selective Attention: Impaired Selective Attention Impairment: Verbal complex Alternating Attention: Impaired Divided Attention: Impaired Memory: Impaired Memory Impairment: Decreased short term memory;Decreased recall of new information;Retrieval deficit Decreased Short Term Memory: Verbal complex;Functional complex Awareness: Appears intact Problem Solving: Impaired Problem Solving Impairment: Functional basic Executive Function: Landscape architect: Impaired Organizing Impairment: Verbal complex Self Monitoring: Impaired Self Monitoring Impairment: Functional basic Behaviors: Impulsive Safety/Judgment: Appears intact  Comprehension Auditory Comprehension Overall Auditory Comprehension: Appears within functional limits for tasks assessed Interfering Components: Attention Expression Expression Primary Mode of Expression: Verbal Verbal Expression Overall Verbal Expression: Appears within functional limits for tasks assessed Written Expression Dominant Hand: Right Oral Motor Oral Motor/Sensory Function Overall Oral Motor/Sensory Function: Within functional limits Motor Speech Overall Motor Speech: Appears within functional limits for tasks assessed Respiration: Within functional limits Phonation: Normal Resonance: Within functional limits Articulation: Within functional limitis Intelligibility: Intelligible Motor Planning: Witnin functional limits Motor Speech Errors: Not applicable  Care Tool Care Tool Cognition Ability to hear (with hearing aid or hearing appliances if normally used Ability to hear (with hearing aid or hearing appliances if normally used): 0. Adequate - no difficulty in normal conservation, social interaction, listening to TV   Expression of Ideas and Wants Expression of Ideas and Wants: 4. Without difficulty (complex and basic) -  expresses complex messages without difficulty and with speech that is clear and easy to understand   Understanding Verbal and  Non-Verbal Content Understanding Verbal and Non-Verbal Content: 4. Understands (complex and basic) - clear comprehension without cues or repetitions  Memory/Recall Ability Memory/Recall Ability : Current season;That he or she is in a hospital/hospital unit     Short Term Goals: Week 1: SLP Short Term Goal 1 (Week 1): Pt will recall 2 areas of cognitive impairment with minA verbal cues. SLP Short Term Goal 2 (Week 1): Pt will recall 2 internal and 2 external memory strategies for recall of important information with minA verbal cues. SLP Short Term Goal 3 (Week 1): Pt will recall 2+ strategies to compensate for attention impairment with minA verbal cues.  Refer to Care Plan for Long Term Goals  Recommendations for other services: None   Discharge Criteria: Patient will be discharged from SLP if patient refuses treatment 3 consecutive times without medical reason, if treatment goals not met, if there is a change in medical status, if patient makes no progress towards goals or if patient is discharged from hospital.  The above assessment, treatment plan, treatment alternatives and goals were discussed and mutually agreed upon: by patient  Ann Lions 05/01/2023, 2:09 PM

## 2023-05-02 DIAGNOSIS — I10 Essential (primary) hypertension: Secondary | ICD-10-CM | POA: Diagnosis not present

## 2023-05-02 DIAGNOSIS — I63522 Cerebral infarction due to unspecified occlusion or stenosis of left anterior cerebral artery: Secondary | ICD-10-CM | POA: Diagnosis not present

## 2023-05-02 DIAGNOSIS — R7989 Other specified abnormal findings of blood chemistry: Secondary | ICD-10-CM | POA: Diagnosis not present

## 2023-05-02 DIAGNOSIS — F17210 Nicotine dependence, cigarettes, uncomplicated: Secondary | ICD-10-CM | POA: Diagnosis not present

## 2023-05-02 NOTE — Progress Notes (Signed)
Speech Language Pathology Daily Session Note  Patient Details  Name: Lance Santiago MRN: [id number] Date of Birth: 20-Feb-1961  Today's Date: 05/02/2023 SLP Individual Time: 1110-1205 SLP Individual Time Calculation (min): 55 min  Short Term Goals: Week 1: SLP Short Term Goal 1 (Week 1): Pt will recall 2 areas of cognitive impairment with minA verbal cues. SLP Short Term Goal 2 (Week 1): Pt will recall 2 internal and 2 external memory strategies for recall of important information with minA verbal cues. SLP Short Term Goal 3 (Week 1): Pt will recall 2+ strategies to compensate for attention impairment with minA verbal cues.  Skilled Therapeutic Interventions:  Pt was seen for skilled ST targeting cognitive goals.  Pt was awake, alert, and agreeable to participating in treatment.  Pt again endorses baseline attention deficits without a formal diagnosis of ADD/ADHD.  He specifically mentions racing thoughts and feeling restless if he's not doing multiple things at one time.  SLP facilitated the session with medication management tasks to address attention, memory, and problem solving goals.  Pt could identify function of medication when named in 100% of opportunities with no more than supervision cues for recall.  Pt then organized medications into a twice daily pill box for 100% accuracy with mod I, demonstrating appropriate task organization, attention to task, and error awareness. Pt reports that he feels he is at his baseline for cognition.  Pt was left in recliner with chair alarm set and call bell within reach.  Continue per current plan of care.    Pain Pain Assessment Pain Scale: 0-10 Pain Score: 5  Pain Location: Back Pain Orientation: Lower Pain Intervention(s): Medication (See eMAR)  Therapy/Group: Individual Therapy  Sharese Manrique, Melanee Spry 05/02/2023, 11:57 AM

## 2023-05-02 NOTE — Progress Notes (Signed)
Occupational Therapy Session Note  Patient Details  Name: Lance Santiago MRN: [id number] Date of Birth: April 18, 1961  Today's Date: 05/02/2023 OT Individual Time: 9629-5284 OT Individual Time Calculation (min): 60 min    Short Term Goals: Week 1:  OT Short Term Goal 1 (Week 1): Pt will complete toilet/shower transfers with CGA using LRAD OT Short Term Goal 2 (Week 1): Pt will complete LB dressing with CGA using modfied techniques PRN and LRAD OT Short Term Goal 3 (Week 1): Pt wil recall hemi-dressing technique with min questioning cues OT Short Term Goal 4 (Week 1): Pt complete toileting wiht CGA  Skilled Therapeutic Interventions/Progress Updates:    Pt resting in recliner upon arrival. Pt declined bathing this morning but did agree to chaning clothing. Pt requested paper scrubs. Dressing with supervision. Pt amb with RW to bathroom with CGA. Toileting with supervision. Pt amb with RW to day room. Standing balance activity tossing bean bags for [employer]. Focus on RUE arm swing and release. Pt required rest break x 1 2/2 back pain. Pt stood at table and cleaned bean bags with BUE. Pt returned to room and remained in recliner. Belt alarm activated. All needs within reach.   Therapy Documentation Precautions:  Precautions Precautions: Fall, Other (comment) Precaution Comments: BP <180/105 Restrictions Weight Bearing Restrictions: No Pain: Pt reports back discomfort with activity; repositined   Therapy/Group: Individual Therapy  Rich Brave 05/02/2023, 10:17 AM

## 2023-05-02 NOTE — Progress Notes (Signed)
PROGRESS NOTE   Subjective/Complaints:  Pt doing well again today. Slept ok, about the same as his usual sleep. Pain well controlled. LBM this morning and a couple times yesterday. Urinating fine. Denies any other complaints or concerns today.   ROS: as per HPI. Denies CP, SOB, abd pain, N/V/D/C, or any other complaints at this time.    Objective:   No results found. Recent Labs    04/30/23 0618 05/01/23 0551  WBC 9.1 8.4  HGB 16.7 16.8  HCT 51.3 49.4  PLT 204 221   Recent Labs    04/30/23 0618 05/01/23 0551  NA 136 133*  K 4.5 4.6  CL 100 98  CO2 26 25  GLUCOSE 91 93  BUN 22* 27*  CREATININE 1.21 1.42*  CALCIUM 9.5 9.5     Intake/Output Summary (Last 24 hours) at 05/02/2023 0905 Last data filed at 05/01/2023 2200 Gross per 24 hour  Intake 364 ml  Output --  Net 364 ml        Physical Exam: Vital Signs Blood pressure (!) 166/104, pulse 85, temperature 98.3 F (36.8 C), temperature source Oral, resp. rate 18, height 5\' 7"  (1.702 m), weight 65.8 kg, SpO2 96%.  Physical Exam Vitals and nursing note reviewed.  Constitutional:      Appearance: Normal appearance. Sitting in bedside chair HENT:     Head: Normocephalic.     Right Ear: External ear normal.     Left Ear: External ear normal.     Nose: Nose normal.     Mouth/Throat:     Comments: Missing numerous teeth Eyes:     Pupils: Pupils are equal, round, and reactive to light.  Cardiovascular:     Rate and Rhythm: Normal rate and regular rhythm.     Pulses: Normal pulses.     Heart sounds: No murmur heard.    No gallop.  Pulmonary:     Effort: Pulmonary effort is normal. No respiratory distress.     Breath sounds: Normal breath sounds.  Abdominal:     General: Bowel sounds are normal. There is no distension.     Palpations: Abdomen is soft.     Tenderness: There is no abdominal tenderness.  Extremities: no edema MsK: holding phone without  difficulty, able to lift both legs but RLE weaker  PRIOR EXAMS: Musculoskeletal:        General: No swelling or deformity. Normal range of motion.     Cervical back: Normal range of motion.  Skin:    General: Skin is warm and dry.  Neurological:     Mental Status: He is alert and oriented to person, place, and time.     Comments: Alert and oriented x 3. Normal insight and awareness. Intact Memory. Normal language and speech sl dysarthric. Cranial nerve exam unremarkable except for mild right central 7.. MMT: RUE 4 to 4+/5 prox to distal. LUE 5/5. RLE 2/5 HF, 1+ to 2- KE and 0/5 ADF/PF. LLE 5/5. Sensory exam normal for light touch and pain in all 4 limbs. No limb ataxia or cerebellar signs. No abnormal tone appreciated.  Marland Kitchen       Psychiatric:  Mood and Affect: Mood normal.        Behavior: Behavior normal.  Assessment/Plan: 1. Functional deficits which require 3+ hours per day of interdisciplinary therapy in a comprehensive inpatient rehab setting. Physiatrist is providing close team supervision and 24 hour management of active medical problems listed below. Physiatrist and rehab team continue to assess barriers to discharge/monitor patient progress toward functional and medical goals  Care Tool:  Bathing    Body parts bathed by patient: Right arm, Left arm, Chest, Abdomen, Front perineal area, Right upper leg, Left upper leg, Face, Right lower leg, Left lower leg   Body parts bathed by helper: Buttocks     Bathing assist Assist Level: Minimal Assistance - Patient > 75%     Upper Body Dressing/Undressing Upper body dressing   What is the patient wearing?: Pull over shirt    Upper body assist Assist Level: Supervision/Verbal cueing    Lower Body Dressing/Undressing Lower body dressing      What is the patient wearing?: Pants, Underwear/pull up     Lower body assist Assist for lower body dressing: Moderate Assistance - Patient 50 - 74%     Toileting Toileting     Toileting assist Assist for toileting: Minimal Assistance - Patient > 75%     Transfers Chair/bed transfer  Transfers assist     Chair/bed transfer assist level: Minimal Assistance - Patient > 75%     Locomotion Ambulation   Ambulation assist      Assist level: Minimal Assistance - Patient > 75% Assistive device: No Device Max distance: 200   Walk 10 feet activity   Assist     Assist level: Minimal Assistance - Patient > 75% Assistive device: No Device   Walk 50 feet activity   Assist    Assist level: Minimal Assistance - Patient > 75% Assistive device: No Device    Walk 150 feet activity   Assist    Assist level: Minimal Assistance - Patient > 75% Assistive device: No Device    Walk 10 feet on uneven surface  activity   Assist     Assist level: Minimal Assistance - Patient > 75%     Wheelchair     Assist Is the patient using a wheelchair?: Yes Type of Wheelchair: Manual    Wheelchair assist level: Minimal Assistance - Patient > 75% Max wheelchair distance: 150 ft    Wheelchair 50 feet with 2 turns activity    Assist        Assist Level: Minimal Assistance - Patient > 75%   Wheelchair 150 feet activity     Assist      Assist Level: Minimal Assistance - Patient > 75%   Blood pressure (!) 166/104, pulse 85, temperature 98.3 F (36.8 C), temperature source Oral, resp. rate 18, height 5\' 7"  (1.702 m), weight 65.8 kg, SpO2 96%.  Medical Problem List and Plan: 1. 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 Will likely need AFO for gait  -Continue CIR 2.  Antithrombotics: -DVT/anticoagulation:  Pharmaceutical: Lovenox 40mg  QD             -antiplatelet therapy: ASA/Plavix DAPT X 3 months followed by ASA alone.  3. Pain Management: Tylenol prn mild and ultram prn moderate  pain             --continue Lidoderm patches for local measure.  4. Mood/Behavior/Sleep: LCSW to follow for evaluation and support.              -antipsychotic agents: N/A  -Trazodone PRN 5. Neuropsych/cognition: This patient is capable of making decisions on his own behalf. 6. Skin/Wound Care: Routine pressure relief measures.  7. Fluids/Electrolytes/Nutrition: Monitor I/O. Monitor labs  -05/01/23 Labs fairly unremarkable except Cr 1.42, encourage PO fluids 8. HTN: Monitor BP TID, continue Amlodipine 10mg  daily, Cozaar 100mg  daily, Metoprolol 25mg  BID             -BP poorly controlled at present             -add hydralazine 10mg  q6h PRN for BP > 199/99  -05/01/23 BPs variable, but overall improving, monitor for trend -05/02/23 BPs still variable, overall rising a bit, got hydralazine once last night and once this morning; monitor trend, may need further adjustments Vitals:   04/30/23 1600 04/30/23 1936 05/01/23 0533 05/01/23 0803  BP: (!) 144/99 (!) 137/101 (!) 148/119 (!) 155/115   05/01/23 0827 05/01/23 2034 05/01/23 2315 05/02/23 0046  BP: (!) 160/104 (!) 149/101 (!) 147/101 (!) 148/107   05/02/23 0459 05/02/23 0525 05/02/23 0636  BP: (!) 173/115 (!) 161/106 (!) 166/104    9. CAD: Monitor for symptoms with increase in activity.  --daily wts. Monitor for signs of overload.              -- On Cozaar, metoprolol, Crestor 40mg  QD  -05/02/23 wt a little down, monitor Filed Weights   04/30/23 1600 05/02/23 0500  Weight: 68.4 kg 65.8 kg    10 Pre-renal azotemia: Encourage fluid intake, Cr 1.42 on 05/01/23.  11. Tobacco dependence: Continue nicotine patch 21mg . Changed to daily rather than PRN, per request             --educate on importance of tobacco cessation 12. Aortoiliac disease w/ occluded Left CIA/infrainguinal dz:  NO rest pain. Continue ASA and Lipitor.              --smoking cessation.  --follow up with Dr. Randie Heinz after discharge 13. Suboptimal Vitamin D: 05/01/23 level 26.64,  will start supplementation 50000U weekly x7wks  LOS: 2 days A FACE TO FACE EVALUATION WAS PERFORMED  [address] 05/02/2023, 9:05 AM

## 2023-05-02 NOTE — Progress Notes (Signed)
Physical Therapy Session Note  Patient Details  Name: Lance Santiago MRN: [id number] Date of Birth: July 03, 1961  Today's Date: 05/02/2023 PT Individual Time: 8469-6295 PT Individual Time Calculation (min): 74 min   Short Term Goals: Week 1:  PT Short Term Goal 1 (Week 1): Pt will be able to STS w/ CGA using LRAD PT Short Term Goal 2 (Week 1): Pt will be able to perform bed to chair w/ CGA using LRAD PT Short Term Goal 3 (Week 1): Pt will ambulate 300 ft CGA w/ LRAD PT Short Term Goal 4 (Week 1): Pt perform 12 stairs CGA w/ LRAD  Skilled Therapeutic Interventions/Progress Updates:      Pt seated in recliner upon arrival. Pt agreeable to therapy. Pt denies any pain.   Resting BP 137/86 HR 74, after gait 140/90, after sit to stand x10  BP 160/97, seated rest BP 125/98, 144/88 after toe taps and bridges, 139/98 with lateral stepping and therex.   Pt ambulated with no AD with min A from room to main gym, with R LE ace bandage DF assist, verbal and tactile cues provided for R LE heel strike, increased step length and increased R LE foot clearance.   Pt performed sit to stand x10 with no AD with airex pad under L LE with arm across chest with CGA, to facilitate imrpoved weight bearing through R LE as pt heavily favors L LE without cuing.   Pt attempted navigated of 3 inch hurdles leading with R LE with R HHA verbal cues provided for hi p and knee flexion, but pt unable to clear it. Transitioned to toe taps R LE toe taps on 3inch steps with B HR, pt able to place R LE onto step but requires significant forwardtrunk lean compensation to move foot posteriorly off step. Pt performed glute bridge x3 with full buttock clearance, unable to tolerate 2/2 back pain.   Attempted backwards walking and standing hip extension in // bars pt unable to extend hip beyond neutral. Performed thomas test, pt demos mild R LE hip flexor tightness,, therapist stretched hip flexors 1x2 min while providing apprxomation  to R knee with simultaneous knee flexion over EOM. Pt reports pain relief in this position.   Pt performed lateral stepping in // bars with verbal and tactile cues provided for upright posture, pt demos significant hip flexion compenstation for advancement of R LE into adduction and abduction.   Pt requesting to use bathroom. Pt ambualted with no AD to room with same cues as described above. Pt performed ambulatory transfer to toilet with CGA. Pt continent of bowel and  bladder, pt performed pericare with supervision while seated and donned/doffed pants while standing with CGA.   Pt seated in recliner with all needs within reach and seatbelt alarm on.       Therapy Documentation Precautions:  Precautions Precautions: Fall, Other (comment) Precaution Comments: BP <180/105 Restrictions Weight Bearing Restrictions: No  Therapy/Group: Individual Therapy  [facility] [place], [place], DPT  05/02/2023, 7:52 AM

## 2023-05-02 NOTE — Progress Notes (Signed)
Occupational Therapy Session Note  Patient Details  Name: Lance Santiago MRN: [id number] Date of Birth: [birth year]  {CHL IP REHAB OT TIME CALCULATIONS:[id number]}   Short Term Goals: Week 1:  OT Short Term Goal 1 (Week 1): Pt will complete toilet/shower transfers with CGA using LRAD OT Short Term Goal 2 (Week 1): Pt will complete LB dressing with CGA using modfied techniques PRN and LRAD OT Short Term Goal 3 (Week 1): Pt wil recall hemi-dressing technique with min questioning cues OT Short Term Goal 4 (Week 1): Pt complete toileting wiht CGA  Skilled Therapeutic Interventions/Progress Updates:    Patient agreeable to participate in OT session. Reports *** pain level.   Patient participated in skilled OT session focusing on ***. Therapist facilitated/assessed/developed/educated/integrated/elicited *** in order to improve/facilitate/promote    Therapy Documentation Precautions:  Precautions Precautions: Fall, Other (comment) Precaution Comments: BP <180/105 Restrictions Weight Bearing Restrictions: No   Therapy/Group: Individual Therapy  Limmie Patricia, OTR/L,CBIS  Supplemental OT - [facility] Secure Chat Preferred   05/02/2023, 6:44 PM

## 2023-05-03 DIAGNOSIS — I63522 Cerebral infarction due to unspecified occlusion or stenosis of left anterior cerebral artery: Secondary | ICD-10-CM | POA: Diagnosis not present

## 2023-05-03 LAB — CBC
HCT: 51.8 % (ref 39.0–52.0)
Hemoglobin: 17.1 g/dL — ABNORMAL HIGH (ref 13.0–17.0)
MCH: 29.6 pg (ref 26.0–34.0)
MCHC: 33 g/dL (ref 30.0–36.0)
MCV: 89.8 fL (ref 80.0–100.0)
Platelets: 239 10*3/uL (ref 150–400)
RBC: 5.77 MIL/uL (ref 4.22–5.81)
RDW: 14.1 % (ref 11.5–15.5)
WBC: 8.1 10*3/uL (ref 4.0–10.5)
nRBC: 0 % (ref 0.0–0.2)

## 2023-05-03 LAB — BASIC METABOLIC PANEL
Anion gap: 10 (ref 5–15)
BUN: 29 mg/dL — ABNORMAL HIGH (ref 6–20)
CO2: 22 mmol/L (ref 22–32)
Calcium: 9.2 mg/dL (ref 8.9–10.3)
Chloride: 99 mmol/L (ref 98–111)
Creatinine, Ser: 1.33 mg/dL — ABNORMAL HIGH (ref 0.61–1.24)
GFR, Estimated: >60 mL/min (ref >60)
Glucose, Bld: 88 mg/dL (ref 70–99)
Potassium: 4.4 mmol/L (ref 3.5–5.1)
Sodium: 131 mmol/L — ABNORMAL LOW (ref 135–145)

## 2023-05-03 MED ORDER — MAGNESIUM GLUCONATE 500 MG PO TABS
250.0000 mg | ORAL_TABLET | Freq: Every day | ORAL | Status: DC
  Administered 2023-05-03: 250 mg via ORAL
  Filled 2023-05-03: qty 1

## 2023-05-03 NOTE — Progress Notes (Signed)
Physical Therapy Session Note  Patient Details  Name: Lance Santiago MRN: [id number] Date of Birth: Feb 06, 1961  Today's Date: 05/03/2023 PT Individual Time: 3244-0102 PT Individual Time Calculation (min): 71 min   Short Term Goals: Week 1:  PT Short Term Goal 1 (Week 1): Pt will be able to STS w/ CGA using LRAD PT Short Term Goal 2 (Week 1): Pt will be able to perform bed to chair w/ CGA using LRAD PT Short Term Goal 3 (Week 1): Pt will ambulate 300 ft CGA w/ LRAD PT Short Term Goal 4 (Week 1): Pt perform 12 stairs CGA w/ LRAD  Skilled Therapeutic Interventions/Progress Updates:   Received pt sitting in recliner returning from bathroom with NT - PT took over with care. Pt agreeable to PT treatment and reported chronic pain 3/10 in low back - RN notified and administered Lidocaine patch. Session with emphasis on BP management, functional mobility/transfers, generalized strengthening and endurance, dynamic standing balance/coordination, and gait training.   Assessed BP and numbers high this morning - orders to keep BP <180/105 - RN and MD notified and present for morning rounds. MD cleared pt to continue with therapy but BP monitored throughout. Pt performed all transfers without AD and CGA/min A throughout session. Donned R DF ace wrap and requested smaller size PRAFO from MD. Pt ambulated 12ft x 2 trials without AD and CGA/light min A to/from dayroom. Stepped on/off Litegait with CGA, donned Litegait harness in standing with mod A, and worked on Investment banker, operational on LiteGait for the following time frames: - Trial 1: 568ft forwards with BUE support at 0.29mph for 7 minutes. Attempted to decrease to 1UE support, however pt unable to keep up with pace and felt more balance with BUE support. Progressed to stepping over beanbags with emphasis on R foot clearance. Pt limited by fatigue in LLE not RLE.  - Trial 2:186ft retro walking with BUE support at 0. for 7 minutes and 5 seconds. Pt required min  A to advance RLE and prevent very mild hyperextension Returned to room and concluded session with pt sitting in recliner, needs within reach, and seatbelt alarm on.   Vitals (pt asymptomatic): BP sitting in recliner: 163/111 Took extended rest break then reassessed: 147/112 BP standing: 152/110  BP after ambulating to dayroom: 150/96  BP after ambulating on Litegait Trial 1: 157/100 BP after ambulating on Litegait Trial 2: 147/103 BP sitting in recliner at end of session: 156/103  Therapy Documentation Precautions:  Precautions Precautions: Fall, Other (comment) Precaution Comments: BP <180/105 Restrictions Weight Bearing Restrictions: No  Therapy/Group: Individual Therapy Marlana Salvage Zaunegger Blima Rich PT, DPT 05/03/2023, 6:59 AM

## 2023-05-03 NOTE — Progress Notes (Signed)
Speech Language Pathology Daily Session Note  Patient Details  Name: Lance Santiago MRN: [id number] Date of Birth: 1961/01/08  Today's Date: 05/03/2023 SLP Individual Time: 1100-1145 SLP Individual Time Calculation (min): 45 min  Short Term Goals: Week 1: SLP Short Term Goal 1 (Week 1): Pt will recall 2 areas of cognitive impairment with minA verbal cues. SLP Short Term Goal 2 (Week 1): Pt will recall 2 internal and 2 external memory strategies for recall of important information with minA verbal cues. SLP Short Term Goal 3 (Week 1): Pt will recall 2+ strategies to compensate for attention impairment with minA verbal cues.  Skilled Therapeutic Interventions: SLP conducted skilled therapy session targeting cognitive retraining goals. Patient oriented x4, recalling prior daily events, recommendations for safe transfers, and specific remaining deficits with modI. Prior to task initiation, patient requested assistance to commode where he was continent of bladder. Patient independently reports attention deficits at baseline and provided SLP with 2 strategies used to compensate for impairment prior to admission. Guided patient through functional attention activity with patient completing with supervisionA. He then self-corrected most mistakes requiring minA to identify remaining 2. Patient completed medication error identification task with modI. Patient was left in chair with call bell in reach and chair alarm set. SLP will continue to target goals per plan of care.      Pain Pain Assessment Pain Scale: 0-10 Pain Score: 0-No pain  Therapy/Group: Individual Therapy  Jeannie Done, M.A., CCC-SLP  Yetta Barre 05/03/2023, 12:55 PM

## 2023-05-03 NOTE — Progress Notes (Signed)
PROGRESS NOTE   Subjective/Complaints: Working with Tobi Bastos in therapy gym Large PRAFO is too large, medium ordered D/c IV Discussed labs are stable  ROS: as per HPI. Denies CP, SOB, abd pain, N/V/D/C, or any other complaints at this time.    Objective:   No results found. Recent Labs    05/01/23 0551 05/03/23 0641  WBC 8.4 8.1  HGB 16.8 17.1*  HCT 49.4 51.8  PLT 221 239   Recent Labs    05/01/23 0551 05/03/23 0641  NA 133* 131*  K 4.6 4.4  CL 98 99  CO2 25 22  GLUCOSE 93 88  BUN 27* 29*  CREATININE 1.42* 1.33*  CALCIUM 9.5 9.2     Intake/Output Summary (Last 24 hours) at 05/03/2023 1126 Last data filed at 05/03/2023 0719 Gross per 24 hour  Intake 478 ml  Output --  Net 478 ml        Physical Exam: Vital Signs Blood pressure (!) 147/112, pulse 77, temperature 98 F (36.7 C), temperature source Oral, resp. rate 17, height 5\' 7"  (1.702 m), weight 65.1 kg, SpO2 97%.  Physical Exam Vitals and nursing note reviewed. BMI 22.48 Constitutional:      Appearance: Normal appearance. Sitting in bedside chair HENT:     Head: Normocephalic.     Right Ear: External ear normal.     Left Ear: External ear normal.     Nose: Nose normal.     Mouth/Throat:     Comments: Missing numerous teeth Eyes:     Pupils: Pupils are equal, round, and reactive to light.  Cardiovascular:     Rate and Rhythm: Normal rate and regular rhythm.     Pulses: Normal pulses.     Heart sounds: No murmur heard.    No gallop.  Pulmonary:     Effort: Pulmonary effort is normal. No respiratory distress.     Breath sounds: Normal breath sounds.  Abdominal:     General: Bowel sounds are normal. There is no distension.     Palpations: Abdomen is soft.     Tenderness: There is no abdominal tenderness.  Extremities: no edema MsK: holding phone without difficulty, able to lift both legs but RLE weaker  PRIOR EXAMS: Musculoskeletal:         General: No swelling or deformity. Normal range of motion.     Cervical back: Normal range of motion.  Skin:    General: Skin is warm and dry.  Neurological:     Mental Status: He is alert and oriented to person, place, and time.     Comments: Alert and oriented x 3. Normal insight and awareness. Intact Memory. Normal language and speech sl dysarthric. Cranial nerve exam unremarkable except for mild right central 7.. MMT: RUE 4 to 4+/5 prox to distal. LUE 5/5. RLE 2/5 HF, 1+ to 2- KE and 0/5 ADF/PF. LLE 5/5. Sensory exam normal for light touch and pain in all 4 limbs. No limb ataxia or cerebellar signs. No abnormal tone appreciated.  Marland Kitchen       Psychiatric:        Mood and Affect: Mood normal.        Behavior:  Behavior normal.  Assessment/Plan: 1. Functional deficits which require 3+ hours per day of interdisciplinary therapy in a comprehensive inpatient rehab setting. Physiatrist is providing close team supervision and 24 hour management of active medical problems listed below. Physiatrist and rehab team continue to assess barriers to discharge/monitor patient progress toward functional and medical goals  Care Tool:  Bathing    Body parts bathed by patient: Right arm, Left arm, Chest, Abdomen, Front perineal area, Right upper leg, Left upper leg, Face, Right lower leg, Left lower leg   Body parts bathed by helper: Buttocks     Bathing assist Assist Level: Minimal Assistance - Patient > 75%     Upper Body Dressing/Undressing Upper body dressing   What is the patient wearing?: Pull over shirt    Upper body assist Assist Level: Supervision/Verbal cueing    Lower Body Dressing/Undressing Lower body dressing      What is the patient wearing?: Pants     Lower body assist Assist for lower body dressing: Supervision/Verbal cueing     Toileting Toileting    Toileting assist Assist for toileting: Supervision/Verbal cueing     Transfers Chair/bed transfer  Transfers  assist     Chair/bed transfer assist level: Contact Guard/Touching assist     Locomotion Ambulation   Ambulation assist      Assist level: Contact Guard/Touching assist Assistive device: No Device Max distance: 177ft   Walk 10 feet activity   Assist     Assist level: Contact Guard/Touching assist Assistive device: No Device   Walk 50 feet activity   Assist    Assist level: Contact Guard/Touching assist Assistive device: No Device    Walk 150 feet activity   Assist    Assist level: Minimal Assistance - Patient > 75% Assistive device: No Device    Walk 10 feet on uneven surface  activity   Assist     Assist level: Minimal Assistance - Patient > 75%     Wheelchair     Assist Is the patient using a wheelchair?: Yes Type of Wheelchair: Manual    Wheelchair assist level: Minimal Assistance - Patient > 75% Max wheelchair distance: 150 ft    Wheelchair 50 feet with 2 turns activity    Assist        Assist Level: Minimal Assistance - Patient > 75%   Wheelchair 150 feet activity     Assist      Assist Level: Minimal Assistance - Patient > 75%   Blood pressure (!) 147/112, pulse 77, temperature 98 F (36.7 C), temperature source Oral, resp. rate 17, height 5\' 7"  (1.702 m), weight 65.1 kg, SpO2 97%.  Medical Problem List and Plan: 1. 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 Will likely need AFO for gait  -Continue CIR  D/c IV 2.  Impaired mobility: continue Lovenox 40mg  QD             -antiplatelet therapy: ASA/Plavix DAPT X 3 months followed by ASA alone.  3. Pain Management: Tylenol prn mild and ultram prn moderate pain, kpad ordered             --continue Lidoderm patches for local measure.  4. Mood/Behavior/Sleep:  LCSW to follow for evaluation and support.               -antipsychotic agents: N/A  -Trazodone PRN 5. Neuropsych/cognition: This patient is capable of making decisions on his own behalf. 6. Skin/Wound Care: Routine pressure relief measures.  7. Fluids/Electrolytes/Nutrition: Monitor I/O. Monitor labs  8. HTN: Monitor BP TID, continue Amlodipine 10mg  daily, Cozaar 100mg  daily, Metoprolol 25mg  BID             -BP poorly controlled at present             -add hydralazine 10mg  q6h PRN for BP > 199/99  Start magnesium gluconate 250mg  HS Vitals:   05/01/23 0803 05/01/23 0827 05/01/23 2034 05/01/23 2315  BP: (!) 155/115 (!) 160/104 (!) 149/101 (!) 147/101   05/02/23 0046 05/02/23 0459 05/02/23 0525 05/02/23 0636  BP: (!) 148/107 (!) 173/115 (!) 161/106 (!) 166/104   05/02/23 1247 05/02/23 2029 05/03/23 0451 05/03/23 0700  BP: (!) 132/96 (!) 150/93 (!) 149/85 (!) 147/112    9. CAD: Monitor for symptoms with increase in activity.  --daily wts. Monitor for signs of overload.              -- On Cozaar, metoprolol, Crestor 40mg  QD  -05/02/23 wt a little down, monitor Filed Weights   04/30/23 1600 05/02/23 0500 05/03/23 0634  Weight: 68.4 kg 65.8 kg 65.1 kg    10 Pre-renal azotemia: Encourage fluid intake, Cr 1.42 on 05/01/23.  11. Tobacco dependence: Continue nicotine patch 21mg . Changed to daily rather than PRN, per request             --educate on importance of tobacco cessation 12. Aortoiliac disease w/ occluded Left CIA/infrainguinal dz:  NO rest pain. Continue ASA and Lipitor.              --smoking cessation.  --follow up with Dr. Randie Heinz after discharge  13. Suboptimal Vitamin D: 05/01/23 level 26.64, will continue supplementation 50000U weekly x7wks  LOS: 3 days A FACE TO FACE EVALUATION WAS PERFORMED  Clint Bolder P Keymani Glynn 05/03/2023, 11:26 AM

## 2023-05-03 NOTE — Progress Notes (Signed)
Patient ID: Lance Santiago, male   DOB: 1961/06/20, 62 y.o.   MRN: [id number] Met with the patient to review current situation, rehab process, team conference and plan of care. Discussed risk factors inclduing smoking, PAD/CAD, HTN, HLD (LDL 166/Trib 281); + HTC. Patient notes chronic back pain; herniated discs and fast food diet. 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 Reviewed food choices to select at fast food restaurants and lifestyle modification with smoking cessation.  Patient aware of medications and dietary needs. Working on mA application and PCP once insurance cleared.  Continue to follow along to address educational needs to facilitate preparation for discharge. Pamelia Hoit

## 2023-05-03 NOTE — Progress Notes (Signed)
Inpatient Rehabilitation Center Individual Statement of Services  Patient Name:  Lance Santiago  Date:  05/03/2023  Welcome to the Inpatient Rehabilitation Center.  Our goal is to provide you with an individualized program based on your diagnosis and situation, designed to meet your specific needs.  With this comprehensive rehabilitation program, you will be expected to participate in at least 3 hours of rehabilitation therapies Monday-Friday, with modified therapy programming on the weekends.  Your rehabilitation program will include the following services:  Physical Therapy (PT), Occupational Therapy (OT), Speech Therapy (ST), 24 hour per day rehabilitation nursing, Therapeutic Recreaction (TR), Neuropsychology, Care Coordinator, Rehabilitation Medicine, Nutrition Services, Pharmacy Services, and Other  Weekly team conferences will be held on Wednesdays to discuss your progress.  Your Inpatient Rehabilitation Care Coordinator will talk with you frequently to get your input and to update you on team discussions.  Team conferences with you and your family in attendance may also be held.  Expected length of stay: 10-12 Days  Overall anticipated outcome:  Supervision  Depending on your progress and recovery, your program may change. Your Inpatient Rehabilitation Care Coordinator will coordinate services and will keep you informed of any changes. Your Inpatient Rehabilitation Care Coordinator's name and contact numbers are listed  below.  The following services may also be recommended but are not provided by the Inpatient Rehabilitation Center:   Home Health Rehabiltiation Services Outpatient Rehabilitation Services    Arrangements will be made to provide these services after discharge if needed.  Arrangements include referral to agencies that provide these services.  Your insurance has been verified to be:  uninsured Your primary doctor is:  NO PCP  Pertinent information will be shared  with your doctor and your insurance company.  Inpatient Rehabilitation Care Coordinator:  Lavera Guise, [place] [phone] or [phone]  Information discussed with and copy given to patient by: Andria Rhein, 05/03/2023, 10:26 AM

## 2023-05-03 NOTE — Progress Notes (Signed)
Inpatient Rehabilitation  Patient information reviewed and entered into eRehab system by Oyuki Hogan M. Eri Mcevers, M.A., CCC/SLP, PPS Coordinator.  Information including medical coding, functional ability and quality indicators will be reviewed and updated through discharge.    

## 2023-05-03 NOTE — Progress Notes (Signed)
Inpatient Rehabilitation Care Coordinator Assessment and Plan Patient Details  Name: Lance Santiago MRN: [id number] Date of Birth: 1960/11/12  Today's Date: 05/03/2023  Hospital Problems: Principal Problem:   Acute ischemic left ACA stroke [facility])  Past Medical History:  Past Medical History:  Diagnosis Date   Coronary artery disease    Hyperlipidemia    Hypertension    Lumbar disc disease    Myocardial infarction (HCC)    9/18 PCI/DESx1 to RCA, EF 45%   Smoking    Systolic heart failure (HCC)    Past Surgical History:  Past Surgical History:  Procedure Laterality Date   CARDIAC CATHETERIZATION     CORONARY/GRAFT ACUTE MI REVASCULARIZATION N/A 04/25/2017   Procedure: Coronary/Graft Acute MI Revascularization;  Surgeon: Runell Gess, MD;  Location: [facility] INVASIVE CV LAB;  Service: Cardiovascular;  Laterality: N/A;   KNEE SURGERY     LEFT HEART CATH AND CORONARY ANGIOGRAPHY N/A 04/25/2017   Procedure: LEFT HEART CATH AND CORONARY ANGIOGRAPHY;  Surgeon: Runell Gess, MD;  Location: [facility] INVASIVE CV LAB;  Service: Cardiovascular;  Laterality: N/A;   LUMBAR LAMINECTOMY     Social History:  reports that he has been smoking cigarettes. He has a 40 pack-year smoking history. He has never used smokeless tobacco. No history on file for alcohol use and drug use.  Family / Support Systems Marital Status: Divorced How Long?: N/A Patient Roles: Parent Spouse/Significant Other: S.O, Toniann Fail Children: Son, Carter and DIL Other Supports: DIL Anticipated Caregiver: EMERIC, [place], Son and DIL Ability/Limitations of Caregiver: Supervision (Girlfriend works 3rd shift and son works 1st shift. Patient typically transport from family.) Caregiver Availability: 24/7 Family Dynamics: Support S.O and son  Social History Preferred language: English Religion: [employer] Cultural Background: Patient independent overall prior. Education: HS Health Literacy - How often do you need to have  someone help you when you read instructions, pamphlets, or other written material from your doctor or pharmacy?: Rarely Writes: Yes Employment Status: Unemployed Date Retired/Disabled/Unemployed: n/a Marine scientist Issues: n/a Guardian/Conservator: n/a   Abuse/Neglect Abuse/Neglect Assessment Can Be Completed: Yes Physical Abuse: Denies Verbal Abuse: Denies Sexual Abuse: Denies Exploitation of patient/patient's resources: Denies Self-Neglect: Denies  Patient response to: Social Isolation - How often do you feel lonely or isolated from those around you?: Never  Emotional Status Pt's affect, behavior and adjustment status: Plesant Recent Psychosocial Issues: Coping Psychiatric History: N/A Substance Abuse History: N/a  Patient / Family Perceptions, Expectations & Goals Pt/Family understanding of illness & functional limitations: yes Premorbid pt/family roles/activities: Independent, driving and not working Anticipated changes in roles/activities/participation: Patient anticpating roatating supervision from girlfriend, son and DIL. Pt/family expectations/goals: Supervision  [employer]: None Premorbid Home Care/DME Agencies: None Transportation available at discharge: Patient primary transportation for family. Is the patient able to respond to transportation needs?: Yes In the past 12 months, has lack of transportation kept you from medical appointments or from getting medications?: No In the past 12 months, has lack of transportation kept you from meetings, work, or from getting things needed for daily living?: No Resource referrals recommended: Neuropsychology  Discharge Planning Living Arrangements: Spouse/significant other, Children Support Systems: Spouse/significant other, Children Type of Residence: Private residence (1 level home, 2 steps) Insurance Resources: Customer service manager Resources: Family Support Financial Screen  Referred: Yes Living Expenses: Psychologist, sport and exercise Management: Patient, Significant Other Does the patient have any problems obtaining your medications?: Yes (Describe) (Patient uninsured) Home Management: Independent Prior Patient/Family Preliminary Plans: S.O, son and DIL to  assist with tasks if needed. Patient plans to continue to manage Care Coordinator Barriers to Discharge: Insurance for SNF coverage, Decreased caregiver support, Lack of/limited family support Care Coordinator Anticipated Follow Up Needs: HH/OP Expected length of stay: 10-12 Days  Clinical Impression SW met with patient, introduced self and explained role. Sw made attempt to call Toniann Fail at bedside, phone disconnected. Sw will attempt to call at bedside with patient on Wednesday. Patient plans to discharge home with is S.O,, Toniann Fail, son, Ramaj and son's girlfriend to provide 24/7 supervision. S.O works 3rd shift and son works 1st shift. 1 level home, 2 steps to enter. Patient has no current DME. Patient is uninsured! No additional questions or concerns.   Andria Rhein 05/03/2023, 1:27 PM

## 2023-05-03 NOTE — Progress Notes (Signed)
Orthopedic Tech Progress Note Patient Details:  Lance Santiago 06/27/1961 [id number] AFO has been ordered from [facility]  Patient ID: [name], male   DOB: October 27, 1960, 62 y.o.   MRN: [id number]  [name] 05/03/2023, 10:11 AM

## 2023-05-03 NOTE — IPOC Note (Signed)
Overall Plan of Care [facility]) Patient Details Name: Lance Santiago MRN: [id number] DOB: 20-May-1961  Admitting Diagnosis: Acute ischemic left ACA stroke [facility])  Hospital Problems: Principal Problem:   Acute ischemic left ACA stroke (HCC)     Functional Problem List: Nursing Safety, Endurance, Medication Management, Pain  PT Motor, Balance, Safety, Pain, Endurance  OT Balance, Behavior, Safety, Cognition, Skin Integrity, Endurance, Motor, Pain  SLP Cognition  TR         Basic ADL's: OT Grooming, Bathing, Dressing, Toileting     Advanced  ADL's: OT Simple Meal Preparation     Transfers: PT Bed to Chair, Bed Mobility, Car  OT Tub/Shower, Toilet     Locomotion: PT Ambulation, Stairs     Additional Impairments: OT Fuctional Use of Upper Extremity  SLP Social Cognition   Memory, Attention, Awareness  TR      Anticipated Outcomes Item Anticipated Outcome  Self Feeding Independent  Swallowing      Basic self-care  Mod I-superivsion  Toileting  Mod I-supervision   Bathroom Transfers Mod I-supervision  Bowel/Bladder  n/a  Transfers  mod-I  Locomotion  mod-I  Communication     Cognition  spv  Pain  < 4 with prns  Safety/Judgment  manage w cues   Therapy Plan: PT Intensity: Minimum of 1-2 x/day ,45 to 90 minutes PT Frequency: 5 out of 7 days PT Duration Estimated Length of Stay: 10-14 OT Intensity: Minimum of 1-2 x/day, 45 to 90 minutes OT Frequency: 5 out of 7 days OT Duration/Estimated Length of Stay: 10-12 ST Intensity: Minumum of 1-2 x/day, 30 to 90 minutes  SLP Frequency: 3 to 5 out of 7 days SLP Duration/Estimated Length of Stay: 10-12 days   Team Interventions: Nursing Interventions Patient/Family Education, Pain Management, Medication Management, Discharge Planning, Disease Management/Prevention  PT interventions Ambulation/gait training, Balance/vestibular training, Cognitive remediation/compensation, Community reintegration, Discharge  planning, Disease management/prevention, DME/adaptive equipment instruction, Functional electrical stimulation, Neuromuscular re-education, Functional mobility training, Pain management, Patient/family education, Psychosocial support, Splinting/orthotics, Stair training, Therapeutic Activities, Therapeutic Exercise, UE/LE Strength taining/ROM, UE/LE Coordination activities, Visual/perceptual remediation/compensation  OT Interventions Balance/vestibular training, Discharge planning, Pain management, Self Care/advanced ADL retraining, Therapeutic Activities, UE/LE Coordination activities, Cognitive remediation/compensation, Disease mangement/prevention, Functional mobility training, Patient/family education, Skin care/wound managment, Functional electrical stimulation, Therapeutic Exercise, Community reintegration, DME/adaptive equipment instruction, Neuromuscular re-education, Psychosocial support, Splinting/orthotics, UE/LE Strength taining/ROM  SLP Interventions Cognitive remediation/compensation, Internal/external aids, Cueing hierarchy, Therapeutic Activities, Environmental controls, Functional tasks, Patient/family education  TR Interventions    SW/CM Interventions Discharge Planning, Psychosocial Support, Patient/Family Education, Disease Management/Prevention   Barriers to Discharge MD  Medical stability  Nursing Home environment access/layout, Medication compliance 1 level 2 ste no rails;girlfriend works 3rd shift, son works 1st shift and 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  PT Lack of/limited family support    OT      SLP      SW Insurance for SNF coverage, Decreased caregiver support, Lack of/limited family support     Team Discharge Planning: Destination: PT-Home ,OT- Home , SLP-Home Projected Follow-up: PT-Outpatient PT, OT-  Outpatient OT, None (To be determined), SLP-Outpatient SLP Projected Equipment Needs: PT-To be determined, OT- To be determined, SLP-  None Equipment Details: PT- , OT-  Patient/family involved in discharge planning: PT- Patient,  OT-Patient, SLP-Patient  MD ELOS: 10-12 Medical Rehab Prognosis:  Excellent Assessment: The patient has been admitted for CIR therapies with the diagnosis of left ACA infarct . The team will be addressing functional mobility,  strength, stamina, balance, safety, adaptive techniques and equipment, self-care, bowel and bladder mgt, patient and caregiver education. Goals have been set at  supervision pt/ot/slp. Anticipated discharge destination is home.        See Team Conference Notes for weekly updates to the plan of care

## 2023-05-04 DIAGNOSIS — I63522 Cerebral infarction due to unspecified occlusion or stenosis of left anterior cerebral artery: Secondary | ICD-10-CM | POA: Diagnosis not present

## 2023-05-04 MED ORDER — L-METHYLFOLATE-B6-B12 3-35-2 MG PO TABS
1.0000 | ORAL_TABLET | Freq: Every day | ORAL | Status: DC
  Administered 2023-05-04 – 2023-05-07 (×4): 1 via ORAL
  Filled 2023-05-04 (×4): qty 1

## 2023-05-04 MED ORDER — TRAMADOL HCL 50 MG PO TABS
50.0000 mg | ORAL_TABLET | Freq: Three times a day (TID) | ORAL | Status: DC | PRN
  Administered 2023-05-05 – 2023-05-06 (×3): 50 mg via ORAL
  Filled 2023-05-04 (×3): qty 1

## 2023-05-04 MED ORDER — MAGNESIUM GLUCONATE 500 MG PO TABS
500.0000 mg | ORAL_TABLET | Freq: Every day | ORAL | Status: DC
  Administered 2023-05-04 – 2023-05-06 (×3): 500 mg via ORAL
  Filled 2023-05-04 (×3): qty 1

## 2023-05-04 NOTE — Progress Notes (Signed)
PROGRESS NOTE   Subjective/Complaints: Order placed for IV removal Appreciate staff calling down for medium sized PRAFO He has no new complaints this morning  ROS: as per HPI. Denies CP, SOB, abd pain, N/V/D/C, or any other complaints at this time.    Objective:   No results found. Recent Labs    05/03/23 0641  WBC 8.1  HGB 17.1*  HCT 51.8  PLT 239   Recent Labs    05/03/23 0641  NA 131*  K 4.4  CL 99  CO2 22  GLUCOSE 88  BUN 29*  CREATININE 1.33*  CALCIUM 9.2     Intake/Output Summary (Last 24 hours) at 05/04/2023 1208 Last data filed at 05/04/2023 0748 Gross per 24 hour  Intake 960 ml  Output 300 ml  Net 660 ml        Physical Exam: Vital Signs Blood pressure (!) 135/95, pulse 81, temperature 98 F (36.7 C), resp. rate 18, height 5\' 7"  (1.702 m), weight 65.1 kg, SpO2 98%.  Physical Exam Vitals and nursing note reviewed. BMI 22.48 Constitutional:      Appearance: Normal appearance. Sitting in bedside chair HENT:     Head: Normocephalic.     Right Ear: External ear normal.     Left Ear: External ear normal.     Nose: Nose normal.     Mouth/Throat:     Comments: Missing numerous teeth Eyes:     Pupils: Pupils are equal, round, and reactive to light.  Cardiovascular:     Rate and Rhythm: Normal rate and regular rhythm.     Pulses: Normal pulses.     Heart sounds: No murmur heard.    No gallop.  Pulmonary:     Effort: Pulmonary effort is normal. No respiratory distress.     Breath sounds: Normal breath sounds.  Abdominal:     General: Bowel sounds are normal. There is no distension.     Palpations: Abdomen is soft.     Tenderness: There is no abdominal tenderness.  Extremities: no edema MsK: holding phone without difficulty, able to lift both legs but RLE weaker Musculoskeletal:        General: No swelling or deformity. Normal range of motion.     Cervical back: Normal range of motion.   Skin:    General: Skin is warm and dry.  Neurological:     Mental Status: He is alert and oriented to person, place, and time.     Comments: Alert and oriented x 3. Normal insight and awareness. Intact Memory. Normal language and speech sl dysarthric. Cranial nerve exam unremarkable except for mild right central 7.. MMT: RUE 4 to 4+/5 prox to distal. LUE 5/5. RLE 2/5 HF, 1+ to 2- KE and 0/5 ADF/PF. LLE 5/5. Sensory exam normal for light touch and pain in all 4 limbs. No limb ataxia or cerebellar signs. No abnormal tone appreciated.   Ambulating MinA     Psychiatric:        Mood and Affect: Mood normal.        Behavior: Behavior normal.  Assessment/Plan: 1. Functional deficits which require 3+ hours per day of interdisciplinary therapy in a comprehensive inpatient  rehab setting. Physiatrist is providing close team supervision and 24 hour management of active medical problems listed below. Physiatrist and rehab team continue to assess barriers to discharge/monitor patient progress toward functional and medical goals  Care Tool:  Bathing    Body parts bathed by patient: Right arm, Left arm, Chest, Abdomen, Front perineal area, Right upper leg, Left upper leg, Face, Right lower leg, Left lower leg   Body parts bathed by helper: Buttocks     Bathing assist Assist Level: Minimal Assistance - Patient > 75%     Upper Body Dressing/Undressing Upper body dressing   What is the patient wearing?: Pull over shirt    Upper body assist Assist Level: Supervision/Verbal cueing    Lower Body Dressing/Undressing Lower body dressing      What is the patient wearing?: Pants     Lower body assist Assist for lower body dressing: Supervision/Verbal cueing     Toileting Toileting    Toileting assist Assist for toileting: Supervision/Verbal cueing     Transfers Chair/bed transfer  Transfers assist     Chair/bed transfer assist level: Contact Guard/Touching assist      Locomotion Ambulation   Ambulation assist      Assist level: Contact Guard/Touching assist Assistive device: No Device Max distance: 139ft   Walk 10 feet activity   Assist     Assist level: Contact Guard/Touching assist Assistive device: No Device   Walk 50 feet activity   Assist    Assist level: Contact Guard/Touching assist Assistive device: No Device    Walk 150 feet activity   Assist    Assist level: Minimal Assistance - Patient > 75% Assistive device: No Device    Walk 10 feet on uneven surface  activity   Assist     Assist level: Minimal Assistance - Patient > 75%     Wheelchair     Assist Is the patient using a wheelchair?: Yes Type of Wheelchair: Manual    Wheelchair assist level: Minimal Assistance - Patient > 75% Max wheelchair distance: 150 ft    Wheelchair 50 feet with 2 turns activity    Assist        Assist Level: Minimal Assistance - Patient > 75%   Wheelchair 150 feet activity     Assist      Assist Level: Minimal Assistance - Patient > 75%   Blood pressure (!) 135/95, pulse 81, temperature 98 F (36.7 C), resp. rate 18, height 5\' 7"  (1.702 m), weight 65.1 kg, SpO2 98%.  Medical Problem List and Plan: 1. 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 Will likely need AFO for gait  -Continue CIR  D/c IV  Metanx started  2.  Impaired mobility: d/c lovenox since ambulating 300 feet             -antiplatelet therapy: ASA/Plavix DAPT X 3 months followed by ASA alone.   3. Low back pain: Tylenol prn mild and ultram prn moderate pain, kpad ordered             --continue Lidoderm patches for local measure. Decrease tramadol to q8H prn  4. Mood/Behavior/Sleep: LCSW to follow for evaluation and support.              -  antipsychotic agents: N/A  -Trazodone PRN 5.  Neuropsych/cognition: This patient is capable of making decisions on his own behalf. 6. Skin/Wound Care: Routine pressure relief measures.  7. Fluids/Electrolytes/Nutrition: Monitor I/O. Monitor labs  8. HTN: Monitor BP TID, continue Amlodipine 10mg  daily, Cozaar 100mg  daily, Metoprolol 25mg  BID             -BP poorly controlled at present             -add hydralazine 10mg  q6h PRN for BP > 199/99  Increase magnesium gluconate to 500mg  HS Vitals:   05/01/23 2315 05/02/23 0046 05/02/23 0459 05/02/23 0525  BP: (!) 147/101 (!) 148/107 (!) 173/115 (!) 161/106   05/02/23 0636 05/02/23 1247 05/02/23 2029 05/03/23 0451  BP: (!) 166/104 (!) 132/96 (!) 150/93 (!) 149/85   05/03/23 0700 05/03/23 1325 05/03/23 1929 05/04/23 0501  BP: (!) 147/112 130/86 (!) 134/92 (!) 135/95    9. CAD: Monitor for symptoms with increase in activity.  --daily wts. Monitor for signs of overload.              -- On Cozaar, metoprolol, Crestor 40mg  QD  -05/02/23 wt a little down, monitor Filed Weights   04/30/23 1600 05/02/23 0500 05/03/23 0634  Weight: 68.4 kg 65.8 kg 65.1 kg    10 Pre-renal azotemia: Encourage fluid intake, Cr 1.42 on 05/01/23.  11. Tobacco dependence: Continue nicotine patch 21mg . Changed to daily rather than PRN, per request             --educate on importance of tobacco cessation 12. Aortoiliac disease w/ occluded Left CIA/infrainguinal dz:  NO rest pain. Continue ASA and Lipitor.              --smoking cessation.  --follow up with Dr. Randie Heinz after discharge  13. Suboptimal Vitamin D: 05/01/23 level 26.64, will continue supplementation 50000U weekly x7wks  LOS: 4 days A FACE TO FACE EVALUATION WAS PERFORMED  Clint Bolder P Melchizedek Espinola 05/04/2023, 12:08 PM

## 2023-05-04 NOTE — Progress Notes (Signed)
Speech Language Pathology Daily Session Note  Patient Details  Name: Lance Santiago MRN: [id number] Date of Birth: 01/16/1961  Today's Date: 05/04/2023 SLP Individual Time: 0800-0900 SLP Individual Time Calculation (min): 60 min  Short Term Goals: Week 1: SLP Short Term Goal 1 (Week 1): Pt will recall 2 areas of cognitive impairment with minA verbal cues. SLP Short Term Goal 2 (Week 1): Pt will recall 2 internal and 2 external memory strategies for recall of important information with minA verbal cues. SLP Short Term Goal 3 (Week 1): Pt will recall 2+ strategies to compensate for attention impairment with minA verbal cues.  Skilled Therapeutic Interventions:  Pt was seen in am to address cognitive re- training. Pt was alert and seated upright in recliner upon SLP arrival. He was agreeable for session. SLP introduced WRAP memory strategies. SLP provided functional examples of utilization with pt adding insight as appropriate. SLP challenged pt in recall of moderate level paragraph presented verbally. SLP guided pt in utilization of repetition and association strategies. Given a 20 minute delay, pt recalled information with 100% acc with mod I. SLP also addressed problem solving via presenting daily living and medication scenarios verbally. Pt identified solutions to problems with 90% acc with sup A. At conclusion of session, pt requested to go to the bathroom. She ambulated to the bathroom where he was continent of bladder with sup A for safety. Pt was left seated upright in recliner with chair alarm active and call button within reach. SLP to continue POC.   Pain Pain Assessment Pain Scale: 0-10 Pain Score: 0-No pain  Therapy/Group: Individual Therapy  Renaee Munda 05/04/2023, 8:54 AM

## 2023-05-04 NOTE — Progress Notes (Signed)
Physical Therapy Session Note  Patient Details  Name: Lance Santiago MRN: [id number] Date of Birth: 31-Jan-1961  Today's Date: 05/04/2023 PT Individual Time: 4098-1191 PT Individual Time Calculation (min): 75 min   Short Term Goals: Week 1:  PT Short Term Goal 1 (Week 1): Pt will be able to STS w/ CGA using LRAD PT Short Term Goal 2 (Week 1): Pt will be able to perform bed to chair w/ CGA using LRAD PT Short Term Goal 3 (Week 1): Pt will ambulate 300 ft CGA w/ LRAD PT Short Term Goal 4 (Week 1): Pt perform 12 stairs CGA w/ LRAD  Skilled Therapeutic Interventions/Progress Updates:  Patient seated upright in recliner on entrance to room. Patient alert and agreeable to PT session.   Patient with no pain complaint at start of session. But does relate recent and past history with current RLE posterior chain weakness.   Therapeutic Activity: Transfers: Pt performed sit<>stand and stand pivot transfers throughout session with supervision/ CGA for balance. No vc required for technique. Ambulatory transfer to toilet for continent void with distant supervision.   Gait Training:  Pt ambulated 185' x2 using RW with supervision/ int CGA. Demonstrated slight steppage gait with RLE for foot clearance and flat foot strike. Provided vc/ tc for decreasing slide of foot forward on floor.  Neuromuscular Re-ed: NMR facilitated during session with focus on standing balance. Attempts for Df/ PF, hamstring, glute activation for strength assessment and NM activation. Also determined pt is able to maintain ankle to neutral position but has limited ROM past neutral d/t achilles tendon and gastroc/ soleus tightening. Limited hip strength with reduced strength moving distally d/t spinal limitations.   Pt guided in squats with glute squeeze x15, forward lunges with leading RLE and spring back to initial stance x15. Squats with focus on midline orientation or bias to R side. With increased focus to quad/ glute  activation and therapist facilitation in L lateral weight shift, pt is able to progress to bring RLE back to initial position. Otherwise pt needing 2 attempts.   Lateral stepping with focus on hip strengthening and motor control in foot placement and control in advancement in either direction. Attempt for backward ambulation but pt struggles d/t reduced hamstring/ glute activation.  NMR performed for improvements in motor control and coordination, balance, sequencing, judgement, and self confidence/ efficacy in performing all aspects of mobility at highest level of independence.   Patient seated upright in recliner at end of session with brakes locked, belt alarm set, and all needs within reach.  Therapy Documentation Precautions:  Precautions Precautions: Fall, Other (comment) Precaution Comments: BP <180/105 Restrictions Weight Bearing Restrictions: No  Pain: Pain Assessment Pain Scale: 0-10 Pain Score: 0-No pain this session.  Therapy/Group: Individual Therapy  Loel Dubonnet PT, DPT, CSRS 05/04/2023, 12:26 PM

## 2023-05-04 NOTE — Progress Notes (Signed)
Occupational Therapy Session Note  Patient Details  Name: QUADRE DABDOUB MRN: [id number] Date of Birth: Aug 21, 1960  Today's Date: 05/04/2023 OT Individual Time: 0930-1000 OT Individual Time Calculation (min): 30 min    Short Term Goals: Week 1:  OT Short Term Goal 1 (Week 1): Pt will complete toilet/shower transfers with CGA using LRAD OT Short Term Goal 2 (Week 1): Pt will complete LB dressing with CGA using modfied techniques PRN and LRAD OT Short Term Goal 3 (Week 1): Pt wil recall hemi-dressing technique with min questioning cues OT Short Term Goal 4 (Week 1): Pt complete toileting wiht CGA  Skilled Therapeutic Interventions/Progress Updates:    OT intervention with focus on amb with RW and standing balance. Amb with RW to day room with supervision. Standing task on AirEx to place and remove pinch pins on basketball net. Pt stood on AirEx with no UE support to clean all equipment. Pt returned to room and sat in recliner. Belt alarm activated. All needs within reach.  Therapy Documentation Precautions:  Precautions Precautions: Fall, Other (comment) Precaution Comments: BP <180/105 Restrictions Weight Bearing Restrictions: No   Pain: Pt reports a "little" back pain; but states he is "ok"   Therapy/Group: Individual Therapy  Rich Brave 05/04/2023, 11:07 AM

## 2023-05-04 NOTE — Progress Notes (Signed)
Physical Therapy Session Note  Patient Details  Name: Lance Santiago MRN: [id number] Date of Birth: Nov 16, 1960  Today's Date: 05/04/2023 PT Individual Time: 1030-1138 PT Individual Time Calculation (min): 68 min   Short Term Goals: Week 1:  PT Short Term Goal 1 (Week 1): Pt will be able to STS w/ CGA using LRAD PT Short Term Goal 2 (Week 1): Pt will be able to perform bed to chair w/ CGA using LRAD PT Short Term Goal 3 (Week 1): Pt will ambulate 300 ft CGA w/ LRAD PT Short Term Goal 4 (Week 1): Pt perform 12 stairs CGA w/ LRAD  Skilled Therapeutic Interventions/Progress Updates:    Pt seated in recliner upon arrival. Pt agreeable to therapy. Pt denies any pain.   Vitals assessed throughout session to ensure BP remains <180/105. Seated in recliner BP 160/100, standing 140/100, gait from room to day room 158/105, ambulation on lite gait 143/100 bout 1, ambulation on lite gait bout 2 151/100   Backwards ambulation on litegait for a total of 14 minutes, at 0.8 mph, for total of 711 feet, with therapist providing min A to advance L LE into hip extension, verbal cues provided for knee flexion versus trunk flexion compensation. Pt demos improved knee flexion in comparison to backwards walking with RW during previus session with this PT.   Assessed carryover of backwards walking with RW, verbal cues provided for knee flexion for LE clearance, pt still dependent on trunk flexion compensation, pt reports inability to perform knee flexion while standing. Therapist assisted ability to perform knee flexion while seated EOM. Pt unable to bend knee past 90 while seated, but no ROM restriction passively. Pt performed 1x5 active assisted knee flexion with R LE on scooter and therapist pushing scooter, progressed to 1x5 with pt moving scooter on his own, progressed to 1x10 active knee flexion while seated.   Gait with no AD and min A, verbal cues provided for R LE heel strike. Pt performed ambulatory  transfer to recliner, pt demos improved knee flexion with backwards step to recliner with RW with CGA.   Pt seated in recliner with all needs within reach and seatbelt alarm on.    Therapy Documentation Precautions:  Precautions Precautions: Fall, Other (comment) Precaution Comments: BP <180/105 Restrictions Weight Bearing Restrictions: No  Therapy/Group: Individual Therapy  [facility] Ambrose [place], [place], DPT  05/04/2023, 7:57 AM

## 2023-05-05 DIAGNOSIS — I63522 Cerebral infarction due to unspecified occlusion or stenosis of left anterior cerebral artery: Secondary | ICD-10-CM | POA: Diagnosis not present

## 2023-05-05 MED ORDER — ROSUVASTATIN CALCIUM 40 MG PO TABS
40.0000 mg | ORAL_TABLET | Freq: Every day | ORAL | 0 refills | Status: DC
  Filled 2023-05-05: qty 30, 30d supply, fill #0

## 2023-05-05 MED ORDER — VITAMIN D (ERGOCALCIFEROL) 1.25 MG (50000 UNIT) PO CAPS
50000.0000 [IU] | ORAL_CAPSULE | ORAL | 0 refills | Status: DC
  Filled 2023-05-05: qty 5, 35d supply, fill #0

## 2023-05-05 MED ORDER — LOSARTAN POTASSIUM 100 MG PO TABS
100.0000 mg | ORAL_TABLET | Freq: Every day | ORAL | 0 refills | Status: DC
  Filled 2023-05-05: qty 30, 30d supply, fill #0

## 2023-05-05 MED ORDER — PANTOPRAZOLE SODIUM 40 MG PO TBEC
40.0000 mg | DELAYED_RELEASE_TABLET | Freq: Every day | ORAL | 0 refills | Status: DC
  Filled 2023-05-05: qty 30, 30d supply, fill #0

## 2023-05-05 MED ORDER — L-METHYLFOLATE-B6-B12 3-35-2 MG PO TABS
1.0000 | ORAL_TABLET | Freq: Every day | ORAL | 0 refills | Status: DC
  Filled 2023-05-05: qty 60, 60d supply, fill #0

## 2023-05-05 MED ORDER — CLOPIDOGREL BISULFATE 75 MG PO TABS
75.0000 mg | ORAL_TABLET | Freq: Every day | ORAL | 0 refills | Status: DC
  Filled 2023-05-05: qty 30, 30d supply, fill #0

## 2023-05-05 MED ORDER — METOPROLOL TARTRATE 50 MG PO TABS
50.0000 mg | ORAL_TABLET | Freq: Two times a day (BID) | ORAL | 0 refills | Status: DC
  Filled 2023-05-05: qty 60, 30d supply, fill #0

## 2023-05-05 MED ORDER — LIDOCAINE 5 % EX PTCH
1.0000 | MEDICATED_PATCH | Freq: Every day | CUTANEOUS | 0 refills | Status: DC
  Filled 2023-05-05: qty 30, 30d supply, fill #0

## 2023-05-05 MED ORDER — METOPROLOL TARTRATE 50 MG PO TABS
50.0000 mg | ORAL_TABLET | Freq: Two times a day (BID) | ORAL | Status: DC
  Administered 2023-05-05 – 2023-05-07 (×4): 50 mg via ORAL
  Filled 2023-05-05 (×4): qty 1

## 2023-05-05 MED ORDER — MAGNESIUM OXIDE 400 MG PO TABS
400.0000 mg | ORAL_TABLET | Freq: Every day | ORAL | 0 refills | Status: AC
  Filled 2023-05-05: qty 120, 120d supply, fill #0

## 2023-05-05 MED ORDER — AMLODIPINE BESYLATE 10 MG PO TABS
10.0000 mg | ORAL_TABLET | Freq: Every day | ORAL | 0 refills | Status: DC
  Filled 2023-05-05: qty 30, 30d supply, fill #0

## 2023-05-05 MED ORDER — ASPIRIN 81 MG PO TBEC
81.0000 mg | DELAYED_RELEASE_TABLET | Freq: Every day | ORAL | 0 refills | Status: AC
  Filled 2023-05-05: qty 120, 120d supply, fill #0

## 2023-05-05 MED ORDER — ACETAMINOPHEN 325 MG PO TABS: 325.0000 mg | ORAL_TABLET | ORAL | Status: AC | PRN

## 2023-05-05 NOTE — Plan of Care (Signed)
  Problem: Consults Goal: RH STROKE PATIENT EDUCATION Description: See Patient Education module for education specifics  Outcome: Progressing   Problem: RH SAFETY Goal: RH STG ADHERE TO SAFETY PRECAUTIONS W/ASSISTANCE/DEVICE Description: STG Adhere to Safety Precautions With cues Assistance/Device. Outcome: Progressing   Problem: RH PAIN MANAGEMENT Goal: RH STG PAIN MANAGED AT OR BELOW PT'S PAIN GOAL Description: < 4 with prns Outcome: Progressing   Problem: RH KNOWLEDGE DEFICIT Goal: RH STG INCREASE KNOWLEDGE OF DIABETES Description: Patient and significant other will be able to manage DM with medications and dietary modifications using educational resources independently Outcome: Progressing Goal: RH STG INCREASE KNOWLEDGE OF HYPERTENSION Description: Patient and significant other will be able to manage HTN with medications and dietary modifications using educational resources independently Outcome: Progressing Goal: RH STG INCREASE KNOWLEGDE OF HYPERLIPIDEMIA Description: Patient and significant other will be able to manage HLD with medications and dietary modifications using educational resources independently Outcome: Progressing Goal: RH STG INCREASE KNOWLEDGE OF STROKE PROPHYLAXIS Description: Patient and significant other will be able to manage secondary stroke risks with medications and dietary modifications using educational resources independently Outcome: Progressing   Problem: RH KNOWLEDGE DEFICIT Goal: RH STG INCREASE KNOWLEDGE OF STROKE PROPHYLAXIS Description: Patient and significant other will be able to manage secondary stroke risks with medications and dietary modifications using educational resources independently Outcome: Progressing

## 2023-05-05 NOTE — Patient Care Conference (Signed)
Inpatient RehabilitationTeam Conference and Plan of Care Update Date: 05/05/2023   Time: 11:38 AM    Patient Name: Lance Santiago      Medical Record Number: [id number]  Date of Birth: 1961-05-05 Sex: Male         Room/Bed: 4W21C/4W21C-01 Payor Info: Payor: /    Admit Date/Time:  04/30/2023  4:05 PM  Primary Diagnosis:  Acute ischemic left ACA stroke [facility])  Hospital Problems: Principal Problem:   Acute ischemic left ACA stroke [facility])    Expected Discharge Date: Expected Discharge Date: 05/07/23  Team Members Present: Physician leading conference: Dr. Sula Soda Social Worker Present: Lavera Guise, BSW Nurse Present: Chana Bode, RN PT Present: Raechel Chute, PT OT Present: Candee Furbish, OT PPS Coordinator present : Edson Snowball, PT     Current Status/Progress Goal Weekly Team Focus  Bowel/Bladder   cont of B&B LBM 9/23   remain cont   assist with toileting prn    Swallow/Nutrition/ Hydration               ADL's   Set up A UB, Supervision/CGA LB and toileting   Supervision; will need to upgrade pending DC date selected   barriers- hypertension (needs education about self monitoring especially being aymptomatic), R glute/hamstring weakness, global endurance, family education    Mobility   bed mobility CGA, transfers without AD min A, gait 221ft without AD min A, 12 6in steps with 2 handrails and min A   Mod I  barriers: R sided weakness, decreased balance/coordination, family education    Communication                Safety/Cognition/ Behavioral Observations  supervision with memory and attention task at this time   Supervision   pt/ caregiver education, functional task, awareness    Pain   generalized pain reported, controlled with current meds   pain remain in control   assess qshift and prn    Skin   skin intact   remain intact  assess qshift and prn      Discharge Planning:  Discharging home with S.O, son and son's GF to  provide 24/7 supervision. S.O works 3rd shift and son works 1st shift). 2 steps to enter home. Patient uninsured.   Team Discussion: Patient with elevated BP; MD adjusting meds. Back pain addressed. Doing well overall.  Patient on target to meet rehab goals: yes, currently needs supervision for ADLs and cognition. Needs CGA for lower body dressing and transfers without an assistive device. With an assistive device; able to ambulation up to 200' with supervision.  Able to manage 6 steps with min assist.  *See Care Plan and progress notes for long and short-term goals.   Revisions to Treatment Plan:  N/a  Teaching Needs: Safety, medications, dietary modification, transfers, toileting, etc.  Current Barriers to Discharge: Decreased caregiver support, Home enviroment access/layout, and lack of insurance for follow up services/DME  Possible Resolutions to Barriers: Family education WUJ:WJXB up brace, TTB, SPC      Medical Summary Current Status: hypertension, low back pain, foot drop  Barriers to Discharge: Medical stability  Barriers to Discharge Comments: hypertension, low back pain, foot drop Possible Resolutions to [employer]: continue to monitor blood pressure TID, increaes lopressor to 50mg  BID, ordered foot-up brace, trialed Nexwave, continue lidoderm patch   Continued Need for Acute Rehabilitation Level of Care: The patient requires daily medical management by a physician with specialized training in physical medicine and rehabilitation for the following  reasons: Direction of a multidisciplinary physical rehabilitation program to maximize functional independence : Yes Medical management of patient stability for increased activity during participation in an intensive rehabilitation regime.: Yes Analysis of laboratory values and/or radiology reports with any subsequent need for medication adjustment and/or medical intervention. : Yes   I attest that I was present, lead  the team conference, and concur with the assessment and plan of the team.   Chana Bode B 05/05/2023, 2:54 PM

## 2023-05-05 NOTE — Discharge Instructions (Addendum)
Inpatient Rehab Discharge Instructions  Lance Santiago Discharge date and time:  05/07/23  Activities/Precautions/ Functional Status: Activity: no lifting, driving, or strenuous exercise till cleared by MD Diet: cardiac diet Need to increase fluid intake-- like water, juice, milk, Gatorade Wound Care: none needed   Functional status:  ___ No restrictions     ___ Walk up steps independently ___ 24/7 supervision/assistance   ___ Walk up steps with assistance _X__ Intermittent supervision/assistance  _X__ Bathe/dress independently ___ Walk with walker     ___ Bathe/dress with assistance ___ Walk Independently    ___ Shower independently ___ Walk with assistance    ___ Shower with assistance __X_ No alcohol     ___ Return to work/school ________   COMMUNITY REFERRALS UPON DISCHARGE:    Medical Equipment/Items Ordered: [employer] copy[employer]                                                 Agency/Supplier: [employer] [phone]   Special Instructions: Need to increase fluid intake as your kidneys are getting dry.  Drink a variety of different fluids.    My questions have been answered and I understand these instructions. I will adhere to these goals and the provided educational materials after my discharge from the hospital.  Patient/Caregiver Signature _______________________________ Date __________  Clinician Signature _______________________________________ Date __________  Please bring this form and your medication list with you to all your follow-up doctor's appointments.

## 2023-05-05 NOTE — Progress Notes (Signed)
Patient ID: Lance Santiago, male   DOB: Oct 14, 1960, 63 y.o.   MRN: [id number]   Team Conference Report to Patient/Family  Team Conference discussion was reviewed with the patient and caregiver, including goals, any changes in plan of care and target discharge date.  Patient and caregiver express understanding and are in agreement.  The patient has a target discharge date of 05/07/23.   Sw met with patient and provided team conference updates. Patient pleased with discharge date. Patient will have his brother pick him up on Friday between 9-11 AM. Patient will require a SPC and TTB. Patient potential to purchase TTB privately. No additional questions or concerns.  Andria Rhein 05/05/2023, 2:37 PM

## 2023-05-05 NOTE — Plan of Care (Signed)
  Problem: Consults Goal: RH STROKE PATIENT EDUCATION Description: See Patient Education module for education specifics  Outcome: Progressing   Problem: RH SAFETY Goal: RH STG ADHERE TO SAFETY PRECAUTIONS W/ASSISTANCE/DEVICE Description: STG Adhere to Safety Precautions With cues Assistance/Device. Outcome: Progressing   Problem: RH PAIN MANAGEMENT Goal: RH STG PAIN MANAGED AT OR BELOW PT'S PAIN GOAL Description: < 4 with prns Outcome: Progressing   Problem: RH KNOWLEDGE DEFICIT Goal: RH STG INCREASE KNOWLEDGE OF DIABETES Description: Patient and significant other will be able to manage DM with medications and dietary modifications using educational resources independently Outcome: Progressing Goal: RH STG INCREASE KNOWLEDGE OF HYPERTENSION Description: Patient and significant other will be able to manage HTN with medications and dietary modifications using educational resources independently Outcome: Progressing Goal: RH STG INCREASE KNOWLEGDE OF HYPERLIPIDEMIA Description: Patient and significant other will be able to manage HLD with medications and dietary modifications using educational resources independently Outcome: Progressing Goal: RH STG INCREASE KNOWLEDGE OF STROKE PROPHYLAXIS Description: Patient and significant other will be able to manage secondary stroke risks with medications and dietary modifications using educational resources independently Outcome: Progressing

## 2023-05-05 NOTE — Progress Notes (Signed)
Patient ID: Lance Santiago, male   DOB: 09/05/1960, 62 y.o.   MRN: [id number]  [facility] and TTB ordered through [facility].

## 2023-05-05 NOTE — Progress Notes (Signed)
Occupational Therapy Session Note  Patient Details  Name: Lance Santiago MRN: [id number] Date of Birth: 11/25/1960  Today's Date: 05/05/2023 OT Individual Time: 4401-0272 & 1420-1530 OT Individual Time Calculation (min): 70 min  & 70 min   Short Term Goals: Week 1:  OT Short Term Goal 1 (Week 1): Pt will complete toilet/shower transfers with CGA using LRAD OT Short Term Goal 2 (Week 1): Pt will complete LB dressing with CGA using modfied techniques PRN and LRAD OT Short Term Goal 3 (Week 1): Pt wil recall hemi-dressing technique with min questioning cues OT Short Term Goal 4 (Week 1): Pt complete toileting wiht CGA  Skilled Therapeutic Interventions/Progress Updates:  Session 1 Skilled OT intervention completed with focus on ADL retraining, functional endurance, and mobility within a shower context as well as DC planning and DME education. Pt received seated in recliner, agreeable to session. No pain reported.  Pt completed all sit > stands and ambulatory transfers with [facility] and without AD. Min cues needed for fall prevention and when prepping his own items around the room.  Ambulated to shower to TTB. Pt was able to bathe all parts with set up A, including at the sit > stand level while using grab bar for balance. Utilized long handled sponge to access BLE and periareas as applicable. Donned pants on TTB with supervision. Supervision transfer using grab bar > recliner. Able to donn shirt/deo independently. Donned grip socks with set up A via figure 4 position. BP assessed following activity.  Pt's glasses reported to be broken; OT retrieved super glue and fixed so the lens would stay in place.  Pt ambulated > 300 ft to ADL apartment. Seated rest in recliner. Pt reports tub/shower in which he requested a TTB for. Able to ambulate and transfer on/off TTB in tub with supervision. Pt reports to have use of hand held shower head. Education provided on shower curtain management to prevent  water spillage via tucking under hip, minimizing stands via lateral leans for pericare, use of current grab bars for balance as well as effective safety strategies for exiting the shower to eliminate falls. Pt is uninsured therefore discussed how to purchase TTB privately if applicable at DC and understands no follow up OT until medicaid is processed.  Ambulated > 300 ft back to room, then pt remained seated in recliner, with belt alarm on/activated, and with all needs in reach at end of session.  Vitals BP 150/102 (seated after shower), HR 75 bpm; asymptomatic     Session 2 Skilled OT intervention completed with focus on ambulatory endurance, dual tasking, IADL management, global strengthening. Pt received seated in recliner, agreeable to session. No pain reported.  Completed all sit > stands and ambulatory transfers with close supervision with/without SPC during session.  Ambulated > toilet, continent of urinary void in stance with distant supervision. Ambulated to ADL kitchen. In kitchen, pt demonstrated ability to reach into various cabinet heights, gather items including moderately heavy pans/items, open/close fridge door, all at the supervision level without LOB or fatigue without AD. Discussed cutting and microwave safety with modified strategies for fatigue.   To promote dual tasking, increased Rt attention and higher dynamic balance, pt ambulated > 1000 ft while balancing and apple on standard plate around rehab dept. Pt did tip over the apple several times, especially with turns and distractions in environment but no LOB. Did bump into items 3 times on Rt side.  Ambulated > gym. Transitioned into quadruped with supervision on mat  table. Completed the following exercises to promote global/glute strengthening needed for Rt hemi recovery for functional ambulation: -cross body slid on mat with 4 lb dumbbell, then shoulder retraction with 4 lb dumbbell; 10 total alternating sides -modified  hip extension with Rt foot sliding on mat due to weakness x10 -hip abduction x5 -2 pushups in standard position Standing at elevated mat: -hip extension holds x10 with cues to avoid trunk rotation  Ambulated back to room, where pt remained seated in recliner, with belt alarm on/activated, and with all needs in reach at end of session.   Therapy Documentation Precautions:  Precautions Precautions: Fall, Other (comment) Precaution Comments: BP <180/105 Restrictions Weight Bearing Restrictions: No    Therapy/Group: Individual Therapy  Melvyn Novas, MS, OTR/L  05/05/2023, 3:34 PM

## 2023-05-05 NOTE — Progress Notes (Signed)
PROGRESS NOTE   Subjective/Complaints: No new complaints this morning Would like to try tens unit for back pain Does not have shoes here but would benefit from a foot up brace outpatient  ROS: as per HPI. Denies CP, SOB, abd pain, N/V/D/C, or any other complaints at this time.    Objective:   No results found. Recent Labs    05/03/23 0641  WBC 8.1  HGB 17.1*  HCT 51.8  PLT 239   Recent Labs    05/03/23 0641  NA 131*  K 4.4  CL 99  CO2 22  GLUCOSE 88  BUN 29*  CREATININE 1.33*  CALCIUM 9.2     Intake/Output Summary (Last 24 hours) at 05/05/2023 1027 Last data filed at 05/05/2023 0725 Gross per 24 hour  Intake 720 ml  Output --  Net 720 ml        Physical Exam: Vital Signs Blood pressure (!) 156/101, pulse 86, temperature 97.6 F (36.4 C), resp. rate 18, height 5\' 7"  (1.702 m), weight 65.5 kg, SpO2 100%.  Physical Exam Vitals and nursing note reviewed. BMI 22.62 Constitutional:      Appearance: Normal appearance. Sitting in bedside chair HENT:     Head: Normocephalic.     Right Ear: External ear normal.     Left Ear: External ear normal.     Nose: Nose normal.     Mouth/Throat:     Comments: Missing numerous teeth Eyes:     Pupils: Pupils are equal, round, and reactive to light.  Cardiovascular:     Rate and Rhythm: Normal rate and regular rhythm.     Pulses: Normal pulses.     Heart sounds: No murmur heard.    No gallop.  Pulmonary:     Effort: Pulmonary effort is normal. No respiratory distress.     Breath sounds: Normal breath sounds.  Abdominal:     General: Bowel sounds are normal. There is no distension.     Palpations: Abdomen is soft.     Tenderness: There is no abdominal tenderness.  Extremities: no edema MsK: holding phone without difficulty, able to lift both legs but RLE weaker Musculoskeletal:        General: No swelling or deformity. Normal range of motion.     Cervical  back: Normal range of motion.  Skin:    General: Skin is warm and dry.  Neurological:     Mental Status: He is alert and oriented to person, place, and time.     Comments: Alert and oriented x 3. Normal insight and awareness. Intact Memory. Normal language and speech sl dysarthric. Cranial nerve exam unremarkable except for mild right central 7.. MMT: RUE 4 to 4+/5 prox to distal. LUE 5/5. RLE 2/5 HF, 1+ to 2- KE and 0/5 ADF/PF. LLE 5/5. Sensory exam normal for light touch and pain in all 4 limbs. No limb ataxia or cerebellar signs. No abnormal tone appreciated.   Ambulating S with foot drop     Psychiatric:        Mood and Affect: Mood normal.        Behavior: Behavior normal.  Assessment/Plan: 1. Functional deficits which require 3+  hours per day of interdisciplinary therapy in a comprehensive inpatient rehab setting. Physiatrist is providing close team supervision and 24 hour management of active medical problems listed below. Physiatrist and rehab team continue to assess barriers to discharge/monitor patient progress toward functional and medical goals  Care Tool:  Bathing    Body parts bathed by patient: Right arm, Left arm, Chest, Abdomen, Front perineal area, Right upper leg, Left upper leg, Face, Right lower leg, Left lower leg   Body parts bathed by helper: Buttocks     Bathing assist Assist Level: Minimal Assistance - Patient > 75%     Upper Body Dressing/Undressing Upper body dressing   What is the patient wearing?: Pull over shirt    Upper body assist Assist Level: Supervision/Verbal cueing    Lower Body Dressing/Undressing Lower body dressing      What is the patient wearing?: Pants     Lower body assist Assist for lower body dressing: Supervision/Verbal cueing     Toileting Toileting    Toileting assist Assist for toileting: Supervision/Verbal cueing     Transfers Chair/bed transfer  Transfers assist     Chair/bed transfer assist level:  Supervision/Verbal cueing     Locomotion Ambulation   Ambulation assist      Assist level: Supervision/Verbal cueing Assistive device: Cane-straight Max distance: 111ft   Walk 10 feet activity   Assist     Assist level: Supervision/Verbal cueing Assistive device: Cane-straight   Walk 50 feet activity   Assist    Assist level: Supervision/Verbal cueing Assistive device: Cane-straight    Walk 150 feet activity   Assist    Assist level: Supervision/Verbal cueing Assistive device: Cane-straight    Walk 10 feet on uneven surface  activity   Assist     Assist level: Minimal Assistance - Patient > 75%     Wheelchair     Assist Is the patient using a wheelchair?: Yes Type of Wheelchair: Manual    Wheelchair assist level: Minimal Assistance - Patient > 75% Max wheelchair distance: 150 ft    Wheelchair 50 feet with 2 turns activity    Assist        Assist Level: Minimal Assistance - Patient > 75%   Wheelchair 150 feet activity     Assist      Assist Level: Minimal Assistance - Patient > 75%   Blood pressure (!) 156/101, pulse 86, temperature 97.6 F (36.4 C), resp. rate 18, height 5\' 7"  (1.702 m), weight 65.5 kg, SpO2 100%.  Medical Problem List and Plan: 1. 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 Will likely need AFO for gait  -Continue CIR  D/c IV  Metanx started  Team conference today  2.  Impaired mobility: d/c lovenox since ambulating 300 feet             -antiplatelet therapy: ASA/Plavix DAPT X 3 months followed by ASA alone.   3. Low back pain: Tylenol prn mild and ultram prn moderate pain, kpad ordered             --continue Lidoderm patches for local measure. Decrease tramadol to q8H prn Prescribing Home Zynex NexWave Stimulator Device and supplies as needed. IFC,  NMES and TENS medically necessary Treatment Rx: Daily @ 30-40 minutes per treatment PRN. Zynex NexWave only, no substitutions. Treatment Goals: 1) To reduce and/or eliminate pain 2) To improve functional capacity and Activities of daily living 3) To reduce or prevent the need for oral medications 4) To improve circulation in the injured region 5) To decrease or prevent muscle spasm and muscle atrophy 6) To provide a self-management tool to the patient The patient has not sufficiently improved with conservative care. Numerous studies indexed by Medline and PubMed.gov have shown Neuromuscular, Interferential, and TENS stimulators to reduce pain, improve function, and reduce medication use in injured patients. Continued use of this evidence based, safe, drug free treatment is both reasonable and medically necessary at this time.   4. Insomnia: continue trazodone prn 5. Neuropsych/cognition: This patient is capable of making decisions on his own behalf. 6. Skin/Wound Care: Routine pressure relief measures.  7. Fluids/Electrolytes/Nutrition: Monitor I/O. Monitor labs  8. HTN: Monitor BP TID, continue Amlodipine 10mg  daily, Cozaar 100mg  daily, Increase lopressor to 50mg  BID             -BP poorly controlled at present             -add hydralazine 10mg  q6h PRN for BP > 199/99  Increase magnesium gluconate to 500mg  HS Vitals:   05/02/23 0525 05/02/23 0636 05/02/23 1247 05/02/23 2029  BP: (!) 161/106 (!) 166/104 (!) 132/96 (!) 150/93   05/03/23 0451 05/03/23 0700 05/03/23 1325 05/03/23 1929  BP: (!) 149/85 (!) 147/112 130/86 (!) 134/92   05/04/23 0501 05/04/23 1318 05/04/23 1946 05/05/23 0523  BP: (!) 135/95 117/88 (!) 137/99 (!) 156/101    9. CAD: Monitor for symptoms with increase in activity.  --daily wts. Monitor for signs of overload.              -- On Cozaar, metoprolol, Crestor 40mg  QD  -05/02/23 wt a little down, monitor Filed Weights   05/02/23 0500 05/03/23 0634 05/05/23 0902  Weight:  65.8 kg 65.1 kg 65.5 kg    10 Pre-renal azotemia: Encourage fluid intake, Cr 1.42 on 05/01/23.  11. Tobacco dependence: Continue nicotine patch 21mg . Changed to daily rather than PRN, per request             --educate on importance of tobacco cessation 12. Aortoiliac disease w/ occluded Left CIA/infrainguinal dz:  NO rest pain. Continue ASA and Lipitor.              --smoking cessation.  --follow up with Dr. Randie Heinz after discharge  13. Suboptimal Vitamin D: 05/01/23 level 26.64, will continue supplementation 50000U weekly x7wks  >50 minutes spent discussing nexwave, foot up brace, ordering brace, team conference, review of blood pressures, increasing lopressor to 50mg  BID, trial of nexwave, review of chart  LOS: 5 days A FACE TO FACE EVALUATION WAS PERFORMED  Clint Bolder P Kinzee Happel 05/05/2023, 10:27 AM

## 2023-05-05 NOTE — Progress Notes (Signed)
Physical Therapy Session Note  Patient Details  Name: Lance Santiago MRN: [id number] Date of Birth: 16-Sep-1960  Today's Date: 05/05/2023 PT Individual Time: 7425-9563 PT Individual Time Calculation (min): 56 min   Short Term Goals: Week 1:  PT Short Term Goal 1 (Week 1): Pt will be able to STS w/ CGA using LRAD PT Short Term Goal 2 (Week 1): Pt will be able to perform bed to chair w/ CGA using LRAD PT Short Term Goal 3 (Week 1): Pt will ambulate 300 ft CGA w/ LRAD PT Short Term Goal 4 (Week 1): Pt perform 12 stairs CGA w/ LRAD  Skilled Therapeutic Interventions/Progress Updates:  Received pt sitting in recliner, pt agreeable to PT treatment, and reported pain 3/10 in low back. Pt reports not receiving morning meds including BP medication - RN notified and present to administer medications. Session with emphasis on functional mobility/transfers, generalized strengthening and endurance, dynamic standing balance, and gait training.   Pt does not have appropriate shoes in hospital and has no way of getting anyone to bring him any. Ace wrapped RLE into DF and RN arrived to administer medications. Pt performed all transfers without AD and CGA/close supervision throughout session. Pt ambulated 167ft x 2 trials without AD and CGA to/from main therapy gym - stood and talked with MD for morning rounds. Removed ace wrap and ambulated additional 230ft without AD and CGA fading to close supervision. Pt able to clear R foot but required increased effort/focus. Provided pt with SPC and pt ambulated additional 222ft with SPC and supervision - pt verbalized feeling more stable and support from cane helped his low back - placed order for [facility]. Discussed leaving ace wrap off permanently and working on DF muscle activation without assist and pt in agreement.   Pt reports walking backwards continues to be difficult for him - therefore worked on retro walking with 1UE support on handrail and light min A to  facilitate knee flexion. Noted hamstring tightness and lack of glute strength - demonstrated seated hamstring stretch and seated knee flexion exercise for pt to work on in between therapies. Concluded session with pt sitting in recliner, needs within reach, and seatbelt alarm on.   Vitals: BP sitting in recliner: 140/101  Therapy Documentation Precautions:  Precautions Precautions: Fall, Other (comment) Precaution Comments: BP <180/105 Restrictions Weight Bearing Restrictions: No  Therapy/Group: Individual Therapy Marlana Salvage Zaunegger Blima Rich PT, DPT 05/05/2023, 6:51 AM

## 2023-05-06 ENCOUNTER — Other Ambulatory Visit (HOSPITAL_COMMUNITY): Payer: Self-pay

## 2023-05-06 DIAGNOSIS — I63522 Cerebral infarction due to unspecified occlusion or stenosis of left anterior cerebral artery: Secondary | ICD-10-CM | POA: Diagnosis not present

## 2023-05-06 NOTE — Progress Notes (Signed)
Occupational Therapy Discharge Summary  Patient Details  Name: Lance Santiago MRN: [id number] Date of Birth: [birth year]  Date of Discharge from OT service:May 06, 2023   Patient has met 9 of 9 long term goals due to improved activity tolerance, improved balance, functional use of  RIGHT upper and RIGHT lower extremity, improved awareness, and improved coordination.  Patient to discharge at overall Modified Independent to supervision level.  Patient's care partner is independent to provide the necessary physical assistance at discharge.    All goals met  Recommendation:  No follow up recommended  Equipment: TTB  Reasons for discharge: treatment goals met  Patient/family agrees with progress made and goals achieved: Yes  OT Discharge Precautions/Restrictions  Precautions Precautions: Fall;Other (comment) Precaution Comments: BP <180/105 Restrictions Weight Bearing Restrictions: No ADL ADL Equipment Provided: Long-handled sponge Eating: Independent Where Assessed-Eating: Chair Grooming: Independent Where Assessed-Grooming: Standing at sink Upper Body Bathing: Independent Where Assessed-Upper Body Bathing: Shower Lower Body Bathing: Modified independent Where Assessed-Lower Body Bathing: Shower Upper Body Dressing: Independent Where Assessed-Upper Body Dressing: Chair Lower Body Dressing: Modified independent Where Assessed-Lower Body Dressing: Chair Toileting: Modified independent Where Assessed-Toileting: Neurosurgeon Method: Proofreader: Raised toilet seat, Grab bars, Other (comment) (SPC) Tub/Shower Transfer: Modified independent Tub/Shower Transfer Method: Sit pivot, Ambulating Tub/Shower Equipment: Insurance underwriter: Close supervision Film/video editor Method: Designer, industrial/product: Emergency planning/management officer, Grab bars, Other (comment)  (SPC) Vision Baseline Vision/History: 1 Wears glasses Patient Visual Report: No change from baseline Vision Assessment?: No apparent visual deficits Perception  Perception: Impaired Perception-Other Comments: Mild Rt inattention and spatial awareness Praxis Praxis: WFL Cognition Cognition Overall Cognitive Status: Within Functional Limits for tasks assessed Arousal/Alertness: Awake/alert Orientation Level: Person;Place;Situation Person: Oriented Place: Oriented Situation: Oriented Memory: Appears intact Awareness: Appears intact Problem Solving: Appears intact Safety/Judgment: Appears intact Brief Interview for Mental Status (BIMS) Repetition of Three Words (First Attempt): 3 Temporal Orientation: Year: Correct Temporal Orientation: Month: Accurate within 5 days Temporal Orientation: Day: Correct Recall: "Sock": Yes, no cue required Recall: "Blue": Yes, no cue required Recall: "Bed": Yes, no cue required BIMS Summary Score: 15 Sensation Sensation Light Touch: Appears Intact Hot/Cold: Not tested Proprioception: Appears Intact Stereognosis: Not tested Coordination Gross Motor Movements are Fluid and Coordinated: No Fine Motor Movements are Fluid and Coordinated: Yes Coordination and Movement Description: mild R hemi Finger Nose Finger Test: WFL bilaterally Heel Shin Test: WFL on LLE unable to perfom without UE assist on RLE Motor  Motor Motor: Hemiplegia Motor - Skilled Clinical Observations: mild R hemi Mobility  Bed Mobility Bed Mobility: Rolling Right;Rolling Left;Sit to Supine;Supine to Sit Rolling Right: Independent Rolling Left: Independent Supine to Sit: Independent Sit to Supine: Independent Transfers Sit to Stand: Independent with assistive device Stand to Sit: Independent with assistive device  Trunk/Postural Assessment  Cervical Assessment Cervical Assessment: Within Functional Limits Thoracic Assessment Thoracic Assessment: Exceptions to [facility]  (mild thoracic rounding) Lumbar Assessment Lumbar Assessment: Within Functional Limits Postural Control Postural Control: Within Functional Limits  Balance Balance Balance Assessed: Yes Static Sitting Balance Static Sitting - Balance Support: Feet supported;No upper extremity supported Static Sitting - Level of Assistance: 7: Independent Dynamic Sitting Balance Dynamic Sitting - Balance Support: Feet supported;No upper extremity supported Dynamic Sitting - Level of Assistance: 7: Independent Static Standing Balance Static Standing - Balance Support: Right upper extremity supported;During functional activity (SPC) Static Standing - Level of Assistance: 6: Modified independent (Device/Increase time) Dynamic Standing Balance  Dynamic Standing - Balance Support: No upper extremity supported;Right upper extremity supported (SPC) Dynamic Standing - Level of Assistance: 6: Modified independent (Device/Increase time) Dynamic Standing - Comments: with transfers and gait Extremity/Trunk Assessment RUE Assessment RUE Assessment: Within Functional Limits LUE Assessment LUE Assessment: Within Functional Limits   Rainn Zupko E Julez Huseby, MS, OTR/L  05/06/2023, 12:15 PM

## 2023-05-06 NOTE — Progress Notes (Signed)
PROGRESS NOTE   Subjective/Complaints: Notes that Prevalon was given instead of PRAFO, messaged James regarding need for smaller (medium sized) PRAFO Asks about discharge medications  ROS: as per HPI. Denies CP, SOB, abd pain, N/V/D/C, or any other complaints at this time.    Objective:   No results found. No results for input(s): "WBC", "HGB", "HCT", "PLT" in the last 72 hours.  No results for input(s): "NA", "K", "CL", "CO2", "GLUCOSE", "BUN", "CREATININE", "CALCIUM" in the last 72 hours.    Intake/Output Summary (Last 24 hours) at 05/06/2023 0956 Last data filed at 05/05/2023 1822 Gross per 24 hour  Intake 960 ml  Output --  Net 960 ml        Physical Exam: Vital Signs Blood pressure (!) 149/97, pulse 79, temperature 97.8 F (36.6 C), resp. rate 18, height 5\' 7"  (1.702 m), weight 65.6 kg, SpO2 100%.  Physical Exam Vitals and nursing note reviewed. BMI 22.62 Constitutional:      Appearance: Normal appearance. Sitting in bedside chair HENT:     Head: Normocephalic.     Right Ear: External ear normal.     Left Ear: External ear normal.     Nose: Nose normal.     Mouth/Throat:     Comments: Missing numerous teeth Eyes:     Pupils: Pupils are equal, round, and reactive to light.  Cardiovascular:     Rate and Rhythm: Normal rate and regular rhythm.     Pulses: Normal pulses.     Heart sounds: No murmur heard.    No gallop.  Pulmonary:     Effort: Pulmonary effort is normal. No respiratory distress.     Breath sounds: Normal breath sounds.  Abdominal:     General: Bowel sounds are normal. There is no distension.     Palpations: Abdomen is soft.     Tenderness: There is no abdominal tenderness.  Extremities: no edema MsK: holding phone without difficulty, able to lift both legs but RLE weaker Musculoskeletal:        General: No swelling or deformity. Normal range of motion.     Cervical back: Normal range  of motion.  Skin:    General: Skin is warm and dry.  Neurological:     Mental Status: He is alert and oriented to person, place, and time.     Comments: Alert and oriented x 3. Normal insight and awareness. Intact Memory. Normal language and speech sl dysarthric. Cranial nerve exam unremarkable except for mild right central 7.. MMT: RUE 4 to 4+/5 prox to distal. LUE 5/5. RLE 2/5 HF, 1+ to 2- KE and 0/5 ADF/PF. LLE 5/5. Sensory exam normal for light touch and pain in all 4 limbs. No limb ataxia or cerebellar signs. No abnormal tone appreciated.   Ambulating S with foot drop Transfers independently     Psychiatric:        Mood and Affect: Mood normal.        Behavior: Behavior normal.  Assessment/Plan: 1. Functional deficits which require 3+ hours per day of interdisciplinary therapy in a comprehensive inpatient rehab setting. Physiatrist is providing close team supervision and 24 hour management of active medical problems listed  below. Physiatrist and rehab team continue to assess barriers to discharge/monitor patient progress toward functional and medical goals  Care Tool:  Bathing    Body parts bathed by patient: Right arm, Left arm, Chest, Abdomen, Front perineal area, Right upper leg, Left upper leg, Face, Right lower leg, Left lower leg, Buttocks   Body parts bathed by helper: Buttocks     Bathing assist Assist Level: Independent with assistive device     Upper Body Dressing/Undressing Upper body dressing   What is the patient wearing?: Pull over shirt    Upper body assist Assist Level: Independent    Lower Body Dressing/Undressing Lower body dressing      What is the patient wearing?: Pants     Lower body assist Assist for lower body dressing: Independent with assitive device     Toileting Toileting    Toileting assist Assist for toileting: Independent with assistive device     Transfers Chair/bed transfer  Transfers assist     Chair/bed transfer assist  level: Supervision/Verbal cueing     Locomotion Ambulation   Ambulation assist      Assist level: Supervision/Verbal cueing Assistive device: Cane-straight Max distance: 183ft   Walk 10 feet activity   Assist     Assist level: Supervision/Verbal cueing Assistive device: Cane-straight   Walk 50 feet activity   Assist    Assist level: Supervision/Verbal cueing Assistive device: Cane-straight    Walk 150 feet activity   Assist    Assist level: Supervision/Verbal cueing Assistive device: Cane-straight    Walk 10 feet on uneven surface  activity   Assist     Assist level: Minimal Assistance - Patient > 75%     Wheelchair     Assist Is the patient using a wheelchair?: Yes Type of Wheelchair: Manual    Wheelchair assist level: Minimal Assistance - Patient > 75% Max wheelchair distance: 150 ft    Wheelchair 50 feet with 2 turns activity    Assist        Assist Level: Minimal Assistance - Patient > 75%   Wheelchair 150 feet activity     Assist      Assist Level: Minimal Assistance - Patient > 75%   Blood pressure (!) 149/97, pulse 79, temperature 97.8 F (36.6 C), resp. rate 18, height 5\' 7"  (1.702 m), weight 65.6 kg, SpO2 100%.  Medical Problem List and Plan: 1. 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 Will likely need AFO for gait  -Continue CIR  D/c IV  Metanx started  Discussed d/c meds  2.  Impaired mobility: d/c lovenox since ambulating 300 feet             -antiplatelet therapy: ASA/Plavix DAPT X 3 months followed by ASA alone.   3. Low back pain: Tylenol prn mild and ultram prn moderate pain, kpad ordered             --continue Lidoderm patches for local measure. Decrease tramadol to q8H prn Prescribing Home Zynex NexWave Stimulator Device and supplies as needed. IFC,  NMES and TENS medically necessary Treatment Rx: Daily @ 30-40 minutes per treatment PRN. Zynex NexWave only, no substitutions. Treatment Goals: 1) To reduce  and/or eliminate pain 2) To improve functional capacity and Activities of daily living 3) To reduce or prevent the need for oral medications 4) To improve circulation in the injured region 5) To decrease or prevent muscle spasm and muscle atrophy 6) To provide a self-management tool to the patient The patient has not sufficiently improved with conservative care. Numerous studies indexed by Medline and PubMed.gov have shown Neuromuscular, Interferential, and TENS stimulators to reduce pain, improve function, and reduce medication use in injured patients. Continued use of this evidence based, safe, drug free treatment is both reasonable and medically necessary at this time.   4. Insomnia: continue trazodone prn 5. Neuropsych/cognition: This patient is capable of making decisions on his own behalf. 6. Skin/Wound Care: Routine pressure relief measures.  7. Fluids/Electrolytes/Nutrition: Monitor I/O. Monitor labs  8. HTN: Monitor BP TID, continue Amlodipine 10mg  daily, Cozaar 100mg  daily, continue lopressor 50mg  BID             -BP poorly controlled at present             -add hydralazine 10mg  q6h PRN for BP > 199/99  Increase magnesium gluconate to 500mg  HS Vitals:   05/03/23 0451 05/03/23 0700 05/03/23 1325 05/03/23 1929  BP: (!) 149/85 (!) 147/112 130/86 (!) 134/92   05/04/23 0501 05/04/23 1318 05/04/23 1946 05/05/23 0523  BP: (!) 135/95 117/88 (!) 137/99 (!) 156/101   05/05/23 1329 05/05/23 1950 05/06/23 0344 05/06/23 0908  BP: 120/86 (!) 143/97 (!) 160/104 (!) 149/97    9. CAD: Monitor for symptoms with increase in activity.  --daily wts. Monitor for signs of overload.              -- On Cozaar, metoprolol, Crestor 40mg  QD  -05/02/23 wt a little down, monitor Filed Weights   05/03/23 0634 05/05/23 0902 05/06/23 0345  Weight: 65.1 kg  65.5 kg 65.6 kg    10 Pre-renal azotemia: Encourage fluid intake, Cr 1.42 on 05/01/23.  11. Tobacco dependence: Continue nicotine patch 21mg . Changed to daily rather than PRN, per request             --educate on importance of tobacco cessation 12. Aortoiliac disease w/ occluded Left CIA/infrainguinal dz:  NO rest pain. Continue ASA and Lipitor.              --smoking cessation.  --follow up with Dr. Randie Heinz after discharge  13. Suboptimal Vitamin D: 05/01/23 level 26.64, will continue supplementation 50000U weekly x7wks   LOS: 6 days A FACE TO FACE EVALUATION WAS PERFORMED  Lance Santiago 05/06/2023, 9:56 AM

## 2023-05-06 NOTE — Plan of Care (Signed)
  Problem: RH Balance Goal: LTG Patient will maintain dynamic standing with ADLs (OT) Description: LTG:  Patient will maintain dynamic standing balance with assist during activities of daily living (OT)  Outcome: Completed/Met   Problem: Sit to Stand Goal: LTG:  Patient will perform sit to stand in prep for activites of daily living with assistance level (OT) Description: LTG:  Patient will perform sit to stand in prep for activites of daily living with assistance level (OT) Outcome: Completed/Met   Problem: RH Bathing Goal: LTG Patient will bathe all body parts with assist levels (OT) Description: LTG: Patient will bathe all body parts with assist levels (OT) Outcome: Completed/Met   Problem: RH Dressing Goal: LTG Patient will perform lower body dressing w/assist (OT) Description: LTG: Patient will perform lower body dressing with assist, with/without cues in positioning using equipment (OT) Outcome: Completed/Met   Problem: RH Toileting Goal: LTG Patient will perform toileting task (3/3 steps) with assistance level (OT) Description: LTG: Patient will perform toileting task (3/3 steps) with assistance level (OT)  Outcome: Completed/Met   Problem: RH Functional Use of Upper Extremity Goal: LTG Patient will use RT/LT upper extremity as a (OT) Description: LTG: Patient will use right/left upper extremity as a stabilizer/gross assist/diminished/nondominant/dominant level with assist, with/without cues during functional activity (OT) Outcome: Completed/Met   Problem: RH Simple Meal Prep Goal: LTG Patient will perform simple meal prep w/assist (OT) Description: LTG: Patient will perform simple meal prep with assistance, with/without cues (OT). Outcome: Completed/Met   Problem: RH Toilet Transfers Goal: LTG Patient will perform toilet transfers w/assist (OT) Description: LTG: Patient will perform toilet transfers with assist, with/without cues using equipment (OT) Outcome:  Completed/Met   Problem: RH Tub/Shower Transfers Goal: LTG Patient will perform tub/shower transfers w/assist (OT) Description: LTG: Patient will perform tub/shower transfers with assist, with/without cues using equipment (OT) Outcome: Completed/Met

## 2023-05-06 NOTE — Progress Notes (Signed)
Inpatient Rehabilitation Care Coordinator Discharge Note   Patient Details  Name: Lance Santiago MRN: [id number] Date of Birth: [birth year]   Discharge location: Home  Length of Stay: 7 Days  Discharge activity level: Sup/Mod I  Home/community participation: S.O, brother, son and son's GF.  Patient response GU:[id number] Literacy - How often do you need to have someone help you when you read instructions, pamphlets, or other written material from your doctor or pharmacy?: Rarely  Patient response QQ:[id number] Isolation - How often do you feel lonely or isolated from those around you?: Never  Services provided included: SW, Neuropsych, Pharmacy, CM, TR, RN, OT, SLP, PT, RD, MD  Financial Services:  Financial Services Utilized:  (uninsured)    Choices offered to/list presented to: patient  Follow-up services arranged:  DME      DME : [employer]    Patient response to transportation need: Is the patient able to respond to transportation needs?: Yes In the past 12 months, has lack of transportation kept you from medical appointments or from getting medications?: No In the past 12 months, has lack of transportation kept you from meetings, work, or from getting things needed for daily living?: No   Patient/Family verbalized understanding of follow-up arrangements:  Yes  Individual responsible for coordination of the follow-up plan: self  Confirmed correct DME delivered: Andria Rhein 05/06/2023    Comments (or additional information):  Summary of Stay    Date/Time Discharge Planning CSW  05/04/23 1550 Discharging home with S.O, son and son's GF to provide 24/7 supervision. S.O works 3rd shift and son works 1st shift). 2 steps to enter home. Patient uninsured. CJB       Andria Rhein

## 2023-05-06 NOTE — Evaluation (Signed)
Recreational Therapy Assessment and Plan  Patient Details  Name: Lance Santiago MRN: [id number] Date of Birth: 12-Jun-1961 Today's Date: 05/06/2023  Rehab Potential:  Good ELOS:   d/c 9/27  Assessment  Hospital Problem: Principal Problem:   Acute ischemic left ACA stroke [facility])     Past Medical History:      Past Medical History:  Diagnosis Date   Coronary artery disease     Hyperlipidemia     Hypertension     Lumbar disc disease     Myocardial infarction (HCC)      9/18 PCI/DESx1 to RCA, EF 45%   Smoking     Systolic heart failure (HCC)          Past Surgical History:       Past Surgical History:  Procedure Laterality Date   CARDIAC CATHETERIZATION       CORONARY/GRAFT ACUTE MI REVASCULARIZATION N/A 04/25/2017    Procedure: Coronary/Graft Acute MI Revascularization;  Surgeon: Runell Gess, MD;  Location: [facility] INVASIVE CV LAB;  Service: Cardiovascular;  Laterality: N/A;   KNEE SURGERY       LEFT HEART CATH AND CORONARY ANGIOGRAPHY N/A 04/25/2017    Procedure: LEFT HEART CATH AND CORONARY ANGIOGRAPHY;  Surgeon: Runell Gess, MD;  Location: [facility] INVASIVE CV LAB;  Service: Cardiovascular;  Laterality: N/A;   LUMBAR LAMINECTOMY              Assessment & Plan Clinical Impression: Patient is a 62 y.o. year old male with history of CAD, HTN, Systolic CHF, tobacco use- I PPD who was admitted on 04/26/23 with right sided weakness and coolness RLE. CTA head was negative for LVO and showed severe stenosis distal R-P3 segment and left P1 segment, tiny 2 mm aneurysm and carotid bifurcation showed 70% stenosis proximal R-ICA and 50% stenosis proximal L-ICA. 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 UDS positive for THC. MRI brain showed L-ACA territory infarct. 2D echo showed EF 50-55% with now all abnormality and no shunt.  Patient noted to have RUE/RLE weakness which is resolving.  Patient transferred to CIR on 04/30/2023 .    Met with pt today to discuss TR services including leisure education, activity analysis/modifications and stress management.  Also discussed the importance of social, emotional, spiritual health in addition to physical health and their effects on overall health and wellness.  Pt stated understanding.  Plan  No further TR   Recommendations for other services: None   Discharge Criteria: Patient will be discharged from TR if patient refuses treatment 3 consecutive times without medical reason.  If treatment goals not met, if there is a change in medical status, if patient makes no progress towards goals or if patient is discharged from hospital.  The above assessment, treatment plan, treatment alternatives and goals were discussed and mutually agreed upon: by patient  Korianna Washer 05/06/2023, 12:45 PM

## 2023-05-06 NOTE — Progress Notes (Signed)
Orthopedic Tech Progress Note Patient Details:  [name] Apr 08, 1961 [id number]  [facility] representative is aware of the AFO order and will be there this morning.  Patient ID: Lance Santiago, male   DOB: 07-18-1961, 61 y.o.   MRN: [id number]  [name] 05/06/2023, 10:28 AM

## 2023-05-06 NOTE — Plan of Care (Signed)
  Problem: Consults Goal: RH STROKE PATIENT EDUCATION Description: See Patient Education module for education specifics  Outcome: Progressing   Problem: RH SAFETY Goal: RH STG ADHERE TO SAFETY PRECAUTIONS W/ASSISTANCE/DEVICE Description: STG Adhere to Safety Precautions With cues Assistance/Device. Outcome: Progressing   Problem: RH PAIN MANAGEMENT Goal: RH STG PAIN MANAGED AT OR BELOW PT'S PAIN GOAL Description: < 4 with prns Outcome: Progressing   Problem: RH KNOWLEDGE DEFICIT Goal: RH STG INCREASE KNOWLEDGE OF DIABETES Description: Patient and significant other will be able to manage DM with medications and dietary modifications using educational resources independently Outcome: Progressing Goal: RH STG INCREASE KNOWLEDGE OF HYPERTENSION Description: Patient and significant other will be able to manage HTN with medications and dietary modifications using educational resources independently Outcome: Progressing Goal: RH STG INCREASE KNOWLEGDE OF HYPERLIPIDEMIA Description: Patient and significant other will be able to manage HLD with medications and dietary modifications using educational resources independently Outcome: Progressing Goal: RH STG INCREASE KNOWLEDGE OF STROKE PROPHYLAXIS Description: Patient and significant other will be able to manage secondary stroke risks with medications and dietary modifications using educational resources independently Outcome: Progressing

## 2023-05-06 NOTE — Progress Notes (Addendum)
Patient ID: Lance Santiago, male   DOB: Apr 09, 1961, 62 y.o.   MRN: [id number]  MATCH info submitted to [facility] pharmacy.   ID: [id number]  Rx BIN: [id number]  Rx Group: [id number]  Rx PCN: [id number]

## 2023-05-06 NOTE — Progress Notes (Signed)
Physical Therapy Discharge Summary  Patient Details  Name: Lance Santiago MRN: [id number] Date of Birth: 06/08/1961  Date of Discharge from PT service:May 06, 2023  Today's Date: 05/06/2023 PT Individual Time: 1100-1154 PT Individual Time Calculation (min): 54 min   Patient has met 7 of 8 long term goals due to improved activity tolerance, improved balance, improved postural control, increased strength, increased range of motion, ability to compensate for deficits, improved attention, improved awareness, and improved coordination. Patient to discharge at an ambulatory level Modified Independent using SPC.     Reasons goals not met: Pt did not meet stair goal of mod I as pt current requires CGA to navigate 12 6in steps with SPC due to RLE weakness and decreased balance/coordination.   Recommendation:  Patient will benefit from ongoing skilled PT services in outpatient setting to continue to advance safe functional mobility, address ongoing impairments in transfers, RLE strengthening and endurance, dynamic standing balance/coordination, NMR, gait training, and to minimize fall risk. However, pt uninsured and most likely will not receive follow up therapy.   Equipment: [facility]  Reasons for discharge: treatment goals met and discharge from hospital  Patient/family agrees with progress made and goals achieved: Yes  Today's Interventions: Received pt sitting in recliner, pt agreeable to PT treatment, and did not state pain level during session. Session with emphasis on discharge planning, functional mobility/transfers, global strengthening and endurance, dynamic standing balance/coordination, gait training, stair navigation, and simulated car transfers.   Went through sensation, MMT, and pain interference questionnaire. Pt performed all transfers with [facility] and mod I throughout session. Pt ambulated 135ft with SPC and mod I to main therapy gym. Pt navigated 12 6in steps with SPC and  supervision ascending and descending with a step through pattern - cues to keep SPC on step below when descending, using a step to pattern for safety, ensuring foot is completely on step, and "up with the good, down with the bad" technique - pt with 2 instances foot slipping off step, requiring CGA to correct.   Pt then ambulated 122ft with SPC and mod I to ortho gym and performed ambulatory simulated car transfer with SPC and mod I opting to enter via side step method. Pt then ambulated 45ft on uneven surfaces (ramp and mulch), and navigated 1 5in curb with SPC and supervision due to slight unsteadiness. Pt ambulated 17ft with SPC and mod I back to main gym and able to stand and pick up object from floor with SPC and mod I.   Discussed floor recovery and technique for getting off floor if pt were to fall - emphasized importance of making sure he wasn't injured or hit his head and if so - to call 911 and not attempt to get up. Pt performed floor transfer with supervision and increased time due to RLE weakness. Pt ambulated 16ft with SPC and mod I back to room. Concluded session with pt sitting in recliner with all needs within reach. Pt mod I in room.    PT Discharge Precautions/Restrictions Precautions Precautions: Fall;Other (comment) Precaution Comments: BP <180/105 Restrictions Weight Bearing Restrictions: No Pain Interference Pain Interference Pain Effect on Sleep: 1. Rarely or not at all Pain Interference with Therapy Activities: 1. Rarely or not at all Pain Interference with Day-to-Day Activities: 1. Rarely or not at all Cognition Overall Cognitive Status: Within Functional Limits for tasks assessed Arousal/Alertness: Awake/alert Orientation Level: Oriented X4 Memory: Appears intact Awareness: Appears intact Problem Solving: Appears intact Safety/Judgment: Appears intact Sensation Sensation Light  Touch: Appears Intact Hot/Cold: Not tested Proprioception: Appears  Intact Stereognosis: Not tested Coordination Gross Motor Movements are Fluid and Coordinated: No Fine Motor Movements are Fluid and Coordinated: Yes Coordination and Movement Description: mild R hemi Finger Nose Finger Test: WFL bilaterally Heel Shin Test: WFL on LLE unable to perfom without UE assist on RLE Motor  Motor Motor: Hemiplegia Motor - Skilled Clinical Observations: mild R hemi  Mobility Bed Mobility Bed Mobility: Rolling Right;Rolling Left;Sit to Supine;Supine to Sit Rolling Right: Independent Rolling Left: Independent Supine to Sit: Independent Sit to Supine: Independent Transfers Transfers: Sit to Stand;Stand to Sit;Stand Pivot Transfers Sit to Stand: Independent with assistive device Stand to Sit: Independent with assistive device Stand Pivot Transfers: Independent with assistive device Transfer (Assistive device): Straight cane Locomotion  Gait Ambulation: Yes Gait Assistance: Independent with assistive device Gait Distance (Feet): 150 Feet Assistive device: Straight cane Gait Gait: Yes Gait Pattern: Impaired Gait Pattern: Step-through pattern;Decreased step length - right;Decreased hip/knee flexion - right;Decreased dorsiflexion - right;Decreased weight shift to right;Poor foot clearance - right;Poor foot clearance - left;Narrow base of support Gait velocity: decreased Stairs / Additional Locomotion Stairs: Yes Stairs Assistance: Contact Guard/Touching assist Stair Management Technique: With cane Number of Stairs: 12 Height of Stairs: 6 Ramp: Supervision/Verbal cueing (SPC) Curb: Supervision/Verbal cueing (SPC) Pick up small object from the floor assist level: Independent with assistive device Wheelchair Mobility Wheelchair Mobility: No  Trunk/Postural Assessment  Cervical Assessment Cervical Assessment: Within Functional Limits Thoracic Assessment Thoracic Assessment: Exceptions to [facility] (mild thoracic rounding) Lumbar Assessment Lumbar Assessment:  Within Functional Limits Postural Control Postural Control: Within Functional Limits  Balance Balance Balance Assessed: Yes Static Sitting Balance Static Sitting - Balance Support: Feet supported;No upper extremity supported Static Sitting - Level of Assistance: 7: Independent Dynamic Sitting Balance Dynamic Sitting - Balance Support: Feet supported;No upper extremity supported Dynamic Sitting - Level of Assistance: 7: Independent Static Standing Balance Static Standing - Balance Support: Right upper extremity supported;During functional activity (SPC) Static Standing - Level of Assistance: 6: Modified independent (Device/Increase time) Dynamic Standing Balance Dynamic Standing - Balance Support: No upper extremity supported;Right upper extremity supported (SPC) Dynamic Standing - Level of Assistance: 6: Modified independent (Device/Increase time) Dynamic Standing - Comments: with transfers and gait Extremity Assessment  RLE Assessment RLE Assessment: Exceptions to [facility] General Strength Comments: tested sitting in recliner RLE Strength Right Hip Flexion: 3+/5 Right Hip ABduction: 3-/5 Right Hip ADduction: 3-/5 Right Knee Flexion: 1/5 Right Knee Extension: 3+/5 Right Ankle Dorsiflexion: 1/5 Right Ankle Plantar Flexion: 1/5 LLE Assessment LLE Assessment: Exceptions to [facility] General Strength Comments: tested sitting in recliner LLE Strength Left Hip Flexion: 4/5 Left Hip ABduction: 3+/5 Left Hip ADduction: 3+/5 Left Knee Flexion: 4/5 Left Knee Extension: 4/5 Left Ankle Dorsiflexion: 4-/5 Left Ankle Plantar Flexion: 4/5   Marlana Salvage Zaunegger Blima Rich PT, DPT 05/06/2023, 7:21 AM

## 2023-05-06 NOTE — Progress Notes (Signed)
Speech Language Pathology Discharge Summary  Patient Details  Name: Lance Santiago MRN: [id number] Date of Birth: Jul 23, 1961  Date of Discharge from SLP service:May 06, 2023  Today's Date: 05/06/2023 SLP Individual Time: 1410-1445 SLP Individual Time Calculation (min): 35 min  Skilled Therapeutic Interventions:  SLP conducted skilled therapy session targeting cognitive retraining goals. Upon initial entry, patient on commode requesting SLP return in a few minutes. Patient made independent in room by OT earlier this date, thus SLP abided patient's request and returned as patient was washing his hands. SLP and patient discussed impending discharge with patient expressing excitement. Patient reports that he feels his cognition is at baseline level of functioning with remaining attention deficits existing prior to this hospitalization. Patient completed complex problem solving worksheet requiring scanning, emergent awareness, and organization skills with supervisionA. Patient appropriate for discharge from CIR SLP services at this time. Patient left in chair with alarm unset given independent in room status.     Patient has met 3 of 4 long term goals.  Patient to discharge at overall Supervision;Modified Independent level.  Reasons goals not met: baseline deficits   Clinical Impression/Discharge Summary: Patient has made excellent gains towards therapy goals during admission, meeting 3/4 long term goals set. Patient discharging at overall supervision-modI level for cognitive tasks including modI for memory, supervision for sustained attention, modI for mildly complex problem solving, supervision for complex problem solving, and minA-supervision for emergent awareness during complex functional tasks. Patient continues to exhibit deficits in the areas of selective, divided, and alternating attention, and emergent awareness, though patient reports these are baseline deficits and independently  provides examples of compensation strategies he routinely utilizes. Patient would benefit from further SLP services to target attention deficits post-discharge. Patient and family education complete. Patient appropriate for discharge from SLP CIR services.    Care Partner:  Caregiver Able to Provide Assistance: Yes  Type of Caregiver Assistance: Cognitive  Recommendation:  Outpatient SLP  Rationale for SLP Follow Up: Maximize cognitive function and independence   Equipment: n/a   Reasons for discharge: Discharged from hospital   Patient/Family Agrees with Progress Made and Goals Achieved: Yes   Jeannie Done, M.A., CCC-SLP  Yetta Barre 05/06/2023, 2:32 PM

## 2023-05-06 NOTE — Progress Notes (Signed)
Physical Therapy Session Note  Patient Details  Name: JOVAN SCIBELLI MRN: [id number] Date of Birth: 11-May-1961  Today's Date: 05/06/2023 PT Individual Time: 4696-2952 PT Individual Time Calculation (min): 41 min   Short Term Goals: Week 1:  PT Short Term Goal 1 (Week 1): Pt will be able to STS w/ CGA using LRAD PT Short Term Goal 2 (Week 1): Pt will be able to perform bed to chair w/ CGA using LRAD PT Short Term Goal 3 (Week 1): Pt will ambulate 300 ft CGA w/ LRAD PT Short Term Goal 4 (Week 1): Pt perform 12 stairs CGA w/ LRAD  Skilled Therapeutic Interventions/Progress Updates:      Pt seated in recliner upon arrival. Pt agreeable to therapy. Pt denies any pain. Ambulation with SPC and mod I from room to main gym.   Patient demonstrates increased fall risk as noted by score of  46/56 on Berg Balance Scale.  (<36= high risk for falls, close to 100%; 37-45 significant >80%; 46-51 moderate >50%; 52-55 lower >25%). Education provided for moderate fall risk and need for cane for stability.   Pt ascended/descended 12 steps with SPC, and step to gait with CGA, pt demos recall of technique with stair navigation.   Pt performed heel raises and eccentric lower with B UE support on handrail, pt reports decent stretch in R LE, pt heavily dependent on B UE, L LE for heel raise.  Pt performed R LE toe taps 15 on 6 inch step with R UE support and CGA, verbal and tactile cues provided march for LE clearance versus hip flexion compensation to drag foot off of step, pt demos improved hip flexion with max verbal cues  Pt ambulated with SPC and mod I main gym to room. Pt seated in recliner with all needs within reach at end of session.    Therapy Documentation Precautions:  Precautions Precautions: Fall, Other (comment) Precaution Comments: BP <180/105 Restrictions Weight Bearing Restrictions: No Balance: Balance Balance Assessed: Yes Standardized Balance Assessment Standardized Balance  Assessment: Berg Balance Test Berg Balance Test Sit to Stand: Able to stand without using hands and stabilize independently Standing Unsupported: Able to stand safely 2 minutes Sitting with Back Unsupported but Feet Supported on Floor or Stool: Able to sit safely and securely 2 minutes Stand to Sit: Sits safely with minimal use of hands Transfers: Able to transfer safely, minor use of hands Standing Unsupported with Eyes Closed: Able to stand 10 seconds safely Standing Ubsupported with Feet Together: Able to place feet together independently and stand 1 minute safely From Standing, Reach Forward with Outstretched Arm: Can reach confidently >25 cm (10") From Standing Position, Pick up Object from Floor: Able to pick up shoe safely and easily From Standing Position, Turn to Look Behind Over each Shoulder: Looks behind one side only/other side shows less weight shift Turn 360 Degrees: Able to turn 360 degrees safely but slowly Standing Unsupported, Alternately Place Feet on Step/Stool: Needs assistance to keep from falling or unable to try Standing Unsupported, One Foot in Front: Able to plae foot ahead of the other independently and hold 30 seconds Standing on One Leg: Able to lift leg independently and hold equal to or more than 3 seconds Total Score: 46  Therapy/Group: Individual Therapy  [facility], [place], DPT  05/06/2023, 3:08 PM

## 2023-05-06 NOTE — Plan of Care (Signed)
  Problem: RH Awareness Goal: LTG: Patient will demonstrate awareness during functional activites type of (SLP) Description: LTG: Patient will demonstrate awareness during functional activites type of (SLP) Outcome: Not Met (baseline deficits)   Problem: RH Memory Goal: LTG Patient will demonstrate ability for day to day (SLP) Description: LTG:   Patient will demonstrate ability for day to day recall/carryover during cognitive/linguistic activities with assist  (SLP) Outcome: Completed/Met Goal: LTG Patient will use memory compensatory aids to (SLP) Description: LTG:  Patient will use memory compensatory aids to recall biographical/new, daily complex information with cues (SLP) Outcome: Completed/Met   Problem: RH Attention Goal: LTG Patient will demonstrate this level of attention during functional activites (SLP) Description: LTG:  Patient will will demonstrate this level of attention during functional activites (SLP) Outcome: Completed/Met

## 2023-05-06 NOTE — Progress Notes (Signed)
Patient ID: Lance Santiago, male   DOB: October 17, 1960, 62 y.o.   MRN: [id number]  SPC amd TTB ordered through [employer].  Patient to d/c home with HEP.

## 2023-05-06 NOTE — Progress Notes (Signed)
Physical Therapy Session Note  Patient Details  Name: Lance Santiago MRN: [id number] Date of Birth: [birth year]  Today's Date: 05/06/2023 PT Individual Time: 1309-1402 PT Individual Time Calculation (min): 53 min   Short Term Goals: Week 1:  PT Short Term Goal 1 (Week 1): Pt will be able to STS w/ CGA using LRAD PT Short Term Goal 2 (Week 1): Pt will be able to perform bed to chair w/ CGA using LRAD PT Short Term Goal 3 (Week 1): Pt will ambulate 300 ft CGA w/ LRAD PT Short Term Goal 4 (Week 1): Pt perform 12 stairs CGA w/ LRAD  Skilled Therapeutic Interventions/Progress Updates:      Therapy Documentation Precautions:  Precautions Precautions: Fall, Other (comment) Precaution Comments: BP <180/105 Restrictions Weight Bearing Restrictions: No   Pt agreeable to additional PT session with emphasis on mobility and flexibility training to better gait and transfers. Pt with right hip flexor pain, PT performed trigger point and cross friction soft tissue mobilization to right iliopsoas. Pt engaged in hip flexor stretches with half kneeling forward lunge and standing lunge to address hip and ankle mobility. Pt with improved gait and foot clearance following and presents with increased upper trap activation. Pt requires tactile cueing for shoulder depression and engaged in standing thoracic rotation (thread the needle) and swiss ball walk outs for back and chest flexibility. Pt requested HEP and PT provided following and explained exercises. Pt left seated at bedside with all needs in reach.    Access Code: [email or web address] URL: [email or web address] Date: 05/06/2023 Prepared by: Truitt Leep  Exercises - Modified Thomas Stretch  - 1 x daily - 7 x weekly - 3 sets - 10 reps - Standing Hip Flexor Stretch  - 1 x daily - 7 x weekly - 3 sets - 10 reps - Warrior I  - 1 x daily - 7 x weekly - 3 sets - 10 reps - Reverse Warrior Pose  - 1 x daily - 7 x weekly - 3 sets - 10 reps -  Plank with Thoracic Rotation on Counter  - 1 x daily - 7 x weekly - 3 sets - 10 reps - Supine Lower Trunk Rotation  - 1 x daily - 7 x weekly - 3 sets - 10 reps    Therapy/Group: Individual Therapy  Truitt Leep Truitt Leep PT, DPT  05/06/2023, 2:06 PM

## 2023-05-06 NOTE — Progress Notes (Signed)
Occupational Therapy Session Note  Patient Details  Name: Lance Santiago MRN: [id number] Date of Birth: 1961-03-24  Today's Date: 05/06/2023 OT Individual Time: 0805-0900 OT Individual Time Calculation (min): 55 min    Short Term Goals: Week 1:  OT Short Term Goal 1 (Week 1): Pt will complete toilet/shower transfers with CGA using LRAD OT Short Term Goal 2 (Week 1): Pt will complete LB dressing with CGA using modfied techniques PRN and LRAD OT Short Term Goal 3 (Week 1): Pt wil recall hemi-dressing technique with min questioning cues OT Short Term Goal 4 (Week 1): Pt complete toileting wiht CGA  Skilled Therapeutic Interventions/Progress Updates:  Skilled OT intervention completed with focus on pain management, BUE strengthening. Pt received seated in recliner, agreeable to session. 3/10 pain reported in Rt hip flexor; pt declined pain meds. OT offered rest breaks, repositioning, and heat for pain reduction.  Incorrect boot (prevalon vs PRAFO) delivered; MD/nurse notified.  BP assessed at start of session (See below). Pt reported soreness at Rt hip flexor, and agreeable to trial hot pack for muscle relaxation.   Pt completed all sit > stands and ambulatory transfers using [facility] with mod I during session.  Ambulated > gym about 100 ft. Transitioned independently on mat into supine position with wedge provided for comfort. Hot pack applied to Rt hip flexor for ~20 mins and removed without skin irritation noted. Pt reported improved pain level to 0 after use.  Pt completed the following exercises in supine during heat application, to promote BUE strength needed for postural control during functional ambulation: (4 lb dummbell) -unilateral chest press (2x12 each side) -bilateral chest press with tricep focus (3x12) (Un-weighted stretches) -alternating horizontal abduction (12 each side)   Transitioned independently back to EOM, then ambulated back to room. Mod I clearance placed on  door as pt DC at mod I level.  Vitals BP 127/97 (seated, at rest), HR 84 bpm  Pt remained seated in recliner, with all needs in reach at end of session.   Therapy Documentation Precautions:  Precautions Precautions: Fall, Other (comment) Precaution Comments: BP <180/105 Restrictions Weight Bearing Restrictions: No    Therapy/Group: Individual Therapy  Melvyn Novas, MS, OTR/L 05/06/2023, 9:25 AM

## 2023-05-07 DIAGNOSIS — E559 Vitamin D deficiency, unspecified: Secondary | ICD-10-CM | POA: Insufficient documentation

## 2023-05-07 HISTORY — DX: Vitamin D deficiency, unspecified: E55.9

## 2023-05-07 NOTE — Progress Notes (Signed)
Inpatient Rehabilitation Discharge Medication Review by a Pharmacist  A complete drug regimen review was completed for this patient to identify any potential clinically significant medication issues.  High Risk Drug Classes Is patient taking? Indication by Medication  Antipsychotic No   Anticoagulant No   Antibiotic No   Opioid No   Antiplatelet Yes Aspirin, clopidogrel (3 months - 07/25/23) then aspirin alone - CVA  Hypoglycemics/insulin No   Vasoactive Medication Yes Metoprolol, amlodipine, losartan- HTN  Chemotherapy No   Other Yes Metanx - supplement Magnesium - supplement Pantoprazole - reflux Vitamin D - bones Lidocaine - pain Rosuvastatin - HLD     Type of Medication Issue Identified Description of Issue Recommendation(s)  Drug Interaction(s) (clinically significant)     Duplicate Therapy     Allergy     No Medication Administration End Date     Incorrect Dose     Additional Drug Therapy Needed     Significant med changes from prior encounter (inform family/care partners about these prior to discharge).    Other       Clinically significant medication issues were identified that warrant physician communication and completion of prescribed/recommended actions by midnight of the next day:  No  Name of provider notified for urgent issues identified:   Provider Method of Notification:     Pharmacist comments: None  Time spent performing this drug regimen review (minutes):  20 minutes  Okey Regal, PharmD

## 2023-05-07 NOTE — Plan of Care (Signed)
Pt getting discharged, pt independent and alert and oriented x4, RN gave morning medications, assessment performed. AVS completed by 2nd RN, NA rechecked pt BP per PA request. BP improved to 137/89. Pt medications delivered to patients, pt stated his ride is waiting outside. RN notified PA pt ready for D/C

## 2023-05-17 ENCOUNTER — Other Ambulatory Visit: Payer: Self-pay | Admitting: *Deleted

## 2023-05-17 DIAGNOSIS — I739 Peripheral vascular disease, unspecified: Secondary | ICD-10-CM

## 2023-05-20 ENCOUNTER — Inpatient Hospital Stay (INDEPENDENT_AMBULATORY_CARE_PROVIDER_SITE_OTHER): Payer: Medicaid Other | Admitting: Primary Care

## 2023-05-25 ENCOUNTER — Encounter
Payer: Medicaid Other | Attending: Physical Medicine and Rehabilitation | Admitting: Physical Medicine and Rehabilitation

## 2023-05-25 DIAGNOSIS — I63522 Cerebral infarction due to unspecified occlusion or stenosis of left anterior cerebral artery: Secondary | ICD-10-CM

## 2023-05-25 NOTE — Progress Notes (Unsigned)
Subjective:    Patient ID: Lance Santiago, male    DOB: 06/21/1961, 62 y.o.   MRN: [id number]  HPI Pain Inventory Average Pain {NUMBERS; 0-10:5044} Pain Right Now {NUMBERS; 0-10:5044} My pain is {PAIN DESCRIPTION:21022940}  LOCATION OF PAIN  ***  BOWEL Number of stools per week: *** Oral laxative use {YES/NO:21197} Type of laxative *** Enema or suppository use {YES/NO:21197} History of colostomy {YES/NO:21197} Incontinent {YES/NO:21197}  BLADDER {bladder options:24190} In and out cath, frequency *** Able to self cath {YES/NO:21197} Bladder incontinence {YES/NO:21197} Frequent urination {YES/NO:21197} Leakage with coughing {YES/NO:21197} Difficulty starting stream {YES/NO:21197} Incomplete bladder emptying {YES/NO:21197}   Mobility {MOBILITY NWG:95621308}  Function {FUNCTION:21022946}  Neuro/Psych {NEURO/PSYCH:21022948}  Prior Studies {CPRM PRIOR STUDIES:21022953}  Physicians involved in your care {CPRM PHYSICIANS INVOLVED IN YOUR CARE:21022954}   Family History  Problem Relation Age of Onset   Diabetes Mother    Coronary artery disease Father    Social History   Socioeconomic History   Marital status: Divorced    Spouse name: Not on file   Number of children: Not on file   Years of education: Not on file   Highest education level: Not on file  Occupational History   Not on file  Tobacco Use   Smoking status: Every Day    Current packs/day: 1.00    Average packs/day: 1 pack/day for 40.0 years (40.0 ttl pk-yrs)    Types: Cigarettes   Smokeless tobacco: Never  Substance and Sexual Activity   Alcohol use: Not on file   Drug use: Not on file   Sexual activity: Yes  Other Topics Concern   Not on file  Social History Narrative   Not on file   Social Determinants of Health   Financial Resource Strain: Not on file  Food Insecurity: Food Insecurity Present (04/30/2023)   Hunger Vital Sign    Worried About Running Out of Food in the Last  Year: Never true    Ran Out of Food in the Last Year: Sometimes true  Transportation Needs: No Transportation Needs (04/30/2023)   PRAPARE - Administrator, Civil Service (Medical): No    Lack of Transportation (Non-Medical): No  Physical Activity: Not on file  Stress: Not on file  Social Connections: Not on file   Past Surgical History:  Procedure Laterality Date   CARDIAC CATHETERIZATION     CORONARY/GRAFT ACUTE MI REVASCULARIZATION N/A 04/25/2017   Procedure: Coronary/Graft Acute MI Revascularization;  Surgeon: Runell Gess, MD;  Location: [facility] INVASIVE CV LAB;  Service: Cardiovascular;  Laterality: N/A;   KNEE SURGERY     LEFT HEART CATH AND CORONARY ANGIOGRAPHY N/A 04/25/2017   Procedure: LEFT HEART CATH AND CORONARY ANGIOGRAPHY;  Surgeon: Runell Gess, MD;  Location: [facility] INVASIVE CV LAB;  Service: Cardiovascular;  Laterality: N/A;   LUMBAR LAMINECTOMY     Past Medical History:  Diagnosis Date   Coronary artery disease    Hyperlipidemia    Hypertension    Lumbar disc disease    Myocardial infarction (HCC)    9/18 PCI/DESx1 to RCA, EF 45%   Smoking    Systolic heart failure (HCC)    There were no vitals taken for this visit.  Opioid Risk Score:   Fall Risk Score:  `1  Depression screen PHQ 2/9      No data to display            Review of Systems     Objective:   Physical Exam  Assessment & Plan:

## 2023-05-27 ENCOUNTER — Ambulatory Visit (HOSPITAL_COMMUNITY): Payer: Medicaid Other

## 2023-06-01 ENCOUNTER — Encounter: Payer: Self-pay | Admitting: Vascular Surgery

## 2023-06-03 ENCOUNTER — Telehealth (INDEPENDENT_AMBULATORY_CARE_PROVIDER_SITE_OTHER): Payer: Self-pay | Admitting: Primary Care

## 2023-06-03 ENCOUNTER — Inpatient Hospital Stay (INDEPENDENT_AMBULATORY_CARE_PROVIDER_SITE_OTHER): Payer: Medicaid Other | Admitting: Primary Care

## 2023-06-03 NOTE — Telephone Encounter (Signed)
Patient was a no show seen at Follow up with Raulkar, Drema Pry, MD (Physical Medicine and Rehabilitation). Left VM this will be patient PCP.

## 2023-06-03 NOTE — Progress Notes (Deleted)
Patient was a no show seen at Follow up with Raulkar, Drema Pry, MD (Physical Medicine and Rehabilitation). Left VM this will be patient PCP.

## 2023-06-14 ENCOUNTER — Encounter: Payer: Self-pay | Admitting: Adult Health

## 2023-06-14 ENCOUNTER — Inpatient Hospital Stay: Payer: Medicaid Other | Admitting: Adult Health

## 2023-06-14 NOTE — Progress Notes (Deleted)
[facility] [address] [place]. [place] [address] [phone]       HOSPITAL FOLLOW UP NOTE  Mr. Santiago Lance Date of Birth:  12/01/1960 Medical Record Number:  [id number]   Reason for Referral:  hospital stroke follow up    SUBJECTIVE:   CHIEF COMPLAINT:  No chief complaint on file.   HPI:   Lance Santiago is a 62 y.o. male with PMH significant for CAD, HTN, HLD, smoker 1ppd for about 47 years who presented to ED on 04/26/2023 with acute onset RLE weakness with right leg cold to touch, pale unable to palpate any pulses.  Stroke workup revealed left ACA infarct s/p TNK likely secondary to large vessel disease from tandem stenosis of 50% left ICA bulb and siphon in setting of severe, untreated arthrosclerotic disease, uncontrolled risk factors and chronic tobacco use.  Recommended DAPT for 3 months then aspirin alone.  LDL 166, started Crestor 40 mg daily.  A1c 6.0.  HTN on high-end, no prior medications,, discharged on amlodipine, losartan and Lopressor and advised outpatient PCP follow-up.  Noted asymptomatic right ICA stenosis, 70% per CTA, 40 to 59% per carotid ultrasound.  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  Counseled on complete tobacco cessation.  Discharge to CIR for ongoing therapy needs.       PERTINENT IMAGING  Code Stroke CT head: chronic left frontoparietal, R cerebellar and R inferior temporal lobe infarcts. ASPECTS 10.    CTA head & neck: No LVO. Severe stenosis: distal R P3 and L P1. 70% R proximal ICA stenosis. 50% L proximal ICA stenosis and moderate to severe left ICA siphon stenosis.    MRI: Acute developing left ACA infarct 2D Echo EF 50-55% CUS R ICA 40-59%  stenosis LDL 166 TG 573->281 HgbA1c 6.0    ROS:   14 system review of systems performed and negative with exception of ***  PMH:  Past Medical History:  Diagnosis Date   Coronary artery disease    Hyperlipidemia    Hypertension    Lumbar disc disease    Myocardial infarction (HCC)    9/18 PCI/DESx1 to RCA, EF 45%   Smoking    Systolic heart failure (HCC)     PSH:  Past Surgical History:  Procedure Laterality Date   CARDIAC CATHETERIZATION     CORONARY/GRAFT ACUTE MI REVASCULARIZATION N/A 04/25/2017   Procedure: Coronary/Graft Acute MI Revascularization;  Surgeon: Runell Gess, MD;  Location: [facility] INVASIVE CV LAB;  Service: Cardiovascular;  Laterality: N/A;   KNEE SURGERY     LEFT HEART CATH AND CORONARY ANGIOGRAPHY N/A 04/25/2017   Procedure: LEFT HEART CATH AND CORONARY ANGIOGRAPHY;  Surgeon: Runell Gess, MD;  Location: [facility] INVASIVE CV LAB;  Service: Cardiovascular;  Laterality: N/A;   LUMBAR LAMINECTOMY      Social History:  Social History   Socioeconomic History   Marital status: Divorced    Spouse name: Not on file   Number of children: Not on file   Years of education: Not on file   Highest education level: Not on file  Occupational History   Not on file  Tobacco Use   Smoking status: Every Day    Current packs/day: 1.00    Average packs/day: 1 pack/day for 40.0 years (40.0 ttl  pk-yrs)    Types: Cigarettes   Smokeless tobacco: Never  Substance and Sexual Activity   Alcohol use: Not on file   Drug use: Not on file   Sexual activity: Yes  Other Topics Concern   Not on file  Social History Narrative   Not on file   Social Determinants of Health   Financial Resource Strain: Not on file  Food Insecurity: Food Insecurity Present (04/30/2023)   Hunger Vital Sign    Worried About Running Out of Food in the Last Year: Never true    Ran Out of Food in the Last Year: Sometimes true  Transportation Needs: No Transportation Needs (04/30/2023)   PRAPARE  - Administrator, Civil Service (Medical): No    Lack of Transportation (Non-Medical): No  Physical Activity: Not on file  Stress: Not on file  Social Connections: Not on file  Intimate Partner Violence: Not At Risk (04/30/2023)   Humiliation, Afraid, Rape, and Kick questionnaire    Fear of Current or Ex-Partner: No    Emotionally Abused: No    Physically Abused: No    Sexually Abused: No    Family History:  Family History  Problem Relation Age of Onset   Diabetes Mother    Coronary artery disease Father     Medications:   Current Outpatient Medications on File Prior to Visit  Medication Sig Dispense Refill   acetaminophen (TYLENOL) 325 MG tablet Take 1-2 tablets (325-650 mg total) by mouth every 4 (four) hours as needed for mild pain.     amLODipine (NORVASC) 10 MG tablet Take 1 tablet (10 mg total) by mouth daily. 30 tablet 0   aspirin EC 81 MG tablet Take 1 tablet (81 mg total) by mouth daily. Swallow whole. 120 tablet 0   clopidogrel (PLAVIX) 75 MG tablet Take 1 tablet (75 mg total) by mouth daily. 30 tablet 0   l-methylfolate-B6-B12 (METANX) 3-35-2 MG TABS tablet Take 1 tablet by mouth daily. 60 tablet 0   lidocaine (LIDODERM) 5 % Place 1 patch onto the skin daily. Remove & Discard patch within 12 hours or as directed by MD 30 patch 0   losartan (COZAAR) 100 MG tablet Take 1 tablet (100 mg total) by mouth daily. 30 tablet 0   magnesium oxide (MAG-OX) 400 MG tablet Take 1 tablet (400 mg total) by mouth at bedtime. 120 tablet 0   metoprolol tartrate (LOPRESSOR) 50 MG tablet Take 1 tablet (50 mg total) by mouth 2 (two) times daily. 60 tablet 0   nicotine (NICODERM CQ - DOSED IN MG/24 HOURS) 21 mg/24hr patch Place 1 patch (21 mg total) onto the skin daily as needed (reduce craving for cigarettes). 28 patch 0   pantoprazole (PROTONIX) 40 MG tablet Take 1 tablet (40 mg total) by mouth at bedtime. 30 tablet 0   rosuvastatin (CRESTOR) 40 MG tablet Take 1 tablet (40 mg total)  by mouth daily. 30 tablet 0   Vitamin D, Ergocalciferol, (DRISDOL) 1.25 MG (50000 UNIT) CAPS capsule Take 1 capsule (50,000 Units total) by mouth every 7 (seven) days. 5 capsule 0   No current facility-administered medications on file prior to visit.    Allergies:   Allergies  Allergen Reactions   Penicillins Other (See Comments)    Causes sore throat Has patient had a PCN reaction causing immediate rash, facial/tongue/throat swelling, SOB or lightheadedness with hypotension: No Has patient had a PCN reaction causing severe rash involving mucus membranes or skin necrosis: No Has  patient had a PCN reaction that required hospitalization: No Has patient had a PCN reaction occurring within the last 10 years: No If all of the above answers are "NO", then may proceed with Cephalosporin use.      OBJECTIVE:  Physical Exam  There were no vitals filed for this visit. There is no height or weight on file to calculate BMI. No results found.      No data to display           General: well developed, well nourished, seated, in no evident distress Head: head normocephalic and atraumatic.   Neck: supple with no carotid or supraclavicular bruits Cardiovascular: regular rate and rhythm, no murmurs Musculoskeletal: no deformity Skin:  no rash/petichiae Vascular:  Normal pulses all extremities   Neurologic Exam Mental Status: Awake and fully alert. Oriented to place and time. Recent and remote memory intact. Attention span, concentration and fund of knowledge appropriate. Mood and affect appropriate.  Cranial Nerves: Fundoscopic exam reveals sharp disc margins. Pupils equal, briskly reactive to light. Extraocular movements full without nystagmus. Visual fields full to confrontation. Hearing intact. Facial sensation intact. Face, tongue, palate moves normally and symmetrically.  Motor: Normal bulk and tone. Normal strength in all tested extremity muscles Sensory.: intact to touch ,  pinprick , position and vibratory sensation.  Coordination: Rapid alternating movements normal in all extremities. Finger-to-nose and heel-to-shin performed accurately bilaterally. Gait and Station: Arises from chair without difficulty. Stance is normal. Gait demonstrates normal stride length and balance with ***. Tandem walk and heel toe ***.  Reflexes: 1+ and symmetric. Toes downgoing.     NIHSS  *** Modified Rankin  ***      ASSESSMENT: Lance Santiago is a 62 y.o. 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 Vascular risk factors include HLD, HTN, pre-DM severe PAD, tobacco use, THC use, and bilateral carotid stenosis.      PLAN:  Left ACA stroke:  Residual deficit: ***.  Continue aspirin 81mg  daily and Plavix for additional 2 months then aspirin alone and continue rosuvastatin (Crestor) 40 mg for secondary stroke prevention.   Discussed secondary stroke prevention measures and importance of close PCP follow up for aggressive stroke risk factor management including BP goal<130/90, HLD with LDL goal<70 and DM with A1c.<7 .  Stroke labs 04/2023: LDL 166, A1c 6.0 I have gone over the pathophysiology of stroke, warning signs and symptoms, risk factors and their management in some detail with instructions to go to the closest emergency room for symptoms of concern. Carotid stenosis: PAD:  No showed prior scheduled visit with [facility] - advised to reschedule  Tobacco use:    Follow up in *** or call earlier if needed   CC:  [facility] provider: Dr. Pearlean Brownie PCP: Patient, No Pcp Per    I spent *** minutes of face-to-face and non-face-to-face time with patient.  This included previsit chart review including review of recent hospitalization, lab review, study review, order entry, electronic  health record documentation, patient education regarding recent stroke including etiology, secondary stroke prevention measures and importance of managing stroke risk factors, residual deficits and typical recovery time and answered all other questions to patient satisfaction   Ihor Austin, [facility]  [facility] [address] [place], [place] [address]  Phone [phone] Fax [phone] Note: This document  was prepared with digital dictation and possible smart phrase technology. Any transcriptional errors that result from this process are unintentional.

## 2023-06-16 ENCOUNTER — Encounter: Payer: Self-pay | Admitting: Student

## 2023-06-16 ENCOUNTER — Ambulatory Visit: Payer: Medicaid Other | Admitting: Student

## 2023-06-16 VITALS — BP 156/88 | HR 88 | Temp 98.4°F | Resp 18 | Ht 67.0 in | Wt 154.1 lb

## 2023-06-16 DIAGNOSIS — I442 Atrioventricular block, complete: Secondary | ICD-10-CM

## 2023-06-16 DIAGNOSIS — I10 Essential (primary) hypertension: Secondary | ICD-10-CM

## 2023-06-16 DIAGNOSIS — I739 Peripheral vascular disease, unspecified: Secondary | ICD-10-CM | POA: Diagnosis not present

## 2023-06-16 DIAGNOSIS — K219 Gastro-esophageal reflux disease without esophagitis: Secondary | ICD-10-CM | POA: Insufficient documentation

## 2023-06-16 DIAGNOSIS — E871 Hypo-osmolality and hyponatremia: Secondary | ICD-10-CM | POA: Insufficient documentation

## 2023-06-16 DIAGNOSIS — E559 Vitamin D deficiency, unspecified: Secondary | ICD-10-CM | POA: Diagnosis not present

## 2023-06-16 DIAGNOSIS — E785 Hyperlipidemia, unspecified: Secondary | ICD-10-CM

## 2023-06-16 DIAGNOSIS — I63522 Cerebral infarction due to unspecified occlusion or stenosis of left anterior cerebral artery: Secondary | ICD-10-CM

## 2023-06-16 DIAGNOSIS — Z720 Tobacco use: Secondary | ICD-10-CM

## 2023-06-16 DIAGNOSIS — Z6824 Body mass index (BMI) 24.0-24.9, adult: Secondary | ICD-10-CM

## 2023-06-16 HISTORY — DX: Hypo-osmolality and hyponatremia: E87.1

## 2023-06-16 HISTORY — DX: Gastro-esophageal reflux disease without esophagitis: K21.9

## 2023-06-16 MED ORDER — ROSUVASTATIN CALCIUM 40 MG PO TABS: 40.0000 mg | ORAL_TABLET | Freq: Every day | ORAL | 2 refills | Status: DC

## 2023-06-16 MED ORDER — LOSARTAN POTASSIUM 100 MG PO TABS: 100.0000 mg | ORAL_TABLET | Freq: Every day | ORAL | 1 refills | Status: DC

## 2023-06-16 MED ORDER — CLOPIDOGREL BISULFATE 75 MG PO TABS: 75.0000 mg | ORAL_TABLET | Freq: Every day | ORAL | 3 refills | Status: DC

## 2023-06-16 MED ORDER — PANTOPRAZOLE SODIUM 40 MG PO TBEC: 40.0000 mg | DELAYED_RELEASE_TABLET | Freq: Every day | ORAL | 1 refills | Status: DC

## 2023-06-16 MED ORDER — NICOTINE 21 MG/24HR TD PT24: 21.0000 mg | MEDICATED_PATCH | Freq: Every day | TRANSDERMAL | 0 refills | Status: DC | PRN

## 2023-06-16 MED ORDER — METOPROLOL TARTRATE 50 MG PO TABS: 50.0000 mg | ORAL_TABLET | Freq: Two times a day (BID) | ORAL | 2 refills | Status: DC

## 2023-06-16 MED ORDER — VITAMIN D (ERGOCALCIFEROL) 1.25 MG (50000 UNIT) PO CAPS: 50000.0000 [IU] | ORAL_CAPSULE | ORAL | 1 refills | Status: DC

## 2023-06-16 MED ORDER — AMLODIPINE BESYLATE 10 MG PO TABS: 10.0000 mg | ORAL_TABLET | Freq: Every day | ORAL | 1 refills | Status: AC

## 2023-06-16 NOTE — Assessment & Plan Note (Signed)
Am rechecking a CMP for evaluation of hyponatremia.

## 2023-06-16 NOTE — Assessment & Plan Note (Signed)
He is using rosuvastatin 40 mg and denies complaints of myalgia, dark urine, or abdominal pain.  Refill has been sent.

## 2023-06-16 NOTE — Assessment & Plan Note (Signed)
His vitamin D level was very low during admission and he is taking vitamin D 1.25 mg caps once every 7 days.  A refill has been sent we will reevaluate at annual exam.

## 2023-06-16 NOTE — Assessment & Plan Note (Signed)
Symptoms are stable on pantoprazole 40 mg.

## 2023-06-16 NOTE — Assessment & Plan Note (Signed)
He says he is ready to quite. Refilled for nicotine patches.

## 2023-06-16 NOTE — Assessment & Plan Note (Addendum)
Discharged on aspirin/plavix for 3 months, after which to continue aspirin monotherapy indefinitely.  CBC collected today.  I will place a referral to neurology in [place], the patient has transportation limitations.

## 2023-06-16 NOTE — Progress Notes (Signed)
New Patient Office Visit  Subjective    Patient ID: Lance Santiago, male    DOB: 09/08/1960  Age: 62 y.o. MRN: [id number]  CC:  Chief Complaint  Patient presents with   Establish Care    Looking to establish care now that he has insurance.    HPI Lance Santiago presents to establish care. He is divorced with 2 grown children and worked in [employer] for 35 years. He was admitted for a stroke on 04/26/23.  Chart Review: Lance Santiago is a 62 y.o. male with history of CAD, HTN, systolic CHF, tobacco use who was admitted on 04/26/23 with right sided weakness and coolness RLE.  UDS positive for THC.  CTA head was negative for LVO and showed severe stenosis distal right-P3 segment and left P1 segment, 70% stenosis proximal right ICA and 50% stenosis proximal left ICA.  MRI brain done revealing L-ACA territory infarct.  Dr. Roda Shutters felt the stroke was due to small vessel disease and recommended DAPT x 3 months followed by aspirin alone.  CT abdomen pelvis done showing occlusion of left common iliac artery at origin with reconstitution of external iliac artery, severe narrowing right common iliac and superficial femoral arteries, 12 mm partially thrombosed right-SFA, extensive irregular plaque throughout infrarenal abdominal aorta causing moderate narrowing at aortic bifurcation as well as incidental finding of acute uncomplicated sigmoid diverticulitis and hepatic steatosis.  Dr. Randie Heinz was consulted for input and recommended outpatient follow-up on PAD as patient was noted to have palpable right posterior tibial pulse and was able to ambulate 2 blocks prior to symptoms of claudication.  Carotid Dopplers done showing 40 to 59% right ICA stenosis. Follow up CBC showed rise in H/H question due to hemoconcentration. Serial check of lytes showed rise in BUN and recurrent hyponatremia. He was advised to increase fluid intake. [facility] with [facility] follow up and  recommend repeat CBC/BMP in 1-2 weeks post discharge.   Discharged on aspirin/plavix for 3 months, after which to continue aspirin monotherapy indefinitely.  On 10/15 the patient had a tele-health visit with Dr. Carlis Abbott with [facility]. Plan discussed included CVA improvements and to avoid mariajuana.  He was supposed to follow up with neurology on 11/4 but was unable to go due to transportation to [place].  He is requesting that all of his consultations be moved to the [place].   Today he reports that he feels good overall except for left leg swelling without pain. He is checking his blood pressures at home, and reports stable readings until he was out of his medications. He brings all medication bottles labeled along with discharge paperwork. He denies residual weakness, chest pain, shortness or breath, or palpitations.  He does report right lower extremity swelling that is intermittent and without pain.   Outpatient Encounter Medications as of 06/16/2023  Medication Sig   acetaminophen (TYLENOL) 325 MG tablet Take 1-2 tablets (325-650 mg total) by mouth every 4 (four) hours as needed for mild pain.   amLODipine (NORVASC) 10 MG tablet Take 1 tablet (10 mg total) by mouth daily.   aspirin EC 81 MG tablet Take 1 tablet (81 mg total) by mouth daily. Swallow whole.   clopidogrel (PLAVIX) 75 MG tablet Take 1 tablet (75 mg total) by mouth daily.   l-methylfolate-B6-B12 (METANX) 3-35-2 MG TABS tablet Take 1 tablet by mouth daily.   lidocaine (LIDODERM) 5 % Place 1 patch onto the skin  daily. Remove & Discard patch within 12 hours or as directed by MD   losartan (COZAAR) 100 MG tablet Take 1 tablet (100 mg total) by mouth daily.   magnesium oxide (MAG-OX) 400 MG tablet Take 1 tablet (400 mg total) by mouth at bedtime.   metoprolol tartrate (LOPRESSOR) 50 MG tablet Take 1 tablet (50 mg total) by mouth 2 (two) times daily.   nicotine (NICODERM CQ - DOSED IN MG/24 HOURS) 21  mg/24hr patch Place 1 patch (21 mg total) onto the skin daily as needed (reduce craving for cigarettes).   pantoprazole (PROTONIX) 40 MG tablet Take 1 tablet (40 mg total) by mouth at bedtime.   rosuvastatin (CRESTOR) 40 MG tablet Take 1 tablet (40 mg total) by mouth daily.   Vitamin D, Ergocalciferol, (DRISDOL) 1.25 MG (50000 UNIT) CAPS capsule Take 1 capsule (50,000 Units total) by mouth every 7 (seven) days.   [DISCONTINUED] amLODipine (NORVASC) 10 MG tablet Take 1 tablet (10 mg total) by mouth daily.   [DISCONTINUED] clopidogrel (PLAVIX) 75 MG tablet Take 1 tablet (75 mg total) by mouth daily.   [DISCONTINUED] losartan (COZAAR) 100 MG tablet Take 1 tablet (100 mg total) by mouth daily.   [DISCONTINUED] metoprolol tartrate (LOPRESSOR) 50 MG tablet Take 1 tablet (50 mg total) by mouth 2 (two) times daily.   [DISCONTINUED] nicotine (NICODERM CQ - DOSED IN MG/24 HOURS) 21 mg/24hr patch Place 1 patch (21 mg total) onto the skin daily as needed (reduce craving for cigarettes).   [DISCONTINUED] pantoprazole (PROTONIX) 40 MG tablet Take 1 tablet (40 mg total) by mouth at bedtime.   [DISCONTINUED] rosuvastatin (CRESTOR) 40 MG tablet Take 1 tablet (40 mg total) by mouth daily.   [DISCONTINUED] Vitamin D, Ergocalciferol, (DRISDOL) 1.25 MG (50000 UNIT) CAPS capsule Take 1 capsule (50,000 Units total) by mouth every 7 (seven) days.   No facility-administered encounter medications on file as of 06/16/2023.    Past Medical History:  Diagnosis Date   Coronary artery disease    Hyperlipidemia    Hypertension    Lumbar disc disease    Myocardial infarction (HCC)    9/18 PCI/DESx1 to RCA, EF 45%   Smoking    Systolic heart failure [facility])     Past Surgical History:  Procedure Laterality Date   CARDIAC CATHETERIZATION     CORONARY/GRAFT ACUTE MI REVASCULARIZATION N/A 04/25/2017   Procedure: Coronary/Graft Acute MI Revascularization;  Surgeon: Runell Gess, MD;  Location: [facility] INVASIVE CV LAB;   Service: Cardiovascular;  Laterality: N/A;   KNEE SURGERY     LEFT HEART CATH AND CORONARY ANGIOGRAPHY N/A 04/25/2017   Procedure: LEFT HEART CATH AND CORONARY ANGIOGRAPHY;  Surgeon: Runell Gess, MD;  Location: [facility] INVASIVE CV LAB;  Service: Cardiovascular;  Laterality: N/A;   LUMBAR LAMINECTOMY      Family History  Problem Relation Age of Onset   Diabetes Mother    Coronary artery disease Father     Social History   Socioeconomic History   Marital status: Divorced    Spouse name: Not on file   Number of children: Not on file   Years of education: Not on file   Highest education level: Not on file  Occupational History   Not on file  Tobacco Use   Smoking status: Every Day    Current packs/day: 1.00    Average packs/day: 1 pack/day for 40.0 years (40.0 ttl pk-yrs)    Types: Cigarettes   Smokeless tobacco: Never  Substance and Sexual Activity  Alcohol use: Not Currently   Drug use: Yes    Types: Marijuana    Comment: once in a while to help him sleep   Sexual activity: Yes  Other Topics Concern   Not on file  Social History Narrative   Not on file   Social Determinants of Health   Financial Resource Strain: Not on file  Food Insecurity: Food Insecurity Present (04/30/2023)   Hunger Vital Sign    Worried About Running Out of Food in the Last Year: Never true    Ran Out of Food in the Last Year: Sometimes true  Transportation Needs: No Transportation Needs (04/30/2023)   PRAPARE - Administrator, Civil Service (Medical): No    Lack of Transportation (Non-Medical): No  Physical Activity: Not on file  Stress: Not on file  Social Connections: Not on file  Intimate Partner Violence: Not At Risk (04/30/2023)   Humiliation, Afraid, Rape, and Kick questionnaire    Fear of Current or Ex-Partner: No    Emotionally Abused: No    Physically Abused: No    Sexually Abused: No    Review of Systems  Constitutional: Negative.   HENT: Negative.    Eyes:  Negative.   Respiratory: Negative.    Cardiovascular:  Positive for leg swelling. Negative for chest pain.  Gastrointestinal: Negative.   Genitourinary: Negative.   Musculoskeletal: Negative.   Skin: Negative.   Neurological: Negative.   Endo/Heme/Allergies: Negative.   Psychiatric/Behavioral: Negative.          Objective    BP (!) 156/88 (BP Location: Left Arm, Patient Position: Sitting)   Pulse 88   Temp 98.4 F (36.9 C)   Resp 18   Ht 5\' 7"  (1.702 m)   Wt 154 lb 2 oz (69.9 kg)   SpO2 98%   BMI 24.14 kg/m   Physical Exam Vitals reviewed.  Constitutional:      Appearance: Normal appearance. He is normal weight.  HENT:     Head: Normocephalic.     Nose: Nose normal.     Mouth/Throat:     Mouth: Mucous membranes are moist.     Pharynx: Oropharynx is clear.  Cardiovascular:     Rate and Rhythm: Normal rate and regular rhythm.     Pulses: Normal pulses.     Heart sounds: Normal heart sounds.  Pulmonary:     Effort: Pulmonary effort is normal. No respiratory distress.     Breath sounds: Examination of the right-lower field reveals decreased breath sounds. Examination of the left-lower field reveals decreased breath sounds. Decreased breath sounds present. No wheezing, rhonchi or rales.  Abdominal:     General: Bowel sounds are normal.     Palpations: Abdomen is soft.  Musculoskeletal:        General: No swelling or tenderness.     Right lower leg: Edema present.     Left lower leg: No edema.  Skin:    General: Skin is warm and dry.     Capillary Refill: Capillary refill takes less than 2 seconds.  Neurological:     Mental Status: He is alert and oriented to person, place, and time.     Cranial Nerves: No cranial nerve deficit.     Sensory: No sensory deficit.     Motor: No weakness.     Coordination: Coordination normal.     Gait: Gait normal.     Deep Tendon Reflexes: Reflexes normal.  Psychiatric:  Mood and Affect: Mood normal.        Behavior:  Behavior normal.         Assessment & Plan:   Problem List Items Addressed This Visit     Tobacco abuse    He says he is ready to quite. Refilled for nicotine patches.       Relevant Medications   nicotine (NICODERM CQ - DOSED IN MG/24 HOURS) 21 mg/24hr patch   Hypertension    Refills for all antihypertensives: amlodipine, metoprolol tartrate and losartan.  Patient continue to monitor his blood pressures daily.      Relevant Medications   metoprolol tartrate (LOPRESSOR) 50 MG tablet   amLODipine (NORVASC) 10 MG tablet   losartan (COZAAR) 100 MG tablet   rosuvastatin (CRESTOR) 40 MG tablet   Dyslipidemia    He is using rosuvastatin 40 mg and denies complaints of myalgia, dark urine, or abdominal pain.  Refill has been sent.      Relevant Medications   rosuvastatin (CRESTOR) 40 MG tablet   Arterial ischemic stroke, ACA (anterior cerebral artery), left, acute (HCC) - Primary    Discharged on aspirin/plavix for 3 months, after which to continue aspirin monotherapy indefinitely.  CBC collected today.  I will place a referral to neurology in [place], the patient has transportation limitations.        Relevant Medications   metoprolol tartrate (LOPRESSOR) 50 MG tablet   amLODipine (NORVASC) 10 MG tablet   losartan (COZAAR) 100 MG tablet   clopidogrel (PLAVIX) 75 MG tablet   rosuvastatin (CRESTOR) 40 MG tablet   Other Relevant Orders   CBC with Diff   Ambulatory referral to Neurology   Vitamin D deficiency    His vitamin D level was very low during admission and he is taking vitamin D 1.25 mg caps once every 7 days.  A refill has been sent we will reevaluate at annual exam.      Relevant Medications   Vitamin D, Ergocalciferol, (DRISDOL) 1.25 MG (50000 UNIT) CAPS capsule   Gastroesophageal reflux disease    Symptoms are stable on pantoprazole 40 mg.      Relevant Medications   pantoprazole (PROTONIX) 40 MG tablet   Hyponatremia    Am rechecking a CMP for  evaluation of hyponatremia.      Relevant Orders   CMP14 + Anion Gap   PVD (peripheral vascular disease) with claudication (HCC)   Relevant Medications   metoprolol tartrate (LOPRESSOR) 50 MG tablet   amLODipine (NORVASC) 10 MG tablet   losartan (COZAAR) 100 MG tablet   rosuvastatin (CRESTOR) 40 MG tablet   Other Relevant Orders   Ambulatory referral to Vascular Surgery   Complete heart block (HCC)   Relevant Medications   metoprolol tartrate (LOPRESSOR) 50 MG tablet   amLODipine (NORVASC) 10 MG tablet   losartan (COZAAR) 100 MG tablet   rosuvastatin (CRESTOR) 40 MG tablet   Other Relevant Orders   Ambulatory referral to Cardiology    Return in about 2 months (around 08/16/2023) for annual phys.   Edwena Blow, NP

## 2023-06-16 NOTE — Assessment & Plan Note (Signed)
Refills for all antihypertensives: amlodipine, metoprolol tartrate and losartan.  Patient continue to monitor his blood pressures daily.

## 2023-06-17 LAB — CBC WITH DIFFERENTIAL/PLATELET
Basophils Absolute: 0.1 10*3/uL (ref 0.0–0.2)
Basos: 1 %
EOS (ABSOLUTE): 0.2 10*3/uL (ref 0.0–0.4)
Eos: 4 %
Hematocrit: 46.9 % (ref 37.5–51.0)
Hemoglobin: 15.7 g/dL (ref 13.0–17.7)
Immature Grans (Abs): 0 10*3/uL (ref 0.0–0.1)
Immature Granulocytes: 0 %
Lymphocytes Absolute: 2 10*3/uL (ref 0.7–3.1)
Lymphs: 29 %
MCH: 30.6 pg (ref 26.6–33.0)
MCHC: 33.5 g/dL (ref 31.5–35.7)
MCV: 91 fL (ref 79–97)
Monocytes Absolute: 0.7 10*3/uL (ref 0.1–0.9)
Monocytes: 10 %
Neutrophils Absolute: 3.8 10*3/uL (ref 1.4–7.0)
Neutrophils: 56 %
Platelets: 210 10*3/uL (ref 150–450)
RBC: 5.13 x10E6/uL (ref 4.14–5.80)
RDW: 14.2 % (ref 11.6–15.4)
WBC: 6.9 10*3/uL (ref 3.4–10.8)

## 2023-06-17 LAB — CMP14 + ANION GAP
ALT: 34 [IU]/L (ref 0–44)
AST: 41 [IU]/L — ABNORMAL HIGH (ref 0–40)
Albumin: 4.2 g/dL (ref 3.8–4.9)
Alkaline Phosphatase: 92 [IU]/L (ref 44–121)
Anion Gap: 13 mmol/L (ref 10.0–18.0)
BUN/Creatinine Ratio: 20 (ref 9–20)
BUN: 23 mg/dL (ref 6–24)
Bilirubin Total: 0.2 mg/dL (ref 0.0–1.2)
CO2: 25 mmol/L (ref 20–29)
Calcium: 9.7 mg/dL (ref 8.7–10.2)
Chloride: 102 mmol/L (ref 96–106)
Creatinine, Ser: 1.17 mg/dL (ref 0.76–1.27)
Globulin, Total: 2.4 g/dL (ref 1.5–4.5)
Glucose: 92 mg/dL (ref 70–99)
Potassium: 5.3 mmol/L — ABNORMAL HIGH (ref 3.5–5.2)
Sodium: 140 mmol/L (ref 134–144)
Total Protein: 6.6 g/dL (ref 6.0–8.5)
eGFR: 72 mL/min/{1.73_m2} (ref >59)

## 2023-06-18 NOTE — Progress Notes (Signed)
Leak, Luetta Nutting, NP  Christianne Dolin, CMA I have reviewed your lab results.  Your kidney function is normal. Stay hydrated, your potassium was slightly elevated. Blood counts are normal.  Pt. Informed of results and recommendations.

## 2023-06-18 NOTE — Progress Notes (Signed)
I have reviewed your lab results.  Your kidney function is normal. Stay hydrated, your potassium was slightly elevated. Blood counts are normal.

## 2023-06-22 ENCOUNTER — Encounter: Payer: Self-pay | Admitting: *Deleted

## 2023-06-23 ENCOUNTER — Ambulatory Visit: Payer: Medicaid Other

## 2023-06-23 VITALS — BP 159/97 | HR 76 | Ht 67.0 in | Wt 156.0 lb

## 2023-06-23 DIAGNOSIS — Z720 Tobacco use: Secondary | ICD-10-CM

## 2023-06-23 DIAGNOSIS — I2510 Atherosclerotic heart disease of native coronary artery without angina pectoris: Secondary | ICD-10-CM

## 2023-06-23 DIAGNOSIS — I739 Peripheral vascular disease, unspecified: Secondary | ICD-10-CM | POA: Diagnosis not present

## 2023-06-23 DIAGNOSIS — E785 Hyperlipidemia, unspecified: Secondary | ICD-10-CM

## 2023-06-23 DIAGNOSIS — I10 Essential (primary) hypertension: Secondary | ICD-10-CM

## 2023-06-23 DIAGNOSIS — Z9861 Coronary angioplasty status: Secondary | ICD-10-CM

## 2023-06-23 NOTE — Patient Instructions (Signed)
Medication Instructions:  Your physician recommends that you continue on your current medications as directed. Please refer to the Current Medication list given to you today.  *If you need a refill on your cardiac medications before your next appointment, please call your pharmacy*   Lab Work: NONE If you have labs (blood work) drawn today and your tests are completely normal, you will receive your results only by: MyChart Message (if you have MyChart) OR A paper copy in the mail If you have any lab test that is abnormal or we need to change your treatment, we will call you to review the results.   Testing/Procedures: NONE   Follow-Up: At [facility], you and your health needs are our priority.  As part of our continuing mission to provide you with exceptional heart care, we have created designated Provider Care Teams.  These Care Teams include your primary Cardiologist (physician) and Advanced Practice Providers (APPs -  Physician Assistants and Nurse Practitioners) who all work together to provide you with the care you need, when you need it.  We recommend signing up for the patient portal called "MyChart".  Sign up information is provided on this After Visit Summary.  MyChart is used to connect with patients for Virtual Visits (Telemedicine).  Patients are able to view lab/test results, encounter notes, upcoming appointments, etc.  Non-urgent messages can be sent to your provider as well.   To learn more about what you can do with MyChart, go to [email or web address].    Your next appointment:   3 month(s)  Provider:   Huntley Dec, MD    Other Instructions

## 2023-06-23 NOTE — Assessment & Plan Note (Signed)
He is having a hard time refilling his nicotine patches. He is going to review with PCP if the prescription needs to be sent to an alternate pharmacy. He is motivated to quit.  Currently has a nicotine patch on.

## 2023-06-23 NOTE — Assessment & Plan Note (Signed)
He does have significant bilateral peripheral vascular disease. Pending vascular surgery evaluation, limited due to barriers to transportation. He mentions now he has [facility] and I offered him opportunities with social care workers to help with transportation for visits.  He mentions he will work on this and have this set up. Currently no acute symptoms continues to be on antiplatelet and lipid-lowering therapy.

## 2023-06-23 NOTE — Assessment & Plan Note (Signed)
Markedly elevated LDL and triglyceride levels on recent inpatient stay. Continue with rosuvastatin 40 mg once daily. Will recommend rechecking lipid panel prior to his next follow-up visit in 3 months with [place]. He has pending follow-up with PCP and blood work tentatively in January.  Will review those results once available. If LDL not at goal below 70 mg/dL, we will further escalate lipid-lowering therapy with addition of Zetia with or without PCSK9 inhibitors.

## 2023-06-23 NOTE — Progress Notes (Signed)
Cardiology Consultation:    Date:  06/23/2023   ID:  Lance Santiago, DOB 01-10-1961, MRN [id number]  PCP:  Lance Blow, NP  Cardiologist:  Lance Murphy, MD   Referring MD: Lance Blow, NP   No chief complaint on file.    ASSESSMENT AND PLAN:   [name] 62 year old with history of coronary artery disease s/p inferior STEMI in 2018 PCI of RCA, mild cardiomyopathy and recovered LVEF, recent acute stroke left anterior cerebral artery circulation s/p TNK with good neurological recovery without any significant residual defects and stroke felt secondary to stenosis of left internal carotid artery associated with atherosclerosis, carotid artery disease, peripheral vascular disease involving femoral arteries bilaterally pending follow-up visit with vascular surgeon, dyslipidemia, hypertension/hypertriglyceridemia, prediabetes, tobacco use, chronic back pain, marijuana use, poor dietary adherence here to establish care.  Problem List Items Addressed This Visit     Tobacco abuse    He is having a hard time refilling his nicotine patches. He is going to review with PCP if the prescription needs to be sent to an alternate pharmacy. He is motivated to quit.  Currently has a nicotine patch on.       Hypertension    Suboptimal today.   Advised him to keep all of his blood pressure readings over the next 7 to 10 days and give our office a call. If the blood pressure readings at home are consistently above 130 over 80 mmHg, we will have to titrate up his medications. Continue his current dose of amlodipine 10 mg once daily Metoprolol tartrate 50 mg twice daily Losartan 100 mg once daily  Advised him to consume plenty of fluids through the day to help with diuresis given there is mild upper limits of normal potassium levels detected on recent blood work from 06/16/2023.       Dyslipidemia    Markedly elevated LDL and triglyceride levels on recent inpatient stay. Continue  with rosuvastatin 40 mg once daily. Will recommend rechecking lipid panel prior to his next follow-up visit in 3 months with [place]. He has pending follow-up with PCP and blood work tentatively in January.  Will review those results once available. If LDL not at goal below 70 mg/dL, we will further escalate lipid-lowering therapy with addition of Zetia with or without PCSK9 inhibitors.       CAD S/P percutaneous coronary angioplasty; inferior STEMI September 2018, s/p PCI of RCA.    Remains asymptomatic at this time. Reviewed his overall risk for cardiac disease. Continue with aspirin 81 mg indefinitely. Continue with rosuvastatin 40 mg once daily.  He is currently on dual antiplatelet therapy with Plavix 75 mg once daily for 3 months since his recent stroke, tentatively to continue until December 20.      PVD (peripheral vascular disease) with claudication (HCC) - Primary    He does have significant bilateral peripheral vascular disease. Pending vascular surgery evaluation, limited due to barriers to transportation. He mentions now he has [facility] and I offered him opportunities with social care workers to help with transportation for visits.  He mentions he will work on this and have this set up. Currently no acute symptoms continues to be on antiplatelet and lipid-lowering therapy.        Return to clinic in 3 months.  History of Present Illness:    Lance Santiago is a 62 y.o. male who is being seen today for the evaluation of coronary artery disease and here to establish care.  At the request of Leak, Lance L, NP.  Here for the visit by himself.  Lives with his son and his girlfriend at home.  Mentions has significant difficulty making 2 appointments in [place] due to transportation, with only 1 car for the household.  Recently he was admitted between 04/26/2023 through 04/30/2023 at the hospital for acute right lower extremity weakness acute left anterior cerebral artery  infarct, managed s/p TNK, suspected to be secondary to stenosis of left internal carotid artery associated with atherosclerosis.  Discharged on dual antiplatelet therapy to be continued for 3 months following which aspirin 81 mg indefinitely Also has history of CAD inferior STEMI September 2018 s/p PCI of RCA, mild cardiomyopathy with LVEF 45 to 50% in September 2018, recent repeat echocardiogram LVEF 50 to 55% September 2024, carotid artery disease, hypertension, hyperlipidemia/hypertriglyceridemia, prediabetes, tobacco use, peripheral arterial disease with bilateral femoral artery occlusion, recommended outpatient follow-up with vascular surgeon, chronic back pain, marijuana use.  Here today to establish care with cardiology.  His last cardiology visit was back in 2020 with Dr. Sherryll Burger at [facility].  Mentions since his stroke his recovery has gone well.  He is doing things independently.  Denies any chest pain.  Does have shortness of breath which he attributes to his cough and COPD and ongoing smoking.  Denies any palpitations, lightheadedness, dizziness or syncopal episodes.  Denies any leg pain.  Denies any open wounds.  Does have mild right lower extremity edema which she mentions has been chronic.  Mentions blood pressures at home well-controlled typically systolic 120s and diastolic in 80s.  Readings today elevated he attributes to the visit and having walked right before the blood pressures were checked.  Continues to smoke a pack of cigarettes a day.  Using nicotine patches to help him cut down but has been having difficult time renewing prescriptions at [facility].  Echocardiogram 04/26/2023 noted LVEF 50 to 55% without any significant wall motion abnormalities, grade 1 diastolic dysfunction, normal RV function, technically difficult study to assess aortic valve anatomy, no major valve abnormalities reported.  Last EKG available for comparison is from separate the 16th 2024 showing sinus rhythm  heart rate 98/min, inferior Q waves.  Prior coronary angiogram from September 2018 reported proximal to mid RCA 100% stenosis underwent successful PCI, mild LV dysfunction with EF 45 to 50% by visual estimate and LVEDP elevated  Last blood work available to review is from 06/16/2023 sodium 140, potassium 5.3, 6 mild hyperkalemia BUN 23, creatinine 1.17, EGFR 72, suggest CKD stage II AST mildly elevated 41, ALT normal 34, alkaline phosphatase normal 92 CBC was unremarkable hemoglobin 15.7, hematocrit 46.9, WBC 6.9, platelets 210  Past Medical History:  Diagnosis Date   Coronary artery disease    Hyperlipidemia    Hypertension    Lumbar disc disease    Myocardial infarction (HCC)    9/18 PCI/DESx1 to RCA, EF 45%   Smoking    Systolic heart failure (HCC)     Past Surgical History:  Procedure Laterality Date   CARDIAC CATHETERIZATION     CORONARY/GRAFT ACUTE MI REVASCULARIZATION N/A 04/25/2017   Procedure: Coronary/Graft Acute MI Revascularization;  Surgeon: Runell Gess, MD;  Location: [facility] INVASIVE CV LAB;  Service: Cardiovascular;  Laterality: N/A;   KNEE SURGERY     LEFT HEART CATH AND CORONARY ANGIOGRAPHY N/A 04/25/2017   Procedure: LEFT HEART CATH AND CORONARY ANGIOGRAPHY;  Surgeon: Runell Gess, MD;  Location: [facility] INVASIVE CV LAB;  Service: Cardiovascular;  Laterality: N/A;  LUMBAR LAMINECTOMY      Current Medications: Current Meds  Medication Sig   acetaminophen (TYLENOL) 325 MG tablet Take 1-2 tablets (325-650 mg total) by mouth every 4 (four) hours as needed for mild pain.   amLODipine (NORVASC) 10 MG tablet Take 1 tablet (10 mg total) by mouth daily.   aspirin EC 81 MG tablet Take 1 tablet (81 mg total) by mouth daily. Swallow whole.   clopidogrel (PLAVIX) 75 MG tablet Take 1 tablet (75 mg total) by mouth daily.   Santiago-methylfolate-B6-B12 (METANX) 3-35-2 MG TABS tablet Take 1 tablet by mouth daily.   lidocaine (LIDODERM) 5 % Place 1 patch onto the skin daily. Remove &  Discard patch within 12 hours or as directed by MD   losartan (COZAAR) 100 MG tablet Take 1 tablet (100 mg total) by mouth daily.   magnesium oxide (MAG-OX) 400 MG tablet Take 1 tablet (400 mg total) by mouth at bedtime.   metoprolol tartrate (LOPRESSOR) 50 MG tablet Take 1 tablet (50 mg total) by mouth 2 (two) times daily.   nicotine (NICODERM CQ - DOSED IN MG/24 HOURS) 21 mg/24hr patch Place 1 patch (21 mg total) onto the skin daily as needed (reduce craving for cigarettes).   pantoprazole (PROTONIX) 40 MG tablet Take 1 tablet (40 mg total) by mouth at bedtime.   rosuvastatin (CRESTOR) 40 MG tablet Take 1 tablet (40 mg total) by mouth daily.   Vitamin D, Ergocalciferol, (DRISDOL) 1.25 MG (50000 UNIT) CAPS capsule Take 1 capsule (50,000 Units total) by mouth every 7 (seven) days.     Allergies:   Penicillins   Social History   Socioeconomic History   Marital status: Divorced    Spouse name: Not on file   Number of children: Not on file   Years of education: Not on file   Highest education level: Not on file  Occupational History   Not on file  Tobacco Use   Smoking status: Every Day    Current packs/day: 1.00    Average packs/day: 1 pack/day for 40.0 years (40.0 ttl pk-yrs)    Types: Cigarettes   Smokeless tobacco: Never  Substance and Sexual Activity   Alcohol use: Not Currently   Drug use: Yes    Types: Marijuana    Comment: once in a while to help him sleep   Sexual activity: Yes  Other Topics Concern   Not on file  Social History Narrative   Not on file   Social Determinants of Health   Financial Resource Strain: Not on file  Food Insecurity: Food Insecurity Present (04/30/2023)   Hunger Vital Sign    Worried About Running Out of Food in the Last Year: Never true    Ran Out of Food in the Last Year: Sometimes true  Transportation Needs: No Transportation Needs (04/30/2023)   PRAPARE - Administrator, Civil Service (Medical): No    Lack of Transportation  (Non-Medical): No  Physical Activity: Not on file  Stress: Not on file  Social Connections: Not on file     Family History: The patient's family history includes Coronary artery disease in his father; Diabetes in his mother. ROS:   Please see the history of present illness.    All 14 point review of systems negative except as described per history of present illness.  EKGs/Labs/Other Studies Reviewed:    The following studies were reviewed today:  Cardiac Studies & Procedures   CARDIAC CATHETERIZATION  CARDIAC CATHETERIZATION 04/25/2017  Narrative  Images from the original result were not included.   Prox RCA to Mid RCA lesion, 100 %stenosed.  There is mild left ventricular systolic dysfunction.  The left ventricular ejection fraction is 45-50% by visual estimate.  LV end diastolic pressure is moderately elevated.  WILDON LADUE is a 62 y.o. male   [id number] LOCATION:  FACILITY: [facility] PHYSICIAN: Nanetta Batty, M.D. 22-Jan-1961   DATE OF PROCEDURE:  04/25/2017  DATE OF DISCHARGE:     CARDIAC CATHETERIZATION / RCA-DES/ TTVPM    History obtained from chart review. Mr. Castillo is a 62 year old thin appearing single Caucasian male without prior cardiac history. He does have a history of hypertension and tobacco abuse. He developed sudden onset of chest pain approximately one hour prior to presentation. EMS was called. His EKG showed inferior ST segment elevation with extension to lateral leads and severe bradycardia/heart block. Upon presentation his heart rate was in the mid 30s and his blood pressure was 70. He was brought emergently to the Cath Lab for stabilization, angiography and intervention.   PROCEDURE DESCRIPTION:  The patient was brought to the second floor [facility] cath lab in the postabsorptive state. He was not premedicated . His right groin was prepped and shaved in usual sterile fashion. Xylocaine 1% was used for local anesthesia. A 6  French sheath was inserted into the right common femoral arteryAnd a 6 French sheath was inserted into the right common femoral vein using standard Seldinger technique. A balloon tipped temporary transverse pacemaker was placed in the RV apex with thresholds measured down to less than 5 mA. This was placed at 60 bpm. He was placed on Levothroid for hypotension. Once his heart rate improved to 60's blood pressure did as well. 5 French left Judkins diagnostic catheters along with a 5 French pigtail catheter were used for selective coronary angiography and left ventriculography respectively. Isovue dye was used for the entirety of the case. Retrograde aortic, left ventricular and pullback pressures were recorded.  The patient received Angiomax bolus followed by infusion with a therapeutic ACT demonstrated. He received Proventil 180 mg by mouth load. Isovue was used for the entirety of the intervention. Retrograde pressure was monitored in the case. I fairly easily crossed the lesion with an 014 guidewire and predilated with a 2 mm x 12 mm balloon restoring antegrade flow with a door to balloon time of 20 minutes. There was a large thrombus burden and therefore used a Pronto aspiration thrombectomy catheter removed a copious amount of white thrombus. This markedly improved antegrade flow. In addition, I gave a bolus of Aggrastat. I then stented the lesion with a 3 mm x 20 mm long synergy drug-eluting stent delivered at 18 atm and postdilated with 3.5 x 15 mm long noncompliant balloon at 18 atm (3.4 mm) resulting in reduction of the total occlusion to 0% residual with TIMI-3 flow. There was no visible thrombus. The patient tolerated the procedure well. He was somewhat tachycardic during this time and hypertensive and therefore his Levophed was discontinued. The guidewire and catheter were removed. The sheaths including the temporary transvenous pacemaker was secured in place.  Impression Successful RCA PCI and  drug-eluting stenting in the setting of a large inferior wall myocardial infarction. By complete heart block, bradycardia and cardiogenic shock. He was treated with aspiration thrombectomy followed by drug eluding stenting using a synergy millimeter by 20 mm long drug-eluting stent postdilated to 3.4 mm. The door to  to balloon time was 20 minutes. Once antegrade  flow was restored blood pressure improved as did his heart rate. Aggrastat was discontinued. Angiomax will continue for 4 hours at full dose. The femoral arterial sheath will be removed. I will maintain the femoral venous sheath and pacemaker until the morning in case he becomes bradycardic overnight. His EF was in the 45-50% range. He can be fast tracked the most likely discharged on Tuesday or Wednesday. He will be treated with appropriate/usual post-MI pharmacology including low-dose beta blocker, antiplatelet therapy which he will continue for a minimum of 12 months and high-dose statin therapy. He left the lab in stable condition.  Nanetta Batty. MD, [facility] 04/25/2017 6:53 PM  Findings Coronary Findings Diagnostic  Dominance: Right  Right Coronary Artery  Right Posterior Descending Artery Collaterals RPDA filled by collaterals from [facility].  Intervention  No interventions have been documented.     ECHOCARDIOGRAM  ECHOCARDIOGRAM COMPLETE 04/26/2023  Narrative ECHOCARDIOGRAM REPORT    Patient Name:   ZAKIAH HADE Date of Exam: 04/26/2023 Medical Rec #:  [id number]          Height:       67.0 in Accession #:    [id number]         Weight:       153.7 lb Date of Birth:  07/30/1961          BSA:          1.808 m Patient Age:    61 years           BP:           180/104 mmHg Patient Gender: M                  HR:           86 bpm. Exam Location:  Inpatient  Procedure: 2D Echo, Cardiac Doppler and Color Doppler  Indications:    Stroke I63.9  History:        Patient has prior history of Echocardiogram examinations,  most recent 04/26/2017. CHF, STEMI, Previous Myocardial Infarction and CAD, Stroke and Cardiogenic Shock, Arrythmias:CHB; Risk Factors:Current Smoker, Hypertension and Dyslipidemia.  Sonographer:    Aron Baba Referring Phys: [id number] [facility]   Sonographer Comments: No parasternal window. IMPRESSIONS   1. Left ventricular ejection fraction, by estimation, is 50 to 55%. The left ventricle has low normal function. The left ventricle has no regional wall motion abnormalities. Left ventricular diastolic parameters are consistent with Grade I diastolic dysfunction (impaired relaxation). 2. Right ventricular systolic function is normal. The right ventricular size is normal. Tricuspid regurgitation signal is inadequate for assessing PA pressure. 3. The mitral valve is normal in structure. No evidence of mitral valve regurgitation. No evidence of mitral stenosis. 4. The aortic valve has an indeterminant number of cusps. Aortic valve regurgitation is not visualized. No aortic stenosis is present. 5. The inferior vena cava is normal in size with greater than 50% respiratory variability, suggesting right atrial pressure of 3 mmHg.  FINDINGS Left Ventricle: Left ventricular ejection fraction, by estimation, is 50 to 55%. The left ventricle has low normal function. The left ventricle has no regional wall motion abnormalities. The left ventricular internal cavity size was normal in size. There is no left ventricular hypertrophy. Left ventricular diastolic parameters are consistent with Grade I diastolic dysfunction (impaired relaxation).  Right Ventricle: The right ventricular size is normal. Right ventricular systolic function is normal. Tricuspid regurgitation signal is inadequate for assessing PA pressure. The tricuspid regurgitant velocity is 0.94 m/s,  and with an assumed right atrial pressure of 3 mmHg, the estimated right ventricular systolic pressure is 6.5 mmHg.  Left Atrium: Left  atrial size was normal in size.  Right Atrium: Right atrial size was normal in size.  Pericardium: There is no evidence of pericardial effusion.  Mitral Valve: The mitral valve is normal in structure. No evidence of mitral valve regurgitation. No evidence of mitral valve stenosis.  Tricuspid Valve: The tricuspid valve is normal in structure. Tricuspid valve regurgitation is trivial. No evidence of tricuspid stenosis.  Aortic Valve: The aortic valve has an indeterminant number of cusps. Aortic valve regurgitation is not visualized. No aortic stenosis is present.  Pulmonic Valve: The pulmonic valve was not well visualized. Pulmonic valve regurgitation is not visualized. No evidence of pulmonic stenosis.  Aorta: The aortic root is normal in size and structure.  Venous: The inferior vena cava is normal in size with greater than 50% respiratory variability, suggesting right atrial pressure of 3 mmHg.  IAS/Shunts: No atrial level shunt detected by color flow Doppler.   LEFT VENTRICLE PLAX 2D LVIDd:         4.30 cm   Diastology LVIDs:         3.10 cm   LV e' medial:    6.31 cm/s LV PW:         1.00 cm   LV E/e' medial:  10.7 LV IVS:        0.70 cm   LV e' lateral:   5.98 cm/s LVOT diam:     1.50 cm   LV E/e' lateral: 11.3 LVOT Area:     1.77 cm   RIGHT VENTRICLE RV S prime:     11.80 cm/s TAPSE (M-mode): 1.3 cm  LEFT ATRIUM             Index       RIGHT ATRIUM           Index LA diam:        3.10 cm 1.71 cm/m  RA Area:     10.50 cm LA Vol (A2C):   15.3 ml 8.46 ml/m  RA Volume:   19.80 ml  10.95 ml/m LA Vol (A4C):   17.8 ml 9.85 ml/m LA Biplane Vol: 17.7 ml 9.79 ml/m  AORTA Ao Root diam: 3.40 cm Ao Asc diam:  3.50 cm  MITRAL VALVE               TRICUSPID VALVE MV Area (PHT): 4.17 cm    TR Peak grad:   3.5 mmHg MV Decel Time: 182 msec    TR Vmax:        93.80 cm/s MR Peak grad: 6.9 mmHg MR Vmax:      131.00 cm/s  SHUNTS MV E velocity: 67.30 cm/s  Systemic Diam: 1.50  cm MV A velocity: 99.80 cm/s MV E/A ratio:  0.67  Olga Millers MD Electronically signed by Olga Millers MD Signature Date/Time: 04/26/2023/2:07:07 PM    Final             EKG:       Recent Labs: 05/01/2023: Magnesium 2.1 06/16/2023: ALT 34; BUN 23; Creatinine, Ser 1.17; Hemoglobin 15.7; Platelets 210; Potassium 5.3; Sodium 140  Recent Lipid Panel    Component Value Date/Time   CHOL 261 (H) 04/26/2023 0340   TRIG 281 (H) 04/26/2023 1145   HDL 34 (Santiago) 04/26/2023 0340   CHOLHDL 7.7 04/26/2023 0340   VLDL UNABLE TO CALCULATE IF TRIGLYCERIDE OVER 400 mg/dL  04/26/2023 0340   LDLCALC UNABLE TO CALCULATE IF TRIGLYCERIDE OVER 400 mg/dL 16/05/9603 5409   LDLDIRECT 166 (H) 04/26/2023 0340    Physical Exam:    VS:  BP (!) 159/97 (BP Location: Left Arm, Patient Position: Sitting, Cuff Size: Normal)   Pulse 76   Ht 5\' 7"  (1.702 m)   Wt 156 lb (70.8 kg)   SpO2 93%   BMI 24.43 kg/m     Wt Readings from Last 3 Encounters:  06/23/23 156 lb (70.8 kg)  06/16/23 154 lb 2 oz (69.9 kg)  05/07/23 145 lb 1 oz (65.8 kg)     GENERAL:  Well nourished, well developed in no acute distress NECK: No JVD; No carotid bruits CARDIAC: RRR, S1 and S2 present, no murmurs, no rubs, no gallops CHEST:  Clear to auscultation without rales, wheezing or rhonchi  Extremities: Mild 1+ pitting edema of the right lower extremity.  No significant edema of the left lower extremity.. Pulses bilaterally symmetric with radial 2+  NEUROLOGIC:  Alert and oriented x 3  Medication Adjustments/Labs and Tests Ordered: Current medicines are reviewed at length with the patient today.  Concerns regarding medicines are outlined above.  No orders of the defined types were placed in this encounter.  No orders of the defined types were placed in this encounter.   Signed, Cecille Amsterdam, MD, MPH, [facility]. 06/23/2023 10:33 AM    [facility]

## 2023-06-23 NOTE — Assessment & Plan Note (Signed)
Remains asymptomatic at this time. Reviewed his overall risk for cardiac disease. Continue with aspirin 81 mg indefinitely. Continue with rosuvastatin 40 mg once daily.  He is currently on dual antiplatelet therapy with Plavix 75 mg once daily for 3 months since his recent stroke, tentatively to continue until December 20.

## 2023-06-23 NOTE — Assessment & Plan Note (Signed)
Suboptimal today.   Advised him to keep all of his blood pressure readings over the next 7 to 10 days and give our office a call. If the blood pressure readings at home are consistently above 130 over 80 mmHg, we will have to titrate up his medications. Continue his current dose of amlodipine 10 mg once daily Metoprolol tartrate 50 mg twice daily Losartan 100 mg once daily  Advised him to consume plenty of fluids through the day to help with diuresis given there is mild upper limits of normal potassium levels detected on recent blood work from 06/16/2023.

## 2023-08-09 ENCOUNTER — Other Ambulatory Visit: Payer: Self-pay

## 2023-08-09 NOTE — Patient Outreach (Signed)
 First telephone outreach attempt to obtain mRS. No answer. Left message for returned call.  Vanice Sarah Care Management Assistant [phone]

## 2023-08-12 ENCOUNTER — Other Ambulatory Visit: Payer: Self-pay

## 2023-08-12 DIAGNOSIS — E559 Vitamin D deficiency, unspecified: Secondary | ICD-10-CM

## 2023-08-12 MED ORDER — VITAMIN D (ERGOCALCIFEROL) 1.25 MG (50000 UNIT) PO CAPS: 50000.0000 [IU] | ORAL_CAPSULE | ORAL | 1 refills | Status: DC

## 2023-08-12 NOTE — Patient Outreach (Signed)
 Second telephone outreach attempt to obtain mRS. No answer. Left message for returned call.  Vanice Sarah Care Management Assistant [phone]

## 2023-08-13 ENCOUNTER — Other Ambulatory Visit: Payer: Self-pay

## 2023-08-13 NOTE — Patient Outreach (Signed)
3 outreach attempts were completed to obtain mRs. mRs could not be obtained because patient never returned my calls. mRs=7    [place] Management Assistant [phone]

## 2023-08-16 ENCOUNTER — Encounter: Payer: Medicaid Other | Admitting: Student

## 2023-09-20 DIAGNOSIS — M519 Unspecified thoracic, thoracolumbar and lumbosacral intervertebral disc disorder: Secondary | ICD-10-CM | POA: Insufficient documentation

## 2023-09-20 DIAGNOSIS — I219 Acute myocardial infarction, unspecified: Secondary | ICD-10-CM | POA: Insufficient documentation

## 2023-09-22 ENCOUNTER — Other Ambulatory Visit: Payer: Self-pay

## 2023-09-22 DIAGNOSIS — F17200 Nicotine dependence, unspecified, uncomplicated: Secondary | ICD-10-CM | POA: Insufficient documentation

## 2023-09-22 DIAGNOSIS — I5020 Unspecified systolic (congestive) heart failure: Secondary | ICD-10-CM | POA: Insufficient documentation

## 2023-09-22 DIAGNOSIS — E785 Hyperlipidemia, unspecified: Secondary | ICD-10-CM | POA: Insufficient documentation

## 2023-09-22 DIAGNOSIS — I2510 Atherosclerotic heart disease of native coronary artery without angina pectoris: Secondary | ICD-10-CM | POA: Insufficient documentation

## 2023-09-23 ENCOUNTER — Ambulatory Visit: Payer: Medicaid Other

## 2023-09-23 DIAGNOSIS — M519 Unspecified thoracic, thoracolumbar and lumbosacral intervertebral disc disorder: Secondary | ICD-10-CM

## 2023-10-05 ENCOUNTER — Other Ambulatory Visit: Payer: Self-pay | Admitting: Student

## 2023-10-05 DIAGNOSIS — I10 Essential (primary) hypertension: Secondary | ICD-10-CM

## 2023-10-08 ENCOUNTER — Other Ambulatory Visit: Payer: Self-pay | Admitting: Student

## 2023-10-08 DIAGNOSIS — I63522 Cerebral infarction due to unspecified occlusion or stenosis of left anterior cerebral artery: Secondary | ICD-10-CM

## 2023-12-10 ENCOUNTER — Other Ambulatory Visit: Payer: Self-pay

## 2023-12-10 ENCOUNTER — Emergency Department (HOSPITAL_COMMUNITY)

## 2023-12-10 ENCOUNTER — Emergency Department (HOSPITAL_COMMUNITY)
Admission: EM | Admit: 2023-12-10 | Discharge: 2023-12-10 | Disposition: A | Discharge status: Home / Self Care | Attending: Student | Admitting: Student

## 2023-12-10 ENCOUNTER — Encounter (HOSPITAL_COMMUNITY): Payer: Self-pay

## 2023-12-10 DIAGNOSIS — F17210 Nicotine dependence, cigarettes, uncomplicated: Secondary | ICD-10-CM | POA: Diagnosis not present

## 2023-12-10 DIAGNOSIS — Z8673 Personal history of transient ischemic attack (TIA), and cerebral infarction without residual deficits: Secondary | ICD-10-CM | POA: Insufficient documentation

## 2023-12-10 DIAGNOSIS — K219 Gastro-esophageal reflux disease without esophagitis: Secondary | ICD-10-CM | POA: Insufficient documentation

## 2023-12-10 DIAGNOSIS — Z7901 Long term (current) use of anticoagulants: Secondary | ICD-10-CM | POA: Insufficient documentation

## 2023-12-10 DIAGNOSIS — Z951 Presence of aortocoronary bypass graft: Secondary | ICD-10-CM | POA: Insufficient documentation

## 2023-12-10 DIAGNOSIS — R079 Chest pain, unspecified: Secondary | ICD-10-CM | POA: Diagnosis present

## 2023-12-10 DIAGNOSIS — Z7982 Long term (current) use of aspirin: Secondary | ICD-10-CM | POA: Diagnosis not present

## 2023-12-10 LAB — BASIC METABOLIC PANEL WITH GFR
Anion gap: 12 (ref 5–15)
BUN: 19 mg/dL (ref 6–20)
CO2: 22 mmol/L (ref 22–32)
Calcium: 9.2 mg/dL (ref 8.9–10.3)
Chloride: 104 mmol/L (ref 98–111)
Creatinine, Ser: 1.33 mg/dL — ABNORMAL HIGH (ref 0.61–1.24)
GFR, Estimated: >60 mL/min (ref >60)
Glucose, Bld: 98 mg/dL (ref 70–99)
Potassium: 4.1 mmol/L (ref 3.5–5.1)
Sodium: 138 mmol/L (ref 135–145)

## 2023-12-10 LAB — CBC
HCT: 50.6 % (ref 39.0–52.0)
Hemoglobin: 16.7 g/dL (ref 13.0–17.0)
MCH: 30 pg (ref 26.0–34.0)
MCHC: 33 g/dL (ref 30.0–36.0)
MCV: 90.8 fL (ref 80.0–100.0)
Platelets: 190 10*3/uL (ref 150–400)
RBC: 5.57 MIL/uL (ref 4.22–5.81)
RDW: 14.3 % (ref 11.5–15.5)
WBC: 8.9 10*3/uL (ref 4.0–10.5)
nRBC: 0 % (ref 0.0–0.2)

## 2023-12-10 LAB — TROPONIN I (HIGH SENSITIVITY)
Troponin I (High Sensitivity): 6 ng/L (ref <18)
Troponin I (High Sensitivity): 6 ng/L (ref <18)

## 2023-12-10 MED ORDER — LIDOCAINE VISCOUS HCL 2 % MT SOLN
15.0000 mL | Freq: Once | OROMUCOSAL | Status: AC
  Administered 2023-12-10: 15 mL via ORAL
  Filled 2023-12-10: qty 15

## 2023-12-10 MED ORDER — PANTOPRAZOLE SODIUM 40 MG IV SOLR
40.0000 mg | Freq: Once | INTRAVENOUS | Status: AC
  Administered 2023-12-10: 40 mg via INTRAVENOUS
  Filled 2023-12-10: qty 10

## 2023-12-10 MED ORDER — ALUM & MAG HYDROXIDE-SIMETH 200-200-20 MG/5ML PO SUSP
30.0000 mL | Freq: Once | ORAL | Status: AC
  Administered 2023-12-10: 30 mL via ORAL
  Filled 2023-12-10: qty 30

## 2023-12-10 MED ORDER — NITROGLYCERIN 0.4 MG SL SUBL
0.4000 mg | SUBLINGUAL_TABLET | SUBLINGUAL | Status: DC | PRN
  Administered 2023-12-10: 0.4 mg via SUBLINGUAL
  Filled 2023-12-10: qty 1

## 2023-12-10 NOTE — ED Notes (Signed)
 CCMD called and notified

## 2023-12-10 NOTE — ED Triage Notes (Signed)
 BIB [facility] from home, Patient woke up with chest pain that started 0630, Patient denies headache, dizziness, blurry vision, Patient has a history of stent placed in 2018. Pain 6/10

## 2023-12-10 NOTE — Discharge Instructions (Addendum)
 Please follow-up with your cardiologist in regards to recent symptoms and ER visit.  Today your labs and imaging ultimately reassuring and your symptoms may be related to acid reflux.  Please take your Nexium as prescribed take her medications as prescribed as well.  If symptoms change or worsen please return to the ER.

## 2023-12-10 NOTE — ED Provider Notes (Signed)
 [place] [facility]ERGENCY DEPARTMENT AT [facility] Provider Note   CSN: [id number] Arrival date & time: 12/10/23  4132     History  Chief Complaint  Patient presents with   Chest Pain    Lance Santiago is a 63 y.o. male history of GERD, left ACA stroke, PVD, CABG with inferior STEMI September 2018, cigarette use presented for chest pain that began at 630 this morning.  Patient states he went to his primary care's office on Tuesday and was told that he has had previous heart attacks based on the EKG however was not have any chest pain at this time.  Patient is he woke up with the chest pain that is a burning-like sensation in his chest.  Patient states he did not get relief after taking 324 of aspirin  at home.  Patient denies any leg swelling, shortness of breath, new onset weakness, numbness, radiation of this chest pain.  Patient is on aspirin  and Plavix  daily and is on blood pressure meds and has been compliant.  Patient is not on any blood thinners such as Eliquis Xarelto or warfarin.  Home Medications Prior to Admission medications   Medication Sig Start Date End Date Taking? Authorizing Provider  acetaminophen  (TYLENOL ) 325 MG tablet Take 1-2 tablets (325-650 mg total) by mouth every 4 (four) hours as needed for mild pain. 05/05/23   Love, Renay Carota, PA-C  amLODipine  (NORVASC ) 10 MG tablet Take 1 tablet (10 mg total) by mouth daily. 06/16/23   Leak, Brandi L, NP  aspirin  EC 81 MG tablet Take 1 tablet (81 mg total) by mouth daily. Swallow whole. 05/05/23   Love, Renay Carota, PA-C  clopidogrel  (PLAVIX ) 75 MG tablet Take 1 tablet by mouth once daily 10/08/23   Leak, Brandi L, NP  l-methylfolate-B6-B12 (METANX) 3-35-2 MG TABS tablet Take 1 tablet by mouth daily. 05/06/23   Love, Renay Carota, PA-C  lidocaine  (LIDODERM ) 5 % Place 1 patch onto the skin daily. Remove & Discard patch within 12 hours or as directed by MD 05/05/23   Love, Renay Carota, PA-C  losartan  (COZAAR ) 100 MG tablet Take 100 mg by  mouth daily.    [provider]  magnesium  oxide (MAG-OX) 400 MG tablet Take 1 tablet (400 mg total) by mouth at bedtime. 05/05/23   Love, Renay Carota, PA-C  metoprolol  tartrate (LOPRESSOR ) 50 MG tablet Take 1 tablet by mouth twice daily 10/05/23   Leak, Brandi L, NP  nicotine  (NICODERM CQ  - DOSED IN MG/24 HOURS) 21 mg/24hr patch Place 1 patch (21 mg total) onto the skin daily as needed (reduce craving for cigarettes). 06/16/23   Leak, Brandi L, NP  pantoprazole  (PROTONIX ) 40 MG tablet Take 1 tablet (40 mg total) by mouth at bedtime. 06/16/23   Leak, Brandi L, NP  rosuvastatin  (CRESTOR ) 40 MG tablet Take 40 mg by mouth daily.    [provider]  Vitamin D , Ergocalciferol , (DRISDOL ) 1.25 MG (50000 UNIT) CAPS capsule Take 1 capsule (50,000 Units total) by mouth every 7 (seven) days. 08/12/23   Leak, Brandi L, NP      Allergies    Penicillins    Review of Systems   Review of Systems  Cardiovascular:  Positive for chest pain.    Physical Exam Updated Vital Signs BP (!) 162/90 (BP Location: Left Arm)   Pulse 67   Temp 98.5 F (36.9 C) (Oral)   Resp 18   Ht 5\' 7"  (1.702 m)   Wt 70.8 kg  SpO2 100%   BMI 24.45 kg/m  Physical Exam Vitals reviewed.  Constitutional:      General: He is not in acute distress. HENT:     Head: Normocephalic and atraumatic.  Eyes:     Extraocular Movements: Extraocular movements intact.     Conjunctiva/sclera: Conjunctivae normal.     Pupils: Pupils are equal, round, and reactive to light.  Cardiovascular:     Rate and Rhythm: Normal rate and regular rhythm.     Pulses: Normal pulses.     Heart sounds: Normal heart sounds.     Comments: 2+ bilateral radial/dorsalis pedis pulses with regular rate Pulmonary:     Effort: Pulmonary effort is normal. No respiratory distress.     Breath sounds: Normal breath sounds.  Abdominal:     Palpations: Abdomen is soft.     Tenderness: There is no abdominal tenderness. There is no guarding or rebound.   Musculoskeletal:        General: Normal range of motion.     Cervical back: Normal range of motion and neck supple.     Right lower leg: No tenderness. No edema.     Left lower leg: No tenderness. No edema.     Comments: 5 out of 5 bilateral grip/leg extension strength  Skin:    General: Skin is warm and dry.     Capillary Refill: Capillary refill takes less than 2 seconds.  Neurological:     General: No focal deficit present.     Mental Status: He is alert and oriented to person, place, and time.     Comments: Sensation intact in all 4 limbs  Psychiatric:        Mood and Affect: Mood normal.     ED Results / Procedures / Treatments   Labs (all labs ordered are listed, but only abnormal results are displayed) Labs Reviewed  BASIC METABOLIC PANEL WITH GFR - Abnormal; Notable for the following components:      Result Value   Creatinine, Ser 1.33 (*)    All other components within normal limits  CBC  TROPONIN I (HIGH SENSITIVITY)  TROPONIN I (HIGH SENSITIVITY)    EKG None  Radiology CT Chest Wo Contrast Result Date: 12/10/2023 CLINICAL DATA:  Chest pain with an abnormal chest x-ray. EXAM: CT CHEST WITHOUT CONTRAST TECHNIQUE: Multidetector CT imaging of the chest was performed following the standard protocol without IV contrast. RADIATION DOSE REDUCTION: This exam was performed according to the departmental dose-optimization program which includes automated exposure control, adjustment of the mA and/or kV according to patient size and/or use of iterative reconstruction technique. COMPARISON:  None Available. FINDINGS: Cardiovascular: There is mild to moderate severity calcification of the aortic arch and descending thoracic aorta, without evidence of aortic aneurysm. Normal heart size with marked severity coronary artery calcification. No pericardial effusion. Mediastinum/Nodes: No enlarged mediastinal or axillary lymph nodes. Thyroid gland, trachea, and esophagus demonstrate no  significant findings. Lungs/Pleura: There is very mild right middle lobe and right basilar linear scarring and/or atelectasis. No acute infiltrate, pleural effusion or pneumothorax is identified. Upper Abdomen: No acute abnormality. Musculoskeletal: No chest wall mass or suspicious bone lesions identified. IMPRESSION: 1. Very mild right middle lobe and right basilar linear scarring and/or atelectasis. 2. Marked severity coronary artery calcification. 3. Aortic atherosclerosis. Electronically Signed   By: Virgle Grime M.D.   On: 12/10/2023 11:25   DG Chest Port 1 View Result Date: 12/10/2023 CLINICAL DATA:  Chest pain EXAM: PORTABLE CHEST 1 VIEW  COMPARISON:  10/12/2013 FINDINGS: The lungs are clear without focal pneumonia, edema, pneumothorax or pleural effusion. Streaky density at the right base is compatible with atelectasis. Subtle nodular density seen in the right mid lung. The cardiopericardial silhouette is within normal limits for size. No acute bony abnormality. Telemetry leads overlie the chest. IMPRESSION: 1. Right base atelectasis. 2. Subtle nodular density in the right mid lung. CT chest without contrast recommended to further evaluate. Electronically Signed   By: Donnal Fusi M.D.   On: 12/10/2023 08:14    Procedures Procedures    Medications Ordered in ED Medications  nitroGLYCERIN  (NITROSTAT ) SL tablet 0.4 mg (0.4 mg Sublingual Given 12/10/23 0812)  alum & mag hydroxide-simeth (MAALOX/MYLANTA) 200-200-20 MG/5ML suspension 30 mL (has no administration in time range)    And  lidocaine  (XYLOCAINE ) 2 % viscous mouth solution 15 mL (has no administration in time range)  pantoprazole  (PROTONIX ) injection 40 mg (40 mg Intravenous Given 12/10/23 1610)    ED Course/ Medical Decision Making/ A&P                                 Medical Decision Making Amount and/or Complexity of Data Reviewed Labs: ordered. Radiology: ordered.  Risk Prescription drug management.   Lance Santiago 63 y.o. presented today for chest pain. Working DDx that I considered at this time includes, but not limited to, ACS, pulmonary embolism, community-acquired pneumonia, aortic dissection, pneumothorax, underlying bony abnormality, anemia, thyrotoxicosis, HTN urgency/emergency, esophageal rupture, CHF exacerbation, valvular disorder, myocarditis, pericarditis, endocarditis, pericardial effusion/cardiac tamponade, pulmonary edema, gastritis/PUD/GERD, esophagitis, MSK.  R/o Dx: ACS, pulmonary embolism, community-acquired pneumonia, aortic dissection, pneumothorax, underlying bony abnormality, anemia, thyrotoxicosis, HTN urgency/emergency, esophageal rupture, CHF exacerbation, valvular disorder, myocarditis, pericarditis, endocarditis, pericardial effusion/cardiac tamponade, pulmonary edema, MSK: These are considered less likely due to history of present illness and physical exam findings. Aortic Dissection: less likely based on the location, quality, onset, and severity of symptoms in this case. Patient also has a lack of underlying history of AD or TAA.   Review of prior external notes: 08/16/2019 ED  Unique Tests and My Interpretation:  EKG: Rate, rhythm, axis, intervals all examined and without medically relevant abnormality. ST segments without concerns for elevations Troponin: 6, 6 CXR: Subtle nodular density, recommend CT chest without for further evaluation CT chest without contrast: No acute findings CBC: Unremarkable BMP: Unremarkable  Social Determinants of Health: none  Discussion with Independent Historian:  EMS  Discussion of Management of Tests: None  Risk: Medium: prescription drug management  Risk Stratification Score: None  Staffed with: Kommor, MD  Plan: On exam patient was in no acute distress with stable vitals. Patient's physical was unremarkable. Labs and CXR will be ordered.  The cardiac monitor was ordered secondary to the patient's history of chest pain and  to monitor the patient for dysrhythmia. The cardiac monitor revealed normal sinus as interpreted by me.  Patient received full dose aspirin  this morning but states he is still having symptoms so we will give him 1 dose of nitroglycerin  and some Protonix  as he does endorse a burning sensation which could be related to his acid reflux however given the fact his history of STEMI we will give nitro to see if this helps as well.  Patient stable at this time.  Patient was reevaluated.  By my independent interpretation of the patient's cardiac monitor, the monitor shows normal sinus.  Patient exam shows patient's chest  pain is improved with Protonix  and nitro here.  Patient states the burning sensation is going away and so I do feel that if the delta troponins are negative patient's chest pain is acid reflux in nature.  Delta troponins are negative.  Chest x-ray did show some irregularity and recommended a CT scan which ultimately was unremarkable as well.  At this time patient is chest pain-free.  Do feel patient's chest pain is related to his acid reflux as patient states he has not been taking his Nexium the past few days.  I do recommend that he takes these and follows up closely with his cardiologist and primary care prior to which he agreed.  Patient states he is okay with being discharged at this time and is chest pain-free at time of discharge.  Patient was given return precautions. Patient stable for discharge at this time.  Patient verbalized understanding of plan.  Discussed smoking cessation with patient and they were offered resources to help stop.  Total time was 5 min CPT code 47829.   This chart was dictated using voice recognition software.  Despite best efforts to proofread,  errors can occur which can change the documentation meaning.         Final Clinical Impression(s) / ED Diagnoses Final diagnoses:  Gastroesophageal reflux disease, unspecified whether esophagitis present    Rx  / DC Orders ED Discharge Orders     None         Elex Grimmer 12/10/23 1152    Kommor, Alyse July, MD 12/11/23 1318

## 2024-06-08 ENCOUNTER — Ambulatory Visit

## 2024-06-08 VITALS — BP 140/92 | HR 64 | Ht 67.0 in | Wt 143.8 lb

## 2024-06-08 DIAGNOSIS — Z720 Tobacco use: Secondary | ICD-10-CM | POA: Diagnosis present

## 2024-06-08 DIAGNOSIS — I739 Peripheral vascular disease, unspecified: Secondary | ICD-10-CM | POA: Insufficient documentation

## 2024-06-08 DIAGNOSIS — I779 Disorder of arteries and arterioles, unspecified: Secondary | ICD-10-CM

## 2024-06-08 DIAGNOSIS — E785 Hyperlipidemia, unspecified: Secondary | ICD-10-CM | POA: Insufficient documentation

## 2024-06-08 DIAGNOSIS — Z9861 Coronary angioplasty status: Secondary | ICD-10-CM | POA: Insufficient documentation

## 2024-06-08 DIAGNOSIS — I6523 Occlusion and stenosis of bilateral carotid arteries: Secondary | ICD-10-CM | POA: Insufficient documentation

## 2024-06-08 DIAGNOSIS — I10 Essential (primary) hypertension: Secondary | ICD-10-CM | POA: Diagnosis not present

## 2024-06-08 DIAGNOSIS — I2510 Atherosclerotic heart disease of native coronary artery without angina pectoris: Secondary | ICD-10-CM | POA: Diagnosis not present

## 2024-06-08 HISTORY — DX: Disorder of arteries and arterioles, unspecified: I77.9

## 2024-06-08 MED ORDER — LOSARTAN POTASSIUM 25 MG PO TABS: 25.0000 mg | ORAL_TABLET | Freq: Every day | ORAL | 3 refills | Status: AC

## 2024-06-08 NOTE — Assessment & Plan Note (Signed)
 Asymptomatic. Currently remains on dual antiplatelets aspirin  and Plavix  since his stroke in September 2024.  From cardiac standpoint okay to continue with single antiplatelet agent however would defer this to PCP or neurologist, unclear why he is continued on dual antiplatelet therapy at this time.  Continue with rosuvastatin  40 mg once daily.

## 2024-06-08 NOTE — Assessment & Plan Note (Signed)
 Uncontrolled. Target blood pressure below 130/80 mmHg. Continue amlodipine  10 mg once daily Continue metoprolol  to tartrate 50 mg twice daily Add losartan  25 mg once daily.  Will obtain CMP and CBC for baseline assessment of electrolytes

## 2024-06-08 NOTE — Progress Notes (Signed)
 Cardiology Consultation:    Date:  06/08/2024   ID:  Lance Santiago, DOB 1961-01-25, MRN [id number]  PCP:  Court Dorn PARAS, MD  Cardiologist:  Alean SAUNDERS Sondra Blixt, MD   Referring MD: Jerilynn Daphne SAILOR, NP   No chief complaint on file.    ASSESSMENT AND PLAN:   Mr. Lance Santiago 63 year old male history of coronary artery disease s/p inferior STEMI in 2018 PCI of RCA, mild cardiomyopathy and recovered LVEF, stroke left anterior cerebral artery circulation 04/26/2023 s/p TNK with good neurological recovery without any significant residual defects and stroke felt secondary to stenosis of left internal carotid artery associated with atherosclerosis, carotid artery disease, peripheral vascular disease involving femoral arteries bilaterally [CT aortogram with runoff 04/26/2023], dyslipidemia, hypertension/hypertriglyceridemia, prediabetes, tobacco use, chronic back pain, marijuana use, poor dietary adherence.  Echocardiogram September 2024 LVEF 50 to 55%, grade 1 diastolic dysfunction.   Here for follow-up visit.  Problem List Items Addressed This Visit     Tobacco abuse   Encouraged strongly to further cut down and quit tobacco smoking. He has cut down significantly to half pack a day over the past year. He is currently motivated and is willing to work further on it.       Hypertension   Uncontrolled. Target blood pressure below 130/80 mmHg. Continue amlodipine  10 mg once daily Continue metoprolol  to tartrate 50 mg twice daily Add losartan  25 mg once daily.  Will obtain CMP and CBC for baseline assessment of electrolytes      Relevant Medications   losartan  (COZAAR ) 25 MG tablet   Dyslipidemia   Previously uncontrolled lipid panel from 04/26/2023. No repeat levels available.  Will obtain lipid panel today [he only had black coffee this morning]  Continue rosuvastatin  40 mg once daily. Target LDL less than 70 mg/dL. If not at goal we will add Zetia 10 mg once daily.        Relevant Orders   Lipid panel   CAD S/P percutaneous coronary angioplasty; inferior STEMI September 2018, s/p PCI of RCA. - Primary   Asymptomatic. Currently remains on dual antiplatelets aspirin  and Plavix  since his stroke in September 2024.  From cardiac standpoint okay to continue with single antiplatelet agent however would defer this to PCP or neurologist, unclear why he is continued on dual antiplatelet therapy at this time.  Continue with rosuvastatin  40 mg once daily.       Relevant Medications   losartan  (COZAAR ) 25 MG tablet   Other Relevant Orders   Comprehensive metabolic panel with GFR   CBC   Lipid panel   VAS US  CAROTID   PVD (peripheral vascular disease) with claudication   Has significant bilateral lower extremity peripheral arterial disease and recent CT aortogram with runoff 04/26/2023 abnormal.  However has good flow distally.  Was supposed to follow-up with vascular surgeon which she failed to keep an appointment.  Will refer back to vascular surgeon Garnette to review further monitoring and follow-up.       Relevant Medications   losartan  (COZAAR ) 25 MG tablet   Other Relevant Orders   Comprehensive metabolic panel with GFR   CBC   VAS US  CAROTID   Ambulatory referral to Vascular Surgery   Carotid artery disease   Bilateral carotid artery atherosclerosis with mild stenosis. Follow-up for any significant interval change with repeat ultrasound carotids.      Relevant Medications   losartan  (COZAAR ) 25 MG tablet   Return to clinic tentatively in 3 months.   History  of Present Illness:    Lance Santiago is a 63 y.o. male who is being seen today for follow-up visit. PCP is through [facility] and last visit was over 3 months ago and he missed his follow-up appointment last week. Last visit with me in the office was 06/23/2023.  history of coronary artery disease s/p inferior STEMI in 2018 PCI of RCA, mild cardiomyopathy and  recovered LVEF, stroke left anterior cerebral artery circulation 04/26/2023 s/p TNK with good neurological recovery without any significant residual defects and stroke felt secondary to stenosis of left internal carotid artery associated with atherosclerosis, carotid artery disease, peripheral vascular disease involving femoral arteries bilaterally [CT aortogram with runoff 04/26/2023], dyslipidemia, hypertension/hypertriglyceridemia, prediabetes, tobacco use, chronic back pain, marijuana use, poor dietary adherence.  Echocardiogram September 2024 LVEF 50 to 55%, grade 1 diastolic dysfunction.  Was previously living in her son's place.  Had to move out and lost his phone and transition and failed to keep his follow-up appointments.  Now lives with his brother.  He also has a girlfriend.  They both are able to assist him with transportation for his appointments.  In May 2025 he had an ER visit for atypical burning-like chest pain, was seen at ER at [facility], troponins were unremarkable and symptoms improved after Protonix .  Felt related more due to gastric etiology had discharged home.  Symptoms have been well-controlled since then as he has been using Nexium.  From cardiac standpoint denies any chest pain, shortness of breath, orthopnea or paroxysmal nocturnal dyspnea. Denies any palpitations, lightheadedness, dizziness or syncopal episodes. No regular exercise. Has not had follow-up visit with vascular surgeon to evaluate bilateral lower extremity peripheral artery disease.  Mentions good compliance with his medications. Feels Plavix  is contributing to his erectile dysfunction.  Mentions he has been cutting down on smoking and down to half pack a day now. Does not drink alcohol.  Past Medical History:  Diagnosis Date   Acute ischemic left ACA stroke (HCC) 04/30/2023   Arterial ischemic stroke, ACA (anterior cerebral artery), left, acute (HCC) 04/26/2023   CAD S/P percutaneous coronary  angioplasty; inferior STEMI September 2018, s/p PCI of RCA. 05/10/2017   Total pRCA with L-R collaterals, treated with PCI and DES, no other significant CAD     Coronary artery disease    Dyslipidemia 04/28/2017   Dyslipidemia     Fungal infection 04/19/2018   Gastroesophageal reflux disease 06/16/2023   Hyperlipidemia    Hypertension    Hyponatremia 06/16/2023   Lumbar disc disease    Myocardial infarction (HCC)    9/18 PCI/DESx1 to RCA, EF 45%   PVD (peripheral vascular disease) with claudication 05/10/2017   Pt has LLE PVD and claudication. Previous eval in [facility] 3 years ago with doppler.   Currently his symptoms are not lifestyle limiting     Smoking    Systolic heart failure (HCC)    Tobacco abuse 04/26/2017   Tobacco abuse     Uninsured 05/02/2018   Vitamin D  deficiency 05/07/2023    Past Surgical History:  Procedure Laterality Date   CARDIAC CATHETERIZATION     CORONARY/GRAFT ACUTE MI REVASCULARIZATION N/A 04/25/2017   Procedure: Coronary/Graft Acute MI Revascularization;  Surgeon: Court Dorn PARAS, MD;  Location: [facility] INVASIVE CV LAB;  Service: Cardiovascular;  Laterality: N/A;   KNEE SURGERY     LEFT HEART CATH AND CORONARY ANGIOGRAPHY N/A 04/25/2017   Procedure: LEFT HEART CATH AND CORONARY ANGIOGRAPHY;  Surgeon: Court Dorn PARAS,  MD;  Location: [facility] INVASIVE CV LAB;  Service: Cardiovascular;  Laterality: N/A;   LUMBAR LAMINECTOMY      Current Medications: Current Meds  Medication Sig   acetaminophen  (TYLENOL ) 325 MG tablet Take 1-2 tablets (325-650 mg total) by mouth every 4 (four) hours as needed for mild pain.   amLODipine  (NORVASC ) 10 MG tablet Take 1 tablet (10 mg total) by mouth daily.   aspirin  EC 81 MG tablet Take 1 tablet (81 mg total) by mouth daily. Swallow whole.   clopidogrel  (PLAVIX ) 75 MG tablet Take 1 tablet by mouth once daily   esomeprazole (NEXIUM) 40 MG capsule Take 40 mg by mouth daily.   losartan  (COZAAR ) 25 MG tablet Take 1 tablet (25 mg  total) by mouth daily.   magnesium  oxide (MAG-OX) 400 MG tablet Take 1 tablet (400 mg total) by mouth at bedtime.   metoprolol  tartrate (LOPRESSOR ) 50 MG tablet Take 1 tablet by mouth twice daily   rosuvastatin  (CRESTOR ) 40 MG tablet Take 40 mg by mouth daily.     Allergies:   Penicillins   Social History   Socioeconomic History   Marital status: Divorced    Spouse name: Not on file   Number of children: Not on file   Years of education: Not on file   Highest education level: Not on file  Occupational History   Not on file  Tobacco Use   Smoking status: Every Day    Current packs/day: 1.00    Average packs/day: 1 pack/day for 40.0 years (40.0 ttl pk-yrs)    Types: Cigarettes   Smokeless tobacco: Never  Substance and Sexual Activity   Alcohol use: Not Currently   Drug use: Yes    Types: Marijuana    Comment: once in a while to help him sleep   Sexual activity: Yes  Other Topics Concern   Not on file  Social History Narrative   Not on file   Social Drivers of Health   Financial Resource Strain: Not on file  Food Insecurity: Food Insecurity Present (04/30/2023)   Hunger Vital Sign    Worried About Running Out of Food in the Last Year: Never true    Ran Out of Food in the Last Year: Sometimes true  Transportation Needs: No Transportation Needs (04/30/2023)   PRAPARE - Administrator, Civil Service (Medical): No    Lack of Transportation (Non-Medical): No  Physical Activity: Not on file  Stress: Not on file  Social Connections: Not on file     Family History: The patient's family history includes Coronary artery disease in his father; Diabetes in his mother. ROS:   Please see the history of present illness.    All 14 point review of systems negative except as described per history of present illness.  EKGs/Labs/Other Studies Reviewed:    The following studies were reviewed today:   EKG:       Recent Labs: 06/16/2023: ALT 34 12/10/2023: BUN 19;  Creatinine, Ser 1.33; Hemoglobin 16.7; Platelets 190; Potassium 4.1; Sodium 138  Recent Lipid Panel    Component Value Date/Time   CHOL 261 (H) 04/26/2023 0340   TRIG 281 (H) 04/26/2023 1145   HDL 34 (L) 04/26/2023 0340   CHOLHDL 7.7 04/26/2023 0340   VLDL UNABLE TO CALCULATE IF TRIGLYCERIDE OVER 400 mg/dL 90/83/7975 9659   LDLCALC UNABLE TO CALCULATE IF TRIGLYCERIDE OVER 400 mg/dL 90/83/7975 9659   LDLDIRECT 166 (H) 04/26/2023 0340    Physical Exam:  VS:  BP (!) 142/96   Pulse 64   Ht 5' 7 (1.702 m)   Wt 143 lb 12.8 oz (65.2 kg)   SpO2 93%   BMI 22.52 kg/m     Wt Readings from Last 3 Encounters:  06/08/24 143 lb 12.8 oz (65.2 kg)  12/10/23 156 lb 1.4 oz (70.8 kg)  06/23/23 156 lb (70.8 kg)     GENERAL:  Well nourished, well developed in no acute distress NECK: No JVD; No carotid bruits CARDIAC: RRR, S1 and S2 present, no murmurs, no rubs, no gallops CHEST:  Clear to auscultation without rales, wheezing or rhonchi  Extremities: No pitting pedal edema. Pulses bilaterally symmetric with radial 2+ and dorsalis pedis 1+ NEUROLOGIC:  Alert and oriented x 3  Medication Adjustments/Labs and Tests Ordered: Current medicines are reviewed at length with the patient today.  Concerns regarding medicines are outlined above.  Orders Placed This Encounter  Procedures   Comprehensive metabolic panel with GFR   CBC   Lipid panel   Ambulatory referral to Vascular Surgery   VAS US  CAROTID   Meds ordered this encounter  Medications   losartan  (COZAAR ) 25 MG tablet    Sig: Take 1 tablet (25 mg total) by mouth daily.    Dispense:  90 tablet    Refill:  3    Signed, Kinney Sackmann reddy Llewellyn Choplin, MD, MPH, [facility]. 06/08/2024 10:07 AM    [facility]

## 2024-06-08 NOTE — Assessment & Plan Note (Signed)
 Has significant bilateral lower extremity peripheral arterial disease and recent CT aortogram with runoff 04/26/2023 abnormal.  However has good flow distally.  Was supposed to follow-up with vascular surgeon which she failed to keep an appointment.  Will refer back to vascular surgeon Garnette to review further monitoring and follow-up.

## 2024-06-08 NOTE — Assessment & Plan Note (Signed)
 Encouraged strongly to further cut down and quit tobacco smoking. He has cut down significantly to half pack a day over the past year. He is currently motivated and is willing to work further on it.

## 2024-06-08 NOTE — Patient Instructions (Signed)
 Medication Instructions:  Your physician has recommended you make the following change in your medication:   Start Losartan  25 mg daily.  *If you need a refill on your cardiac medications before your next appointment, please call your pharmacy*   Lab Work: Your physician recommends that you have a CMP, CBC and lipids today in the office.  If you have labs (blood work) drawn today and your tests are completely normal, you will receive your results only by: MyChart Message (if you have MyChart) OR A paper copy in the mail If you have any lab test that is abnormal or we need to change your treatment, we will call you to review the results.   Testing/Procedures: Your physician has requested that you have a carotid duplex. This test is an ultrasound of the carotid arteries in your neck. It looks at blood flow through these arteries that supply the brain with blood. Allow one hour for this exam. There are no restrictions or special instructions.   Follow-Up: At [facility], you and your health needs are our priority.  As part of our continuing mission to provide you with exceptional heart care, we have created designated Provider Care Teams.  These Care Teams include your primary Cardiologist (physician) and Advanced Practice Providers (APPs -  Physician Assistants and Nurse Practitioners) who all work together to provide you with the care you need, when you need it.  We recommend signing up for the patient portal called MyChart.  Sign up information is provided on this After Visit Summary.  MyChart is used to connect with patients for Virtual Visits (Telemedicine).  Patients are able to view lab/test results, encounter notes, upcoming appointments, etc.  Non-urgent messages can be sent to your provider as well.   To learn more about what you can do with MyChart, go to [email or web address].    Your next appointment:   3 month(s)  The format for your next appointment:   In  Person  Provider:   Alean Kobus, MD    Other Instructions none  Important Information About Sugar

## 2024-06-08 NOTE — Assessment & Plan Note (Signed)
 Bilateral carotid artery atherosclerosis with mild stenosis. Follow-up for any significant interval change with repeat ultrasound carotids.

## 2024-06-08 NOTE — Assessment & Plan Note (Signed)
 Previously uncontrolled lipid panel from 04/26/2023. No repeat levels available.  Will obtain lipid panel today [he only had black coffee this morning]  Continue rosuvastatin  40 mg once daily. Target LDL less than 70 mg/dL. If not at goal we will add Zetia 10 mg once daily.

## 2024-06-09 ENCOUNTER — Other Ambulatory Visit: Payer: Self-pay

## 2024-06-09 DIAGNOSIS — I739 Peripheral vascular disease, unspecified: Secondary | ICD-10-CM

## 2024-06-09 LAB — COMPREHENSIVE METABOLIC PANEL WITH GFR
ALT: 17 IU/L (ref 0–44)
AST: 33 IU/L (ref 0–40)
Albumin: 4.5 g/dL (ref 3.8–4.9)
Alkaline Phosphatase: 105 IU/L (ref 47–123)
BUN/Creatinine Ratio: 16 (ref 10–24)
BUN: 20 mg/dL (ref 8–27)
Bilirubin Total: 0.4 mg/dL (ref 0.0–1.2)
CO2: 24 mmol/L (ref 20–29)
Calcium: 9.8 mg/dL (ref 8.6–10.2)
Chloride: 99 mmol/L (ref 96–106)
Creatinine, Ser: 1.28 mg/dL — ABNORMAL HIGH (ref 0.76–1.27)
Globulin, Total: 2.8 g/dL (ref 1.5–4.5)
Glucose: 86 mg/dL (ref 70–99)
Potassium: 4.4 mmol/L (ref 3.5–5.2)
Sodium: 138 mmol/L (ref 134–144)
Total Protein: 7.3 g/dL (ref 6.0–8.5)
eGFR: 64 mL/min/1.73 (ref >59)

## 2024-06-09 LAB — LIPID PANEL
Chol/HDL Ratio: 4.4 ratio (ref 0.0–5.0)
Cholesterol, Total: 170 mg/dL (ref 100–199)
HDL: 39 mg/dL — ABNORMAL LOW (ref >39)
LDL Chol Calc (NIH): 107 mg/dL — ABNORMAL HIGH (ref 0–99)
Triglycerides: 136 mg/dL (ref 0–149)
VLDL Cholesterol Cal: 24 mg/dL (ref 5–40)

## 2024-06-09 LAB — CBC
Hematocrit: 53.6 % — ABNORMAL HIGH (ref 37.5–51.0)
Hemoglobin: 17.3 g/dL (ref 13.0–17.7)
MCH: 29.3 pg (ref 26.6–33.0)
MCHC: 32.3 g/dL (ref 31.5–35.7)
MCV: 91 fL (ref 79–97)
Platelets: 175 x10E3/uL (ref 150–450)
RBC: 5.91 x10E6/uL — ABNORMAL HIGH (ref 4.14–5.80)
RDW: 14.3 % (ref 11.6–15.4)
WBC: 7.4 x10E3/uL (ref 3.4–10.8)

## 2024-06-26 ENCOUNTER — Ambulatory Visit

## 2024-07-13 NOTE — Progress Notes (Deleted)
 Patient ID: Lance Santiago, male   DOB: [birth year], 63 y.o.   MRN: [id number]  Reason for Consult: No chief complaint on file.   Referred by Court Dorn PARAS, MD  Subjective:     HPI Lance Santiago is a 63 y.o. male presenting for evaluation of leg pain. ***  Past Medical History:  Diagnosis Date   Acute ischemic left ACA stroke (HCC) 04/30/2023   Arterial ischemic stroke, ACA (anterior cerebral artery), left, acute (HCC) 04/26/2023   CAD S/P percutaneous coronary angioplasty; inferior STEMI September 2018, s/p PCI of RCA. 05/10/2017   Total pRCA with L-R collaterals, treated with PCI and DES, no other significant CAD     Coronary artery disease    Dyslipidemia 04/28/2017   Dyslipidemia     Fungal infection 04/19/2018   Gastroesophageal reflux disease 06/16/2023   Hyperlipidemia    Hypertension    Hyponatremia 06/16/2023   Lumbar disc disease    Myocardial infarction (HCC)    9/18 PCI/DESx1 to RCA, EF 45%   PVD (peripheral vascular disease) with claudication 05/10/2017   Pt has LLE PVD and claudication. Previous eval in [facility] 3 years ago with doppler.   Currently his symptoms are not lifestyle limiting     Smoking    Systolic heart failure (HCC)    Tobacco abuse 04/26/2017   Tobacco abuse     Uninsured 05/02/2018   Vitamin D  deficiency 05/07/2023   Family History  Problem Relation Age of Onset   Diabetes Mother    Coronary artery disease Father    Past Surgical History:  Procedure Laterality Date   CARDIAC CATHETERIZATION     CORONARY/GRAFT ACUTE MI REVASCULARIZATION N/A 04/25/2017   Procedure: Coronary/Graft Acute MI Revascularization;  Surgeon: Court Dorn PARAS, MD;  Location: [facility];  Service: Cardiovascular;  Laterality: N/A;   KNEE SURGERY     LEFT HEART CATH AND CORONARY ANGIOGRAPHY N/A 04/25/2017   Procedure: LEFT HEART CATH AND CORONARY ANGIOGRAPHY;  Surgeon: Court Dorn PARAS, MD;  Location: [facility];  Service:  Cardiovascular;  Laterality: N/A;   LUMBAR LAMINECTOMY      Short Social History:  Social History   Tobacco Use   Smoking status: Every Day    Current packs/day: 1.00    Average packs/day: 1 pack/day for 40.0 years (40.0 ttl pk-yrs)    Types: Cigarettes   Smokeless tobacco: Never  Substance Use Topics   Alcohol use: Not Currently    Allergies  Allergen Reactions   Penicillins Other (See Comments)    Causes sore throat Has patient had a PCN reaction causing immediate rash, facial/tongue/throat swelling, SOB or lightheadedness with hypotension: No Has patient had a PCN reaction causing severe rash involving mucus membranes or skin necrosis: No Has patient had a PCN reaction that required hospitalization: No Has patient had a PCN reaction occurring within the last 10 years: No If all of the above answers are NO, then may proceed with Cephalosporin use.    Current Outpatient Medications  Medication Sig Dispense Refill   acetaminophen  (TYLENOL ) 325 MG tablet Take 1-2 tablets (325-650 mg total) by mouth every 4 (four) hours as needed for mild pain.     amLODipine  (NORVASC ) 10 MG tablet Take 1 tablet (10 mg total) by mouth daily. 90 tablet 1   aspirin  EC 81 MG tablet Take 1 tablet (81 mg total) by mouth daily. Swallow whole. 120 tablet 0   clopidogrel  (PLAVIX ) 75 MG tablet Take 1  tablet by mouth once daily 30 tablet 0   esomeprazole (NEXIUM) 40 MG capsule Take 40 mg by mouth daily.     losartan  (COZAAR ) 25 MG tablet Take 1 tablet (25 mg total) by mouth daily. 90 tablet 3   magnesium  oxide (MAG-OX) 400 MG tablet Take 1 tablet (400 mg total) by mouth at bedtime. 120 tablet 0   metoprolol  tartrate (LOPRESSOR ) 50 MG tablet Take 1 tablet by mouth twice daily 60 tablet 0   rosuvastatin  (CRESTOR ) 40 MG tablet Take 40 mg by mouth daily.     No current facility-administered medications for this visit.    REVIEW OF SYSTEMS  All other systems were reviewed and are negative      Objective:  Objective   There were no vitals filed for this visit. There is no height or weight on file to calculate BMI.  Physical Exam General: no acute distress Cardiac: hemodynamically stable Abdomen: non-tender, no pulsatile mass*** Extremities: no edema, cyanosis or wounds*** Vascular:   Right: ***  Left: ***  Data: ABI ***  Carotid duplex from September 2024 reviewed. Right 40 to 59% Left no flow-limiting stenosis  CTA from September 2024 reviewed Right: Right common iliac with calcific stenosis.  Right common femoral with significant stenosis.  There appears to be three-vessel runoff Left: Left common iliac severe stenosis versus occlusion.  Widely patent common femoral and three-vessel runoff.  Echo from 2024 reviewed EF 50 to 55%, RV SF normal, no significant valvular abnormalities     Assessment/Plan:   Lance Santiago is a 63 y.o. male with ***  Recommendations to optimize cardiovascular risk: Abstinence from all tobacco products. Blood glucose control with goal A1c < 7%. Blood pressure control with goal blood pressure < 140/90 mmHg. Lipid reduction therapy with goal LDL-C <55 mg/dL  Aspirin  81mg  PO QD.  Atorvastatin  40-80mg  PO QD (or other high intensity statin therapy).   Lance GORMAN Serve MD [facility]

## 2024-07-14 ENCOUNTER — Ambulatory Visit (HOSPITAL_COMMUNITY): Admission: RE | Admit: 2024-07-14 | Source: Ambulatory Visit

## 2024-07-14 ENCOUNTER — Ambulatory Visit: Attending: Vascular Surgery | Admitting: Vascular Surgery

## 2024-09-07 ENCOUNTER — Ambulatory Visit

## 2024-09-07 ENCOUNTER — Ambulatory Visit: Payer: Self-pay

## 2024-09-07 NOTE — Progress Notes (Unsigned)
 "  Cardiology Consultation:    Date:  09/07/2024   ID:  Lance Santiago, DOB 1960-10-28, MRN [id number]  PCP:  Court Dorn PARAS, MD  Cardiologist:  Lance SAUNDERS Thorsten Climer, MD   Referring MD: Court Dorn PARAS, MD   No chief complaint on file.    ASSESSMENT AND PLAN:   Lance Santiago 64 year old male Problem List Items Addressed This Visit   None     History of Present Illness:    Lance Santiago is a 64 y.o. male who is being seen today for follow-up visit. PCP is Court Dorn PARAS, MD. Last visit with me in the office was 06/08/2024.  Now lives with his brother. He also has a girlfriend. They both are able to assist him with transportation for his appointments.   Has history of CAD s/p inferior STEMI in 2018 PCI of RCA, mild cardiomyopathy with recovered LVEF, CVA left anterior cerebral artery circulation 04/26/2023 [s/p TNK with good neurological recovery without significant residual defects; was felt secondary to left carotid artery atherosclerosis], carotid artery stenosis [last ultrasound September 2024 internal carotids right 40 to 59% stenosis in left less than 40% stenosis], peripheral artery disease involving femoral arteries bilaterally, hypertension, hyperlipidemia/hypertriglyceridemia, prediabetes, chronic back pain, tobacco use, marijuana use, podiatry recommendation adherence. Echocardiogram September 2024 with LVEF 50 to 55% and grade 1 diastolic dysfunction. Repeat ultrasound carotids previously ordered by me pending. Repeat vascular ultrasound ABI ordered by Dr. Pearline from vascular surgery, pending. No-show to the vascular surgery referral previously placed.  Lab work from 06/08/2024 notes unremarkable CBC. BUN 20, creatinine 1.28, eGFR 64. Normal transaminases and alkaline phosphatase Lipid panel 06/08/2024 notes total cholesterol 170, triglycerides 136, HDL 39 and LDL 107. Suboptimal. Plan to add Zetia 10 mg once daily.    Past Medical History:   Diagnosis Date   Acute ischemic left ACA stroke (HCC) 04/30/2023   Arterial ischemic stroke, ACA (anterior cerebral artery), left, acute (HCC) 04/26/2023   CAD S/P percutaneous coronary angioplasty; inferior STEMI September 2018, s/p PCI of RCA. 05/10/2017   Total pRCA with L-R collaterals, treated with PCI and DES, no other significant CAD     Carotid artery disease 06/08/2024   Coronary artery disease    Dyslipidemia 04/28/2017   Dyslipidemia     Fungal infection 04/19/2018   Gastroesophageal reflux disease 06/16/2023   Hyperlipidemia    Hypertension    Hyponatremia 06/16/2023   Lumbar disc disease    Myocardial infarction (HCC)    9/18 PCI/DESx1 to RCA, EF 45%   PVD (peripheral vascular disease) with claudication 05/10/2017   Pt has LLE PVD and claudication. Previous eval in [facility] 3 years ago with doppler.   Currently his symptoms are not lifestyle limiting     Smoking    Systolic heart failure (HCC)    Tobacco abuse 04/26/2017   Tobacco abuse     Uninsured 05/02/2018   Vitamin D  deficiency 05/07/2023    Past Surgical History:  Procedure Laterality Date   CARDIAC CATHETERIZATION     CORONARY/GRAFT ACUTE MI REVASCULARIZATION N/A 04/25/2017   Procedure: Coronary/Graft Acute MI Revascularization;  Surgeon: Court Dorn PARAS, MD;  Location: [facility];  Service: Cardiovascular;  Laterality: N/A;   KNEE SURGERY     LEFT HEART CATH AND CORONARY ANGIOGRAPHY N/A 04/25/2017   Procedure: LEFT HEART CATH AND CORONARY ANGIOGRAPHY;  Surgeon: Court Dorn PARAS, MD;  Location: [facility];  Service: Cardiovascular;  Laterality: N/A;   LUMBAR LAMINECTOMY  Current Medications: Active Medications[1]   Allergies:   Penicillins   Social History   Socioeconomic History   Marital status: Divorced    Spouse name: Not on file   Number of children: Not on file   Years of education: Not on file   Highest education level: Not on file  Occupational History   Not on  file  Tobacco Use   Smoking status: Every Day    Current packs/day: 1.00    Average packs/day: 1 pack/day for 40.0 years (40.0 ttl pk-yrs)    Types: Cigarettes   Smokeless tobacco: Never  Substance and Sexual Activity   Alcohol use: Not Currently   Drug use: Yes    Types: Marijuana    Comment: once in a while to help him sleep   Sexual activity: Yes  Other Topics Concern   Not on file  Social History Narrative   Not on file   Social Drivers of Health   Tobacco Use: High Risk (06/08/2024)   Patient History    Smoking Tobacco Use: Every Day    Smokeless Tobacco Use: Never    Passive Exposure: Not on file  Financial Resource Strain: Not on file  Food Insecurity: Food Insecurity Present (04/30/2023)   Hunger Vital Sign    Worried About Running Out of Food in the Last Year: Never true    Ran Out of Food in the Last Year: Sometimes true  Transportation Needs: No Transportation Needs (04/30/2023)   PRAPARE - Administrator, Civil Service (Medical): No    Lack of Transportation (Non-Medical): No  Physical Activity: Not on file  Stress: Not on file  Social Connections: Not on file  Depression (EYV7-0): Not on file  Alcohol Screen: Not on file  Housing: Low Risk (04/30/2023)   Housing    Last Housing Risk Score: 0  Utilities: Not At Risk (04/30/2023)   AHC Utilities    Threatened with loss of utilities: No  Health Literacy: Not on file     Family History: The patient's family history includes Coronary artery disease in his father; Diabetes in his mother. ROS:   Please see the history of present illness.    All 14 point review of systems negative except as described per history of present illness.  EKGs/Labs/Other Studies Reviewed:    The following studies were reviewed today:   EKG:       Recent Labs: 06/08/2024: ALT 17; BUN 20; Creatinine, Ser 1.28; Hemoglobin 17.3; Platelets 175; Potassium 4.4; Sodium 138  Recent Lipid Panel    Component Value Date/Time    CHOL 170 06/08/2024 1013   TRIG 136 06/08/2024 1013   HDL 39 (L) 06/08/2024 1013   CHOLHDL 4.4 06/08/2024 1013   CHOLHDL 7.7 04/26/2023 0340   VLDL UNABLE TO CALCULATE IF TRIGLYCERIDE OVER 400 mg/dL 90/83/7975 9659   LDLCALC 107 (H) 06/08/2024 1013   LDLDIRECT 166 (H) 04/26/2023 0340    Physical Exam:    VS:  There were no vitals taken for this visit.    Wt Readings from Last 3 Encounters:  06/08/24 143 lb 12.8 oz (65.2 kg)  12/10/23 156 lb 1.4 oz (70.8 kg)  06/23/23 156 lb (70.8 kg)     GENERAL:  Well nourished, well developed in no acute distress NECK: No JVD; No carotid bruits CARDIAC: RRR, S1 and S2 present, no murmurs, no rubs, no gallops CHEST:  Clear to auscultation without rales, wheezing or rhonchi  Extremities: No pitting pedal edema. Pulses bilaterally  symmetric with radial 2+ and dorsalis pedis 2+ NEUROLOGIC:  Alert and oriented x 3  Medication Adjustments/Labs and Tests Ordered: Current medicines are reviewed at length with the patient today.  Concerns regarding medicines are outlined above.  No orders of the defined types were placed in this encounter.  No orders of the defined types were placed in this encounter.   Signed, Lance Quilter reddy Karis Rilling, MD, MPH, [facility]. 09/07/2024 9:49 AM    [facility]    [1]  No outpatient medications have been marked as taking for the 09/07/24 encounter (Appointment) with Lance Santiago, Lance SAUNDERS, MD.   "
